# Patient Record
Sex: Male | Born: 1950 | Race: White | Hispanic: No | Smoking: Never smoker
Health system: Southern US, Community
[De-identification: ages and names within clinical notes are randomized; demographics above are authoritative.]

## PROBLEM LIST (undated history)

## (undated) DIAGNOSIS — I10 Essential (primary) hypertension: Secondary | ICD-10-CM

## (undated) DIAGNOSIS — E119 Type 2 diabetes mellitus without complications: Secondary | ICD-10-CM

## (undated) DIAGNOSIS — N429 Disorder of prostate, unspecified: Secondary | ICD-10-CM

## (undated) DIAGNOSIS — F209 Schizophrenia, unspecified: Secondary | ICD-10-CM

## (undated) DIAGNOSIS — E785 Hyperlipidemia, unspecified: Secondary | ICD-10-CM

## (undated) DIAGNOSIS — R569 Unspecified convulsions: Secondary | ICD-10-CM

## (undated) DIAGNOSIS — I5030 Unspecified diastolic (congestive) heart failure: Secondary | ICD-10-CM

---

## 2016-02-21 DIAGNOSIS — M545 Low back pain: Secondary | ICD-10-CM

## 2016-02-21 DIAGNOSIS — G8929 Other chronic pain: Secondary | ICD-10-CM | POA: Diagnosis present

## 2016-11-16 ENCOUNTER — Emergency Department (HOSPITAL_COMMUNITY): Payer: Medicare Other

## 2016-11-16 ENCOUNTER — Inpatient Hospital Stay (HOSPITAL_COMMUNITY)
Admission: EM | Admit: 2016-11-16 | Discharge: 2016-11-18 | DRG: 872 | Disposition: A | Discharge status: Home / Self Care | Payer: Medicare Other | Attending: Internal Medicine | Admitting: Internal Medicine

## 2016-11-16 ENCOUNTER — Encounter (HOSPITAL_COMMUNITY): Payer: Self-pay | Admitting: Emergency Medicine

## 2016-11-16 DIAGNOSIS — W1830XA Fall on same level, unspecified, initial encounter: Secondary | ICD-10-CM | POA: Diagnosis present

## 2016-11-16 DIAGNOSIS — Y92238 Other place in hospital as the place of occurrence of the external cause: Secondary | ICD-10-CM | POA: Diagnosis not present

## 2016-11-16 DIAGNOSIS — Z6834 Body mass index (BMI) 34.0-34.9, adult: Secondary | ICD-10-CM

## 2016-11-16 DIAGNOSIS — E1159 Type 2 diabetes mellitus with other circulatory complications: Secondary | ICD-10-CM | POA: Diagnosis present

## 2016-11-16 DIAGNOSIS — R197 Diarrhea, unspecified: Secondary | ICD-10-CM | POA: Diagnosis not present

## 2016-11-16 DIAGNOSIS — E860 Dehydration: Secondary | ICD-10-CM | POA: Diagnosis present

## 2016-11-16 DIAGNOSIS — E1140 Type 2 diabetes mellitus with diabetic neuropathy, unspecified: Secondary | ICD-10-CM | POA: Diagnosis present

## 2016-11-16 DIAGNOSIS — Z79899 Other long term (current) drug therapy: Secondary | ICD-10-CM | POA: Diagnosis not present

## 2016-11-16 DIAGNOSIS — E669 Obesity, unspecified: Secondary | ICD-10-CM

## 2016-11-16 DIAGNOSIS — R55 Syncope and collapse: Secondary | ICD-10-CM

## 2016-11-16 DIAGNOSIS — W19XXXA Unspecified fall, initial encounter: Secondary | ICD-10-CM | POA: Diagnosis not present

## 2016-11-16 DIAGNOSIS — E1169 Type 2 diabetes mellitus with other specified complication: Secondary | ICD-10-CM | POA: Diagnosis present

## 2016-11-16 DIAGNOSIS — E785 Hyperlipidemia, unspecified: Secondary | ICD-10-CM | POA: Diagnosis present

## 2016-11-16 DIAGNOSIS — Y9301 Activity, walking, marching and hiking: Secondary | ICD-10-CM | POA: Diagnosis present

## 2016-11-16 DIAGNOSIS — IMO0001 Reserved for inherently not codable concepts without codable children: Secondary | ICD-10-CM | POA: Diagnosis present

## 2016-11-16 DIAGNOSIS — R0902 Hypoxemia: Secondary | ICD-10-CM | POA: Diagnosis present

## 2016-11-16 DIAGNOSIS — G8929 Other chronic pain: Secondary | ICD-10-CM | POA: Diagnosis present

## 2016-11-16 DIAGNOSIS — E876 Hypokalemia: Secondary | ICD-10-CM | POA: Diagnosis present

## 2016-11-16 DIAGNOSIS — R2681 Unsteadiness on feet: Secondary | ICD-10-CM | POA: Diagnosis present

## 2016-11-16 DIAGNOSIS — A419 Sepsis, unspecified organism: Principal | ICD-10-CM | POA: Diagnosis present

## 2016-11-16 DIAGNOSIS — M545 Low back pain, unspecified: Secondary | ICD-10-CM | POA: Diagnosis present

## 2016-11-16 DIAGNOSIS — Z7984 Long term (current) use of oral hypoglycemic drugs: Secondary | ICD-10-CM

## 2016-11-16 DIAGNOSIS — A0811 Acute gastroenteropathy due to Norwalk agent: Secondary | ICD-10-CM | POA: Diagnosis present

## 2016-11-16 DIAGNOSIS — S2243XA Multiple fractures of ribs, bilateral, initial encounter for closed fracture: Secondary | ICD-10-CM | POA: Diagnosis present

## 2016-11-16 DIAGNOSIS — I10 Essential (primary) hypertension: Secondary | ICD-10-CM | POA: Diagnosis present

## 2016-11-16 DIAGNOSIS — Y92481 Parking lot as the place of occurrence of the external cause: Secondary | ICD-10-CM

## 2016-11-16 DIAGNOSIS — S0081XA Abrasion of other part of head, initial encounter: Secondary | ICD-10-CM | POA: Diagnosis present

## 2016-11-16 DIAGNOSIS — F209 Schizophrenia, unspecified: Secondary | ICD-10-CM | POA: Diagnosis present

## 2016-11-16 DIAGNOSIS — E118 Type 2 diabetes mellitus with unspecified complications: Secondary | ICD-10-CM

## 2016-11-16 DIAGNOSIS — A09 Infectious gastroenteritis and colitis, unspecified: Secondary | ICD-10-CM | POA: Diagnosis not present

## 2016-11-16 DIAGNOSIS — E119 Type 2 diabetes mellitus without complications: Secondary | ICD-10-CM | POA: Insufficient documentation

## 2016-11-16 DIAGNOSIS — S60221A Contusion of right hand, initial encounter: Secondary | ICD-10-CM | POA: Diagnosis present

## 2016-11-16 HISTORY — DX: Unspecified convulsions: R56.9

## 2016-11-16 HISTORY — DX: Schizophrenia, unspecified: F20.9

## 2016-11-16 HISTORY — DX: Essential (primary) hypertension: I10

## 2016-11-16 LAB — TROPONIN I
Troponin I: <0.03 ng/mL (ref <0.03)
Troponin I: <0.03 ng/mL (ref <0.03)

## 2016-11-16 LAB — APTT: APTT: 31 s (ref 24–36)

## 2016-11-16 LAB — RAPID URINE DRUG SCREEN, HOSP PERFORMED
Amphetamines: NOT DETECTED
Barbiturates: NOT DETECTED
Benzodiazepines: NOT DETECTED
Cocaine: NOT DETECTED
OPIATES: NOT DETECTED
Tetrahydrocannabinol: NOT DETECTED

## 2016-11-16 LAB — COMPREHENSIVE METABOLIC PANEL
ALBUMIN: 3.6 g/dL (ref 3.5–5.0)
ALK PHOS: 67 U/L (ref 38–126)
ALT: 37 U/L (ref 17–63)
ANION GAP: 14 (ref 5–15)
AST: 39 U/L (ref 15–41)
BILIRUBIN TOTAL: 0.6 mg/dL (ref 0.3–1.2)
BUN: 17 mg/dL (ref 6–20)
CALCIUM: 8.5 mg/dL — AB (ref 8.9–10.3)
CO2: 16 mmol/L — ABNORMAL LOW (ref 22–32)
Chloride: 105 mmol/L (ref 101–111)
Creatinine, Ser: 1.16 mg/dL (ref 0.61–1.24)
GFR calc Af Amer: >60 mL/min (ref >60)
GLUCOSE: 220 mg/dL — AB (ref 65–99)
POTASSIUM: 4.8 mmol/L (ref 3.5–5.1)
Sodium: 135 mmol/L (ref 135–145)
TOTAL PROTEIN: 6.6 g/dL (ref 6.5–8.1)

## 2016-11-16 LAB — CBC
HEMATOCRIT: 43 % (ref 39.0–52.0)
HEMOGLOBIN: 14.4 g/dL (ref 13.0–17.0)
MCH: 29.3 pg (ref 26.0–34.0)
MCHC: 33.5 g/dL (ref 30.0–36.0)
MCV: 87.4 fL (ref 78.0–100.0)
Platelets: 299 10*3/uL (ref 150–400)
RBC: 4.92 MIL/uL (ref 4.22–5.81)
RDW: 14.4 % (ref 11.5–15.5)
WBC: 21.7 10*3/uL — ABNORMAL HIGH (ref 4.0–10.5)

## 2016-11-16 LAB — INFLUENZA PANEL BY PCR (TYPE A & B)
Influenza A By PCR: NEGATIVE
Influenza B By PCR: NEGATIVE

## 2016-11-16 LAB — URINALYSIS, ROUTINE W REFLEX MICROSCOPIC
Bilirubin Urine: NEGATIVE
Glucose, UA: 150 mg/dL — AB
HGB URINE DIPSTICK: NEGATIVE
KETONES UR: 5 mg/dL — AB
Leukocytes, UA: NEGATIVE
Nitrite: NEGATIVE
PROTEIN: NEGATIVE mg/dL
Specific Gravity, Urine: 1.023 (ref 1.005–1.030)
pH: 5 (ref 5.0–8.0)

## 2016-11-16 LAB — I-STAT CG4 LACTIC ACID, ED
LACTIC ACID, VENOUS: 2.79 mmol/L — AB (ref 0.5–1.9)
Lactic Acid, Venous: 5.32 mmol/L (ref 0.5–1.9)

## 2016-11-16 LAB — LACTIC ACID, PLASMA
LACTIC ACID, VENOUS: 2.8 mmol/L — AB (ref 0.5–1.9)
LACTIC ACID, VENOUS: 2.9 mmol/L — AB (ref 0.5–1.9)

## 2016-11-16 LAB — TSH: TSH: 1.453 u[IU]/mL (ref 0.350–4.500)

## 2016-11-16 LAB — C DIFFICILE QUICK SCREEN W PCR REFLEX
C DIFFICILE (CDIFF) INTERP: NOT DETECTED
C DIFFICILE (CDIFF) TOXIN: NEGATIVE
C DIFFICLE (CDIFF) ANTIGEN: NEGATIVE

## 2016-11-16 LAB — PHENYTOIN LEVEL, TOTAL

## 2016-11-16 LAB — MRSA PCR SCREENING: MRSA by PCR: NEGATIVE

## 2016-11-16 LAB — PROTIME-INR
INR: 1.15
PROTHROMBIN TIME: 14.7 s (ref 11.4–15.2)

## 2016-11-16 LAB — PROCALCITONIN: PROCALCITONIN: 0.54 ng/mL

## 2016-11-16 LAB — CBG MONITORING, ED: GLUCOSE-CAPILLARY: 150 mg/dL — AB (ref 65–99)

## 2016-11-16 MED ORDER — SENNOSIDES-DOCUSATE SODIUM 8.6-50 MG PO TABS: 1.0000 | ORAL_TABLET | Freq: Every evening | ORAL | Status: DC | PRN

## 2016-11-16 MED ORDER — FLEET ENEMA 7-19 GM/118ML RE ENEM: 1.0000 | ENEMA | Freq: Once | RECTAL | Status: DC | PRN

## 2016-11-16 MED ORDER — SODIUM CHLORIDE 0.9 % IV BOLUS (SEPSIS)
1000.0000 mL | Freq: Once | INTRAVENOUS | Status: AC
  Administered 2016-11-16: 1000 mL via INTRAVENOUS

## 2016-11-16 MED ORDER — BENZTROPINE MESYLATE 0.5 MG PO TABS
0.5000 mg | ORAL_TABLET | Freq: Every day | ORAL | Status: DC
  Administered 2016-11-17 – 2016-11-18 (×2): 0.5 mg via ORAL
  Filled 2016-11-16 (×2): qty 1

## 2016-11-16 MED ORDER — ACETAMINOPHEN 650 MG RE SUPP: 650.0000 mg | Freq: Four times a day (QID) | RECTAL | Status: DC | PRN

## 2016-11-16 MED ORDER — VANCOMYCIN HCL 10 G IV SOLR
2000.0000 mg | Freq: Once | INTRAVENOUS | Status: DC
  Filled 2016-11-16: qty 2000

## 2016-11-16 MED ORDER — ACETAMINOPHEN 325 MG PO TABS: 650.0000 mg | ORAL_TABLET | Freq: Four times a day (QID) | ORAL | Status: DC | PRN

## 2016-11-16 MED ORDER — HYDROMORPHONE HCL 2 MG/ML IJ SOLN: 1.0000 mg | INTRAMUSCULAR | Status: DC | PRN

## 2016-11-16 MED ORDER — ONDANSETRON HCL 4 MG/2ML IJ SOLN: 4.0000 mg | Freq: Four times a day (QID) | INTRAMUSCULAR | Status: DC | PRN

## 2016-11-16 MED ORDER — ATORVASTATIN CALCIUM 20 MG PO TABS
20.0000 mg | ORAL_TABLET | Freq: Every day | ORAL | Status: DC
  Administered 2016-11-17 – 2016-11-18 (×2): 20 mg via ORAL
  Filled 2016-11-16 (×2): qty 1

## 2016-11-16 MED ORDER — VANCOMYCIN HCL IN DEXTROSE 1-5 GM/200ML-% IV SOLN
1000.0000 mg | Freq: Once | INTRAVENOUS | Status: AC
  Administered 2016-11-16: 1000 mg via INTRAVENOUS
  Filled 2016-11-16: qty 200

## 2016-11-16 MED ORDER — HYDROMORPHONE HCL 1 MG/ML IJ SOLN: 1.0000 mg | INTRAMUSCULAR | Status: DC | PRN

## 2016-11-16 MED ORDER — GABAPENTIN 300 MG PO CAPS
300.0000 mg | ORAL_CAPSULE | Freq: Three times a day (TID) | ORAL | Status: DC
  Administered 2016-11-16 – 2016-11-18 (×7): 300 mg via ORAL
  Filled 2016-11-16 (×7): qty 1

## 2016-11-16 MED ORDER — VANCOMYCIN HCL IN DEXTROSE 1-5 GM/200ML-% IV SOLN: 1000.0000 mg | Freq: Once | INTRAVENOUS | Status: DC

## 2016-11-16 MED ORDER — BACID PO TABS
2.0000 | ORAL_TABLET | Freq: Three times a day (TID) | ORAL | Status: DC
  Administered 2016-11-16 – 2016-11-18 (×6): 2 via ORAL
  Filled 2016-11-16 (×7): qty 2

## 2016-11-16 MED ORDER — LISINOPRIL 20 MG PO TABS: 40.0000 mg | ORAL_TABLET | Freq: Every day | ORAL | Status: DC

## 2016-11-16 MED ORDER — ENOXAPARIN SODIUM 40 MG/0.4ML ~~LOC~~ SOLN
40.0000 mg | SUBCUTANEOUS | Status: DC
  Administered 2016-11-17 – 2016-11-18 (×2): 40 mg via SUBCUTANEOUS
  Filled 2016-11-16 (×3): qty 0.4

## 2016-11-16 MED ORDER — AMLODIPINE BESYLATE 5 MG PO TABS: 10.0000 mg | ORAL_TABLET | Freq: Every day | ORAL | Status: DC

## 2016-11-16 MED ORDER — ALUM & MAG HYDROXIDE-SIMETH 200-200-20 MG/5ML PO SUSP: 30.0000 mL | Freq: Four times a day (QID) | ORAL | Status: DC | PRN

## 2016-11-16 MED ORDER — BISACODYL 5 MG PO TBEC: 5.0000 mg | DELAYED_RELEASE_TABLET | Freq: Every day | ORAL | Status: DC | PRN

## 2016-11-16 MED ORDER — BENZTROPINE MESYLATE 1 MG PO TABS: 0.5000 mg | ORAL_TABLET | Freq: Every day | ORAL | Status: DC

## 2016-11-16 MED ORDER — SODIUM CHLORIDE 0.9 % IV BOLUS (SEPSIS): 1000.0000 mL | Freq: Once | INTRAVENOUS | Status: DC

## 2016-11-16 MED ORDER — ONDANSETRON HCL 4 MG PO TABS: 4.0000 mg | ORAL_TABLET | Freq: Four times a day (QID) | ORAL | Status: DC | PRN

## 2016-11-16 MED ORDER — FLUPHENAZINE HCL 5 MG PO TABS
5.0000 mg | ORAL_TABLET | Freq: Every day | ORAL | Status: DC
  Administered 2016-11-16 – 2016-11-17 (×2): 5 mg via ORAL
  Filled 2016-11-16 (×3): qty 1

## 2016-11-16 MED ORDER — PIPERACILLIN-TAZOBACTAM 3.375 G IVPB 30 MIN: 3.3750 g | Freq: Once | INTRAVENOUS | Status: DC

## 2016-11-16 MED ORDER — HYDROCODONE-ACETAMINOPHEN 5-325 MG PO TABS: 1.0000 | ORAL_TABLET | ORAL | Status: DC | PRN

## 2016-11-16 MED ORDER — PIPERACILLIN-TAZOBACTAM 3.375 G IVPB
3.3750 g | Freq: Three times a day (TID) | INTRAVENOUS | Status: DC
  Administered 2016-11-17 (×3): 3.375 g via INTRAVENOUS
  Filled 2016-11-16 (×4): qty 50

## 2016-11-16 MED ORDER — VANCOMYCIN HCL IN DEXTROSE 750-5 MG/150ML-% IV SOLN
750.0000 mg | Freq: Two times a day (BID) | INTRAVENOUS | Status: DC
  Administered 2016-11-16 – 2016-11-17 (×2): 750 mg via INTRAVENOUS
  Filled 2016-11-16 (×3): qty 150

## 2016-11-16 MED ORDER — SODIUM CHLORIDE 0.9 % IV BOLUS (SEPSIS): 500.0000 mL | Freq: Once | INTRAVENOUS | Status: DC

## 2016-11-16 MED ORDER — PIPERACILLIN-TAZOBACTAM 3.375 G IVPB: 3.3750 g | Freq: Three times a day (TID) | INTRAVENOUS | Status: DC

## 2016-11-16 MED ORDER — TRAZODONE HCL 50 MG PO TABS
100.0000 mg | ORAL_TABLET | Freq: Every day | ORAL | Status: DC
  Administered 2016-11-16 – 2016-11-17 (×2): 100 mg via ORAL
  Filled 2016-11-16 (×2): qty 2

## 2016-11-16 MED ORDER — LISINOPRIL 20 MG PO TABS
40.0000 mg | ORAL_TABLET | Freq: Every day | ORAL | Status: DC
  Administered 2016-11-17 – 2016-11-18 (×2): 40 mg via ORAL
  Filled 2016-11-16 (×2): qty 2

## 2016-11-16 MED ORDER — AMLODIPINE BESYLATE 10 MG PO TABS
10.0000 mg | ORAL_TABLET | Freq: Every day | ORAL | Status: DC
  Administered 2016-11-17 – 2016-11-18 (×2): 10 mg via ORAL
  Filled 2016-11-16 (×2): qty 1

## 2016-11-16 MED ORDER — PIPERACILLIN-TAZOBACTAM 3.375 G IVPB
3.3750 g | Freq: Once | INTRAVENOUS | Status: AC
  Administered 2016-11-16: 3.375 g via INTRAVENOUS
  Filled 2016-11-16: qty 50

## 2016-11-16 MED ORDER — SODIUM CHLORIDE 0.9 % IV BOLUS (SEPSIS)
500.0000 mL | Freq: Once | INTRAVENOUS | Status: AC
  Administered 2016-11-16: 500 mL via INTRAVENOUS

## 2016-11-16 MED ORDER — INSULIN ASPART 100 UNIT/ML ~~LOC~~ SOLN
0.0000 [IU] | Freq: Three times a day (TID) | SUBCUTANEOUS | Status: DC
  Administered 2016-11-16: 1 [IU] via SUBCUTANEOUS
  Administered 2016-11-17 (×2): 2 [IU] via SUBCUTANEOUS
  Administered 2016-11-17: 1 [IU] via SUBCUTANEOUS
  Administered 2016-11-18: 3 [IU] via SUBCUTANEOUS
  Administered 2016-11-18: 5 [IU] via SUBCUTANEOUS
  Filled 2016-11-16: qty 1

## 2016-11-16 NOTE — ED Notes (Signed)
Dinner tray ordered.

## 2016-11-16 NOTE — H&P (Signed)
History and Physical    Jaime Lopez ZOX:[id number] DOB: 1950-11-14 DOA: 11/16/2016   PCP: Jolene Provost, MD   Patient coming from:  Home    Chief Complaint: Dizziness, Fall   HPI: Jaime Lopez is a 66 y.o. male  with a history of hypertension, with a history of hypertension hyperlipidemia, schizophrenia, remote history of seizures 10 years ago not on medication, brought via EMS after sustaining a fault. In review, she was walking across the parking lot at the grocery store, when he reported becoming dizzy with vertigo and falling on his right side of the face. The patient recalls the moments prior to falling, not the time spent on the ground, which may have been around 1 minute. This was witnessed by his brother. He denies any similar episodes in the past. He has never been seen by cardiology, or had any cardiac catheterization or echocardiogram. He denies any headache, vision changes, Denies nasal congestion, call for sore throat. He denies any vomiting. He denies any subjective fevers. Brother reports that in his living facility, it has been an outbreak of [place], unknown if viral or bacterial. Patient himself had 4 episodes of diarrhea, non bloody, without further episodes in the ED. Denies dysuria or gross hematuria. Denies any recent illness. He is compliant with his meds. Appetite and liquid intake had been decreased over the last 48 hrs. NO recent long distance trips. No recreational drugs.    ED Course:  BP 145/70   Pulse (!) 123   Temp 100.8 F (38.2 C) (Oral)   Resp (!) 27   Ht 5\' 10"  (1.778 m)   Wt 108 kg (238 lb)   SpO2 93%   BMI 34.15 kg/m    On presentation , Rectal temperature,  100.8 F. He was also noted to be tachycardic at 128 to 130 bpm. Because of this findings, he was placed on sepsis protocol.  Lactic acid was 5.32.  He was very dehydrated. He received up to 2 L of IV normal saline, and after cultures were obtained, he was placed on Vanc and Zosyn . Sunday  135 potassium 4.8 creatinine 1.16 glucose 220, Anion Gap 14  bilirubin 0.6 l  white count 21.7 hemoglobin 14.4 platelets 299  Dilantin level is less than 2.5 glucose 220 CXR NAD CT head negative for intracranial findings.   Review of Systems: As per HPI otherwise 10 point review of systems negative.   Past Medical History:  Diagnosis Date  . Hypertension   . Schizophrenia (HCC)   . Seizures (HCC)     No past surgical history on file.  Social History Social History   Social History  . Marital status: Unknown    Spouse name: N/A  . Number of children: N/A  . Years of education: N/A   Occupational History  . Not on file.   Social History Main Topics  . Smoking status: Never Smoker  . Smokeless tobacco: Never Used  . Alcohol use No  . Drug use: Unknown  . Sexual activity: Not on file   Other Topics Concern  . Not on file   Social History Narrative  . No narrative on file     No Known Allergies  No family history on file.    Prior to Admission medications   Medication Sig Start Date End Date Taking? Authorizing Provider  amLODipine (NORVASC) 10 MG tablet Take 10 mg by mouth daily.   Yes Historical Provider, MD  atorvastatin (LIPITOR) 20 MG tablet Take 20 mg  by mouth daily.   Yes Historical Provider, MD  benztropine (COGENTIN) 1 MG tablet Take 0.5 mg by mouth daily.   Yes Historical Provider, MD  fluPHENAZine (PROLIXIN) 5 MG tablet Take 5 mg by mouth at bedtime.   Yes Historical Provider, MD  gabapentin (NEURONTIN) 300 MG capsule Take 300 mg by mouth 3 (three) times daily.   Yes Historical Provider, MD  lisinopril (PRINIVIL,ZESTRIL) 40 MG tablet Take 40 mg by mouth daily.   Yes Historical Provider, MD  sitaGLIPtin-metformin (JANUMET) 50-1000 MG tablet Take 1 tablet by mouth 2 (two) times daily with a meal.   Yes Historical Provider, MD  traZODone (DESYREL) 100 MG tablet Take 100 mg by mouth at bedtime.   Yes Historical Provider, MD    Physical Exam:  Vitals:     11/16/16 1500 11/16/16 1515 11/16/16 1530 11/16/16 1600  BP: 114/67 127/60 128/69 145/70  Pulse: (!) 121 118 (!) 122 (!) 123  Resp: (!) 29 (!) 30 25 (!) 27  Temp:      TempSrc:      SpO2: 94% 94% 93% 93%  Weight:      Height:       Constitutional: NAD, calm, comfortable, flat affect, few abrasions on the R temporal and facial area.  Eyes: PERRL, lids and conjunctivae normal ENMT: Mucous membranes are moist, without exudate or lesions  Neck: normal, supple, no masses, no thyromegaly Respiratory: clear to auscultation bilaterally, no wheezing, no crackles. Normal respiratory effort  Cardiovascular:  Tachy and rhythm, no murmurs / rubs / gallops. No extremity edema. 2+ pedal pulses. No carotid bruits.  Abdomen:  ObeseSoft, non tender, No hepatosplenomegaly. Bowel sounds positive, somewhat active .  Musculoskeletal: no clubbing / cyanosis. Moves all extremities Skin: no jaundice few abrasions on the R temporal and facial area, as well as in the R hand.  Neurologic: Sensation intact  Strength normal in all extremities  Psychiatric:   Alert and oriented x 3. Flat affect    Labs on Admission: I have personally reviewed following labs and imaging studies  CBC:  Recent Labs Lab 11/16/16 1244  WBC 21.7*  HGB 14.4  HCT 43.0  MCV 87.4  PLT 299    Basic Metabolic Panel:  Recent Labs Lab 11/16/16 1244  NA 135  K 4.8  CL 105  CO2 16*  GLUCOSE 220*  BUN 17  CREATININE 1.16  CALCIUM 8.5*    GFR: Estimated Creatinine Clearance: 79.2 mL/min (by C-G formula based on SCr of 1.16 mg/dL).  Liver Function Tests:  Recent Labs Lab 11/16/16 1244  AST 39  ALT 37  ALKPHOS 67  BILITOT 0.6  PROT 6.6  ALBUMIN 3.6   No results for input(s): LIPASE, AMYLASE in the last 168 hours. No results for input(s): AMMONIA in the last 168 hours.  Coagulation Profile: No results for input(s): INR, PROTIME in the last 168 hours.  Cardiac Enzymes: No results for input(s): CKTOTAL, CKMB,  CKMBINDEX, TROPONINI in the last 168 hours.  BNP (last 3 results) No results for input(s): PROBNP in the last 8760 hours.  HbA1C: No results for input(s): HGBA1C in the last 72 hours.  CBG: No results for input(s): GLUCAP in the last 168 hours.  Lipid Profile: No results for input(s): CHOL, HDL, LDLCALC, TRIG, CHOLHDL, LDLDIRECT in the last 72 hours.  Thyroid Function Tests: No results for input(s): TSH, T4TOTAL, FREET4, T3FREE, THYROIDAB in the last 72 hours.  Anemia Panel: No results for input(s): VITAMINB12, FOLATE, FERRITIN, TIBC, IRON, RETICCTPCT  in the last 72 hours.  Urine analysis: No results found for: COLORURINE, APPEARANCEUR, LABSPEC, PHURINE, GLUCOSEU, HGBUR, BILIRUBINUR, KETONESUR, PROTEINUR, UROBILINOGEN, NITRITE, LEUKOCYTESUR  Sepsis Labs: @LABRCNTIP (procalcitonin:4,lacticidven:4) )No results found for this or any previous visit (from the past 240 hour(s)).   Radiological Exams on Admission: Dg Chest 2 View  Result Date: 11/16/2016 CLINICAL DATA:  Pt reports that he suffers from vertigo and had a dizzy spell while walking into [employer] earlier today causing him to fall on his face onto the pavement. No chest complaints right now. Pt has facial abrasions and abrasions EXAM: CHEST  2 VIEW COMPARISON:  None. FINDINGS: Normal mediastinum and cardiac silhouette. Normal pulmonary vasculature. No evidence of effusion, infiltrate, or pneumothorax. No acute bony abnormality. Continuous osteophytosis of the thoracic spine. IMPRESSION: No acute cardiopulmonary process. Electronically Signed   By: Genevive Bi M.D.   On: 11/16/2016 13:32   Ct Head Wo Contrast  Result Date: 11/16/2016 CLINICAL DATA:  Status post fall, confusion, loss of consciousness. EXAM: CT HEAD WITHOUT CONTRAST CT MAXILLOFACIAL WITHOUT CONTRAST TECHNIQUE: Multidetector CT imaging of the head and maxillofacial structures were performed using the standard protocol without intravenous contrast.  Multiplanar CT image reconstructions of the maxillofacial structures were also generated. COMPARISON:  None. FINDINGS: CT HEAD FINDINGS Brain: No evidence of acute infarction, hemorrhage, extra-axial collection, ventriculomegaly, or mass effect. Generalized cerebral atrophy. Periventricular white matter low attenuation likely secondary to microangiopathy. Vascular: Cerebrovascular atherosclerotic calcifications are noted. Skull: Negative for fracture or focal lesion. Sinuses/Orbits: Visualized portions of the orbits are unremarkable. Mastoid sinuses are clear. Bilateral maxillary sinus mucosal thickening. Other: None. CT MAXILLOFACIAL FINDINGS Osseous: No fracture or mandibular dislocation. No destructive process. Orbits: Negative. No traumatic or inflammatory finding. Sinuses: Bilateral maxillary sinus mucosal thickening. No paranasal sinus air-fluid level. Mastoid sinuses are clear. Soft tissues: Negative. Other:  Periapical lucency of the second left maxillary molar. IMPRESSION: 1. No acute intracranial pathology. 2. No acute osseous injury of the maxillofacial bones. Electronically Signed   By: Elige Ko   On: 11/16/2016 13:20   Ct Maxillofacial Wo Contrast  Result Date: 11/16/2016 CLINICAL DATA:  Status post fall, confusion, loss of consciousness. EXAM: CT HEAD WITHOUT CONTRAST CT MAXILLOFACIAL WITHOUT CONTRAST TECHNIQUE: Multidetector CT imaging of the head and maxillofacial structures were performed using the standard protocol without intravenous contrast. Multiplanar CT image reconstructions of the maxillofacial structures were also generated. COMPARISON:  None. FINDINGS: CT HEAD FINDINGS Brain: No evidence of acute infarction, hemorrhage, extra-axial collection, ventriculomegaly, or mass effect. Generalized cerebral atrophy. Periventricular white matter low attenuation likely secondary to microangiopathy. Vascular: Cerebrovascular atherosclerotic calcifications are noted. Skull: Negative for fracture  or focal lesion. Sinuses/Orbits: Visualized portions of the orbits are unremarkable. Mastoid sinuses are clear. Bilateral maxillary sinus mucosal thickening. Other: None. CT MAXILLOFACIAL FINDINGS Osseous: No fracture or mandibular dislocation. No destructive process. Orbits: Negative. No traumatic or inflammatory finding. Sinuses: Bilateral maxillary sinus mucosal thickening. No paranasal sinus air-fluid level. Mastoid sinuses are clear. Soft tissues: Negative. Other:  Periapical lucency of the second left maxillary molar. IMPRESSION: 1. No acute intracranial pathology. 2. No acute osseous injury of the maxillofacial bones. Electronically Signed   By: Elige Ko   On: 11/16/2016 13:20    EKG: Independently reviewed.  Assessment/Plan Active Problems:   Sepsis (HCC)   Diarrhea   Chronic bilateral low back pain without sciatica   Hyperlipemia   Hypertension   Obesity, Class II, BMI 35-39.9, with comorbidity   Schizophrenia (HCC)   Syncope  Sepsis likely due to GI source versus other etiology, organism unknown. + GI illness outbreak in his residence Patient meets criteria given tachycardia, tachypnea, fever up to 100.8, leukocytosis 21, and evidence of organ dysfunction. Lactic acid 5.2. QSOFA 1   Antibiotics delivered in the ED with Vanc and Zosyn . UA pending, Initial Lactic acid 32.  EKG Sinus Tach,  Received   2 l NS to date . Cdif and stool cultures pending. ETOH and UDS  pending  Admit to SDU Sepsis order set  IV antibiotics by pharmacy with Vanc and Zosyn  Follow serial lactic acid , blood and urine cultures Continue IV fluid bolus with close monitor of his lactic acid, then can switch to 100cc/h after 3rd liter Procalcitonin order set  Influenza panel  Probiotics Repeat CBC in am     Syncope in the setting of infection, tachycardia at the time of the event, severe dehydration. Unlikely due to seizure but will continue to monitor. CT head negative. Dilantin level less than 2.5,  patient not on seizure meds.   EKG ST  Neuro exam unremarkable Never had a cardiac evaluation Syncope order set  -2 D echo TSH  IV fluids EKG in am If new seizure activity presents, will consult Neurology otherwise, patient can follow as OP with Neuro   Type II Diabetes with neuropathy Current blood sugar level is int he 200s No results found for: HGBA1C Hgb A1C Hold home oral diabetic medications.   SSI Heart healthy carb modified diet. Continue Neurontin   Hypertension BP 127/60   Pulse 118   BP Controlled, pulse elevated in the setting of sepsis and dehydration, expected to improved with IVF.   Resume home anti-hypertensive medications with Norvasc and Lisinopril in am   Hyperlipidemia Continue home statins  Schizophrenia Continue Prolixin and Cogentin  Patient to follow as OP with Psychiatrist      DVT prophylaxis: Lovenox  Code Status:   Full    Family Communication:  Discussed with patient Disposition Plan: Expect patient to be discharged to home after condition improves Consults called:    None Admission status:  SDU    Jaime Selbe E, PA-C [facility]   11/16/2016, 4:30 PM

## 2016-11-16 NOTE — ED Notes (Signed)
Patient transported to X-ray 

## 2016-11-16 NOTE — ED Notes (Signed)
RN notified of elevated temp

## 2016-11-16 NOTE — ED Provider Notes (Addendum)
[facility]-EMERGENCY DEPT Provider Note   CSN: [id number] Arrival date & time: 11/16/16  1145     History   Chief Complaint Chief Complaint  Patient presents with  . Fall  . Head Injury  . Hand Injury    HPI Jaime Lopez is a 66 y.o. male.   HPI Jaime Lopez is a 66 year old man with a history of hypertension, schizophrenia, and seizures who presents today via EMS after a fall. Walking across a parking lot when he became dizzy with vertigo and "face planted."  Patient is unable to tell me he has medical history initially or his medications. Further information was obtained through care everywhere review of his [facility] chart. His brother arrived later and I was able to get some more history from him. During my initial evaluation estimates the patient had stool on his clothing. He was initially evaluated in the hallway. Upon direct questioning about the stool on his clothing he states he has had some diarrhea today. He was well last night. He specifically denied headache, nasal congestion, cough, sore throat, or vomiting. He does not know whether or not he has had a fever.  Past Medical History:  Diagnosis Date  . Hypertension   . Schizophrenia (HCC)   . Seizures (HCC)     There are no active problems to display for this patient.   No past surgical history on file.     Home Medications    Prior to Admission medications   Medication Sig Start Date End Date Taking? Authorizing Provider  amLODipine (NORVASC) 10 MG tablet Take 10 mg by mouth daily.   Yes Historical Provider, MD  atorvastatin (LIPITOR) 20 MG tablet Take 20 mg by mouth daily.   Yes Historical Provider, MD  benztropine (COGENTIN) 1 MG tablet Take 0.5 mg by mouth daily.   Yes Historical Provider, MD  fluPHENAZine (PROLIXIN) 5 MG tablet Take 5 mg by mouth at bedtime.   Yes Historical Provider, MD  gabapentin (NEURONTIN) 300 MG capsule Take 300 mg by mouth 3 (three) times daily.   Yes Historical Provider, MD    lisinopril (PRINIVIL,ZESTRIL) 40 MG tablet Take 40 mg by mouth daily.   Yes Historical Provider, MD  sitaGLIPtin-metformin (JANUMET) 50-1000 MG tablet Take 1 tablet by mouth 2 (two) times daily with a meal.   Yes Historical Provider, MD  traZODone (DESYREL) 100 MG tablet Take 100 mg by mouth at bedtime.   Yes Historical Provider, MD    Family History No family history on file.  Social History Social History  Substance Use Topics  . Smoking status: Never Smoker  . Smokeless tobacco: Never Used  . Alcohol use No     Allergies   Patient has no known allergies.   Review of Systems Review of Systems   Physical Exam Updated Vital Signs BP 116/66 (BP Location: Right Arm)   Pulse (!) 128   Temp 100.8 F (38.2 C) (Oral)   Resp (!) 27   Ht 5\' 10"  (1.778 m)   Wt 108 kg   SpO2 92%   BMI 34.15 kg/m   Physical Exam  Constitutional: He is oriented to person, place, and time. He appears well-developed and well-nourished.  Unkempt with stool on clothing  HENT:  Right facial abrasion  Eyes: EOM are normal. Pupils are equal, round, and reactive to light.  Neck: Normal range of motion.  Cardiovascular: Tachycardia present.   Pulmonary/Chest: Effort normal and breath sounds normal. No respiratory distress. He has no wheezes.  Abdominal: Soft. Bowel sounds are normal.  Musculoskeletal: He exhibits no edema or deformity.  Neurological: He is alert and oriented to person, place, and time. A cranial nerve deficit is present. Coordination normal.  Skin: Skin is warm.  Nursing note and vitals reviewed.  This is a late entry  ED Treatments / Results  Labs (all labs ordered are listed, but only abnormal results are displayed) Labs Reviewed  PHENYTOIN LEVEL, TOTAL - Abnormal; Notable for the following:       Result Value   Phenytoin Lvl <2.5 (*)    All other components within normal limits  CBC - Abnormal; Notable for the following:    WBC 21.7 (*)    All other components within  normal limits  COMPREHENSIVE METABOLIC PANEL - Abnormal; Notable for the following:    CO2 16 (*)    Glucose, Bld 220 (*)    Calcium 8.5 (*)    All other components within normal limits  I-STAT CG4 LACTIC ACID, ED - Abnormal; Notable for the following:    Lactic Acid, Venous 5.32 (*)    All other components within normal limits  CULTURE, BLOOD (ROUTINE X 2)  CULTURE, BLOOD (ROUTINE X 2)  GASTROINTESTINAL PANEL BY PCR, STOOL (REPLACES STOOL CULTURE)  URINALYSIS, ROUTINE W REFLEX MICROSCOPIC  RAPID URINE DRUG SCREEN, HOSP PERFORMED  ETHANOL    EKG  EKG Interpretation None       Radiology Dg Chest 2 View  Result Date: 11/16/2016 CLINICAL DATA:  Pt reports that he suffers from vertigo and had a dizzy spell while walking into [employer] earlier today causing him to fall on his face onto the pavement. No chest complaints right now. Pt has facial abrasions and abrasions EXAM: CHEST  2 VIEW COMPARISON:  None. FINDINGS: Normal mediastinum and cardiac silhouette. Normal pulmonary vasculature. No evidence of effusion, infiltrate, or pneumothorax. No acute bony abnormality. Continuous osteophytosis of the thoracic spine. IMPRESSION: No acute cardiopulmonary process. Electronically Signed   By: Genevive Bi M.D.   On: 11/16/2016 13:32   Ct Head Wo Contrast  Result Date: 11/16/2016 CLINICAL DATA:  Status post fall, confusion, loss of consciousness. EXAM: CT HEAD WITHOUT CONTRAST CT MAXILLOFACIAL WITHOUT CONTRAST TECHNIQUE: Multidetector CT imaging of the head and maxillofacial structures were performed using the standard protocol without intravenous contrast. Multiplanar CT image reconstructions of the maxillofacial structures were also generated. COMPARISON:  None. FINDINGS: CT HEAD FINDINGS Brain: No evidence of acute infarction, hemorrhage, extra-axial collection, ventriculomegaly, or mass effect. Generalized cerebral atrophy. Periventricular white matter low attenuation likely secondary  to microangiopathy. Vascular: Cerebrovascular atherosclerotic calcifications are noted. Skull: Negative for fracture or focal lesion. Sinuses/Orbits: Visualized portions of the orbits are unremarkable. Mastoid sinuses are clear. Bilateral maxillary sinus mucosal thickening. Other: None. CT MAXILLOFACIAL FINDINGS Osseous: No fracture or mandibular dislocation. No destructive process. Orbits: Negative. No traumatic or inflammatory finding. Sinuses: Bilateral maxillary sinus mucosal thickening. No paranasal sinus air-fluid level. Mastoid sinuses are clear. Soft tissues: Negative. Other:  Periapical lucency of the second left maxillary molar. IMPRESSION: 1. No acute intracranial pathology. 2. No acute osseous injury of the maxillofacial bones. Electronically Signed   By: Elige Ko   On: 11/16/2016 13:20   Ct Maxillofacial Wo Contrast  Result Date: 11/16/2016 CLINICAL DATA:  Status post fall, confusion, loss of consciousness. EXAM: CT HEAD WITHOUT CONTRAST CT MAXILLOFACIAL WITHOUT CONTRAST TECHNIQUE: Multidetector CT imaging of the head and maxillofacial structures were performed using the standard protocol without intravenous contrast. Multiplanar CT image  reconstructions of the maxillofacial structures were also generated. COMPARISON:  None. FINDINGS: CT HEAD FINDINGS Brain: No evidence of acute infarction, hemorrhage, extra-axial collection, ventriculomegaly, or mass effect. Generalized cerebral atrophy. Periventricular white matter low attenuation likely secondary to microangiopathy. Vascular: Cerebrovascular atherosclerotic calcifications are noted. Skull: Negative for fracture or focal lesion. Sinuses/Orbits: Visualized portions of the orbits are unremarkable. Mastoid sinuses are clear. Bilateral maxillary sinus mucosal thickening. Other: None. CT MAXILLOFACIAL FINDINGS Osseous: No fracture or mandibular dislocation. No destructive process. Orbits: Negative. No traumatic or inflammatory finding. Sinuses:  Bilateral maxillary sinus mucosal thickening. No paranasal sinus air-fluid level. Mastoid sinuses are clear. Soft tissues: Negative. Other:  Periapical lucency of the second left maxillary molar. IMPRESSION: 1. No acute intracranial pathology. 2. No acute osseous injury of the maxillofacial bones. Electronically Signed   By: Elige Ko   On: 11/16/2016 13:20    Procedures Procedures (including critical care time)  Medications Ordered in ED Medications  sodium chloride 0.9 % bolus 1,000 mL (1,000 mLs Intravenous New Bag/Given 11/16/16 1358)  sodium chloride 0.9 % bolus 1,000 mL (not administered)    And  sodium chloride 0.9 % bolus 1,000 mL (not administered)    And  sodium chloride 0.9 % bolus 1,000 mL (not administered)    And  sodium chloride 0.9 % bolus 500 mL (not administered)  piperacillin-tazobactam (ZOSYN) IVPB 3.375 g (not administered)  vancomycin (VANCOCIN) IVPB 1000 mg/200 mL premix (not administered)     Initial Impression / Assessment and Plan / ED Course  I have reviewed the triage vital signs and the nursing notes.  Pertinent labs & imaging results that were available during my care of the patient were reviewed by me and considered in my medical decision making (see chart for details).   nursing obtained a rectal temperature is elevated at 100.8. He is also noted to be tachycardic to 128. After these abnormal vital signs are noted, patient was started on sepsis protocol. IV normal saline is ordered. Given his elevated lactic acid at 5.3, Zosyn and Vanco are ordered. However, his brother reports that the mother's facility has had an outbreak of a GI illness. They have both visited, but the mother herself has not been ill.   Discussed with critical care and they advise patient to be admitted to medicine.  Discussed with Ms. Wertman Sepsis - Repeat Assessment  Performed at:    3:20 PM  Vitals     Blood pressure 127/60, pulse 118, temperature 100.8 F (38.2 C),  temperature source Oral, resp. rate (!) 30, height 5\' 10"  (1.778 m), weight 108 kg, SpO2 94 %.  Heart:     Tachycardic  Lungs:    CTA  Capillary Refill:   <2 sec  Peripheral Pulse:   Radial pulse palpable  Skin:     Pale    Final Clinical Impressions(s) / ED Diagnoses   Final diagnoses:  Sepsis, due to unspecified organism [facility])  Syncope, unspecified syncope type  Abrasion of face, initial encounter  Contusion of right hand, initial encounter    New Prescriptions New Prescriptions   No medications on file     Margarita Grizzle, MD 11/16/16 1519    Margarita Grizzle, MD 11/16/16 1521    Margarita Grizzle, MD 11/25/16 1555    Margarita Grizzle, MD 01/10/17 1423

## 2016-11-16 NOTE — Progress Notes (Addendum)
Pharmacy Antibiotic Note  Jaime Lopez is a 66 y.o. male admitted on 11/16/2016 with sepsis.  Pharmacy has been consulted for vancomycin and zosyn dosing.  Wt 108 kg. WBC 21.7, creat 1.16. Temp 100.8, normalized creat cl 65 ml/min- may improve with hydration.     Plan: vancomycin 2 gm loading dose and zosyn 3.375 gm over 30 minutes initial infusion Vancomycin 1000 mg IV every 12 hours.  Goal trough 15-20 mcg/mL. Zosyn 3.375g IV q8h (4 hour infusion).  F/u renal fxn, wbc, temp, culture data vanc levels as needed ADDENDUM: vanc/zosyn dc'd by Dr Rosalia Hammersay  Height: 5\' 10"  (177.8 cm) Weight: 238 lb (108 kg) IBW/kg (Calculated) : 73  Temp (24hrs), Avg:99.9 F (37.7 C), Min:99 F (37.2 C), Max:100.8 F (38.2 C)   Recent Labs Lab 11/16/16 1244  WBC 21.7*  CREATININE 1.16    Estimated Creatinine Clearance: 79.2 mL/min (by C-G formula based on SCr of 1.16 mg/dL).    No Known Allergies  Herby AbrahamMichelle T. Medford Staheli, Pharm.D. [phone] 11/16/2016 2:16 PM

## 2016-11-16 NOTE — ED Notes (Signed)
Tech came to another pt room to inform this nurse that pt had to go to the bathroom and wanted to know what's the best way for pt go. Informed tech that pt should be placed on bedpan. Tech reports as she was putting on protective gown due to pt being on enteric precautions, she heard pt hit the floor. Another nurse came to me in a different pt room to inform me that pt fell. PA Dahlia ClientBrowning came to bedside to assess pt. Pt denied pain and was assisted back in bed and placed on bedpan. Pt instructed to not get out of bed without assistance.

## 2016-11-16 NOTE — ED Triage Notes (Signed)
[facility] with c/o witnessed fall in parking lot with + LOC for a minute. Pt has facial abrasions and abrasions to right hand and forearm area. Pt did have + bowel incontinence.

## 2016-11-16 NOTE — ED Notes (Signed)
Attempted to obtain urine specimen; Pt unable to provide one at this time 

## 2016-11-16 NOTE — ED Notes (Signed)
Ray MD notified of lab results

## 2016-11-16 NOTE — Progress Notes (Signed)
Pharmacy Antibiotic Note  Jaime Lopez is a 66 y.o. male admitted on 11/16/2016 with sepsis.  Pharmacy has been consulted for vancomycin and zosyn dosing. Tmax is 100.8 and WBC is elevated at 21.7. SCr is WNL and lactic acid is elevated at 5.32.   Plan: Vanc 1g + Zosyn 3.375gm IV x 1 already given per EDP Vanc 750mg  IV Q12H Zosyn 3.375gm IV Q8H (4 hr inf) F/u renal fxn, C&S, clinical status and trough at SS  Height: 5\' 10"  (177.8 cm) Weight: 238 lb (108 kg) IBW/kg (Calculated) : 73  Temp (24hrs), Avg:99.9 F (37.7 C), Min:99 F (37.2 C), Max:100.8 F (38.2 C)   Recent Labs Lab 11/16/16 1244 11/16/16 1435  WBC 21.7*  --   CREATININE 1.16  --   LATICACIDVEN  --  5.32*    Estimated Creatinine Clearance: 79.2 mL/min (by C-G formula based on SCr of 1.16 mg/dL).    No Known Allergies  Antimicrobials this admission: Vanc 2/10>> Zosyn 2/10>>  Dose adjustments this admission: N/A  Microbiology results: Pending  Thank you for allowing pharmacy to be a part of this patient's care.  Jaime Lopez, Jaime Lopez 11/16/2016 3:52 PM

## 2016-11-17 ENCOUNTER — Inpatient Hospital Stay (HOSPITAL_COMMUNITY): Payer: Medicare Other

## 2016-11-17 DIAGNOSIS — R55 Syncope and collapse: Secondary | ICD-10-CM

## 2016-11-17 DIAGNOSIS — E876 Hypokalemia: Secondary | ICD-10-CM

## 2016-11-17 DIAGNOSIS — A09 Infectious gastroenteritis and colitis, unspecified: Secondary | ICD-10-CM

## 2016-11-17 DIAGNOSIS — I10 Essential (primary) hypertension: Secondary | ICD-10-CM

## 2016-11-17 DIAGNOSIS — F209 Schizophrenia, unspecified: Secondary | ICD-10-CM

## 2016-11-17 DIAGNOSIS — A419 Sepsis, unspecified organism: Principal | ICD-10-CM

## 2016-11-17 LAB — GASTROINTESTINAL PANEL BY PCR, STOOL (REPLACES STOOL CULTURE)
ADENOVIRUS F40/41: NOT DETECTED
Astrovirus: NOT DETECTED
CAMPYLOBACTER SPECIES: NOT DETECTED
CRYPTOSPORIDIUM: NOT DETECTED
CYCLOSPORA CAYETANENSIS: NOT DETECTED
Entamoeba histolytica: NOT DETECTED
Enteroaggregative E coli (EAEC): NOT DETECTED
Enteropathogenic E coli (EPEC): NOT DETECTED
Enterotoxigenic E coli (ETEC): NOT DETECTED
Giardia lamblia: NOT DETECTED
Norovirus GI/GII: DETECTED — AB
PLESIMONAS SHIGELLOIDES: NOT DETECTED
ROTAVIRUS A: NOT DETECTED
SALMONELLA SPECIES: NOT DETECTED
SAPOVIRUS (I, II, IV, AND V): NOT DETECTED
SHIGELLA/ENTEROINVASIVE E COLI (EIEC): NOT DETECTED
Shiga like toxin producing E coli (STEC): NOT DETECTED
VIBRIO SPECIES: NOT DETECTED
Vibrio cholerae: NOT DETECTED
YERSINIA ENTEROCOLITICA: NOT DETECTED

## 2016-11-17 LAB — GLUCOSE, CAPILLARY
GLUCOSE-CAPILLARY: 135 mg/dL — AB (ref 65–99)
GLUCOSE-CAPILLARY: 163 mg/dL — AB (ref 65–99)
GLUCOSE-CAPILLARY: 150 mg/dL — AB (ref 65–99)
Glucose-Capillary: 123 mg/dL — ABNORMAL HIGH (ref 65–99)
Glucose-Capillary: 155 mg/dL — ABNORMAL HIGH (ref 65–99)

## 2016-11-17 LAB — CBC
HEMATOCRIT: 38 % — AB (ref 39.0–52.0)
HEMOGLOBIN: 12.2 g/dL — AB (ref 13.0–17.0)
MCH: 27.9 pg (ref 26.0–34.0)
MCHC: 32.1 g/dL (ref 30.0–36.0)
MCV: 87 fL (ref 78.0–100.0)
Platelets: 259 10*3/uL (ref 150–400)
RBC: 4.37 MIL/uL (ref 4.22–5.81)
RDW: 14.4 % (ref 11.5–15.5)
WBC: 10.4 10*3/uL (ref 4.0–10.5)

## 2016-11-17 LAB — COMPREHENSIVE METABOLIC PANEL
ALBUMIN: 2.9 g/dL — AB (ref 3.5–5.0)
ALK PHOS: 56 U/L (ref 38–126)
ALT: 38 U/L (ref 17–63)
AST: 29 U/L (ref 15–41)
Anion gap: 11 (ref 5–15)
BUN: 13 mg/dL (ref 6–20)
CALCIUM: 7.9 mg/dL — AB (ref 8.9–10.3)
CHLORIDE: 105 mmol/L (ref 101–111)
CO2: 20 mmol/L — AB (ref 22–32)
Creatinine, Ser: 0.9 mg/dL (ref 0.61–1.24)
GFR calc non Af Amer: >60 mL/min (ref >60)
GLUCOSE: 158 mg/dL — AB (ref 65–99)
Potassium: 3.4 mmol/L — ABNORMAL LOW (ref 3.5–5.1)
SODIUM: 136 mmol/L (ref 135–145)
Total Bilirubin: 0.7 mg/dL (ref 0.3–1.2)
Total Protein: 5.5 g/dL — ABNORMAL LOW (ref 6.5–8.1)

## 2016-11-17 LAB — URINE CULTURE

## 2016-11-17 LAB — TROPONIN I

## 2016-11-17 LAB — ECHOCARDIOGRAM COMPLETE
Height: 70 in
WEIGHTICAEL: 3714.31 [oz_av]

## 2016-11-17 LAB — HEMOGLOBIN A1C
Hgb A1c MFr Bld: 6.4 % — ABNORMAL HIGH (ref 4.8–5.6)
MEAN PLASMA GLUCOSE: 137 mg/dL

## 2016-11-17 LAB — MAGNESIUM: MAGNESIUM: 1.4 mg/dL — AB (ref 1.7–2.4)

## 2016-11-17 MED ORDER — POTASSIUM CHLORIDE CRYS ER 20 MEQ PO TBCR
40.0000 meq | EXTENDED_RELEASE_TABLET | Freq: Once | ORAL | Status: AC
  Administered 2016-11-17: 40 meq via ORAL
  Filled 2016-11-17: qty 2

## 2016-11-17 MED ORDER — ORAL CARE MOUTH RINSE: 15.0000 mL | Freq: Two times a day (BID) | OROMUCOSAL | Status: DC

## 2016-11-17 MED ORDER — ACETAMINOPHEN 325 MG PO TABS: 650.0000 mg | ORAL_TABLET | Freq: Four times a day (QID) | ORAL | Status: DC | PRN

## 2016-11-17 MED ORDER — MAGNESIUM SULFATE 4 GM/100ML IV SOLN
4.0000 g | Freq: Once | INTRAVENOUS | Status: AC
  Administered 2016-11-17: 4 g via INTRAVENOUS
  Filled 2016-11-17: qty 100

## 2016-11-17 NOTE — Progress Notes (Signed)
Patient's oxygen saturation dropped and was staying in the mid to upper 80's. Patient was placed on 2 liters nasal canula and is in the low 90's currently. Will continue to monitor.

## 2016-11-17 NOTE — Progress Notes (Signed)
Patient reports feeling weak today and not being able to adjust self in the bed. Patient reported that this isn't his usual and that he is independent at home. Notified MD about patient's complaint of weakness.

## 2016-11-17 NOTE — Progress Notes (Addendum)
PROGRESS NOTE  Jaime Lopez  GGE:[id number] DOB: 01-Jun-1951  DOA: 11/16/2016 PCP: Karlene Einstein, MD   Brief Narrative:  66 year old male, lives alone in an apartment, independent of activities of daily living, PMH of schizophrenia (Dr. Ty Hilts, Psychiatrist), HTN, HLD, DM 2, remote seizures 10 years ago and not on AEDs, brought to ED by EMS after he sustained a fall. Last visit with psychiatry 3 months PTA and no recent medication changes. Visits his mother at SNF daily and apparently an outbreak of diarrhea at her facility but not in his apartment building. He had 3 episodes of loose stools on day of admission. He was walking in the parking lot to [employer] when he felt dizzy, lightheaded (denies vertigo) and kept running forwards due to gait instability and then fell hitting right side of his face. He states that he was alone although H&P states the fall was witnessed by his brother. Patient specifically denies LOC. Denies prior such episodes of fall or syncope. In ED noted to have tachycardia 123/m, fever 100.8, tachypnea 27/m. He was treated for presumed sepsis of unclear source? GI source with sepsis protocol IV fluids and broad-spectrum IV antibiotics (vancomycin & Zosyn) and for dehydration. CT head negative for acute findings. Admitted to stepdown for further management.   Assessment & Plan:   Active Problems:   Sepsis ([place])   Chronic bilateral low back pain without sciatica   Hyperlipemia   Hypertension   Obesity, Class II, BMI 35-39.9, with comorbidity   Schizophrenia ([place])   Syncope   Diarrhea   Sepsis - Unclear source, presumed GI origin (patient visits his mother at SNF daily where there is an outbreak of diarrhea).? Acute viral GE. - Met sepsis criteria on admission. Treated for sepsis bundle with IV fluids and broad-spectrum IV antibiotics (vancomycin & Zosyn). - Chest x-ray without acute findings. Urine microscopy negative for UTI. C. difficile testing negative.  Influenza panel PCR negative. GI pathogen panel PCR: Pending. Urine culture 1 and blood cultures 2: Pending. - Sepsis physiology improved. MRSA PCR negative. If preliminary cultures negative, consider discontinuing all antibiotics and monitor clinically.  Diarrhea/? Acute viral GE Management as above. Improving.  Presyncope versus? Syncope - May be related to sepsis, dehydration and may have been orthostatic. Low index of suspicion for seizures. CT head negative. Dilantin level <2.5 but patient not on AEDs. - Telemetry without arrhythmia's. 2-D echo: Normal LVEF.  - TSH 1.453. - PT evaluation.  Type II DM - Holding oral medications. Continue SSI. Good inpatient control. Follow A1c.  Essential hypertension - Mildly uncontrolled. No orthostatic changes 2/11 AM. Continue amlodipine and lisinopril.  Hyperlipidemia - Continue statins.  Schizophrenia - Stable without audiovisual hallucinations, suicidal or homicidal ideations. - Continue home medications: Prolixin and Cogentin. Outpatient psychiatry follow-up.  Hypokalemia and hypomagnesemia - Replace and follow.  Elevated lactate - Secondary to dehydration and sepsis physiology. Improved. Follow lactate in a.m.   DVT prophylaxis: Lovenox Code Status: Full Family Communication: Discussed with patient's brother, updated care and answered questions. States that his wife told him that paramedics stated that he passed out. Also h/o unsteady gait for the last > 1 year, s/p fall 3 months back. Disposition Plan: DC home when medically stable, ? 11/18/2016.   Consultants:   None  Procedures:   2-D echo 11/17/16: Study Conclusions  - Left ventricle: The cavity size was normal. Wall thickness was   increased in a pattern of mild LVH. Systolic function was   vigorous. The estimated ejection fraction  was in the range of 65%   to 70%. Wall motion was normal; there were no regional wall   motion abnormalities. Doppler parameters are  consistent with   abnormal left ventricular relaxation (grade 1 diastolic   dysfunction). - Mitral valve: There was trivial regurgitation.  Antimicrobials:   IV Vancomycin & Zosyn    Subjective: Feels better. Diarrhea improved. Generalized weakness. Overnight events noted-sustained a fall in ED. No significant injuries.   Objective:  Vitals:   11/17/16 0712 11/17/16 0800 11/17/16 0808 11/17/16 0812  BP: (!) 159/91 (!) 159/91 (!) 156/76 (!) 158/89  Pulse: (!) 107 (!) 107 97 (!) 112  Resp: (!) 28 (!) 27 (!) 24 (!) 22  Temp: 99.6 F (37.6 C)     TempSrc: Oral     SpO2: 91% 94% 94% 92%  Weight:      Height:        Intake/Output Summary (Last 24 hours) at 11/17/16 1113 Last data filed at 11/17/16 0800  Gross per 24 hour  Intake             1950 ml  Output             1350 ml  Net              600 ml   Filed Weights   11/16/16 1149 11/16/16 2026 11/17/16 0403  Weight: 108 kg (238 lb) 107 kg (235 lb 14.3 oz) 105.3 kg (232 lb 2.3 oz)    Examination:  General exam: Pleasant middle-aged male lying comfortably supine in bed. HEENT: Pupils equally reacting to light and accommodation. Bruising/fibrillation over right temporal and maxillary region.  Respiratory system: Clear to auscultation. Respiratory effort normal. Cardiovascular system: S1 & S2 heard, RRR. No JVD, murmurs, rubs, gallops or clicks. No pedal edema. Telemetry: SR-ST in the 100s.  Gastrointestinal system: Abdomen is nondistended, soft and nontender. No organomegaly or masses felt. Normal bowel sounds heard. Central nervous system: Alert and oriented. No focal neurological deficits. Extremities: Symmetric 5 x 5 power. Skin: No rashes, lesions or ulcers Psychiatry: Judgement and insight appear normal. Mood & affect flat.     Data Reviewed: I have personally reviewed following labs and imaging studies  CBC:  Recent Labs Lab 11/16/16 1244 11/17/16 0316  WBC 21.7* 10.4  HGB 14.4 12.2*  HCT 43.0 38.0*    MCV 87.4 87.0  PLT 299 388   Basic Metabolic Panel:  Recent Labs Lab 11/16/16 1244 11/17/16 0316 11/17/16 0800  NA 135 136  --   K 4.8 3.4*  --   CL 105 105  --   CO2 16* 20*  --   GLUCOSE 220* 158*  --   BUN 17 13  --   CREATININE 1.16 0.90  --   CALCIUM 8.5* 7.9*  --   MG  --   --  1.4*   GFR: Estimated Creatinine Clearance: 100.7 mL/min (by C-G formula based on SCr of 0.9 mg/dL). Liver Function Tests:  Recent Labs Lab 11/16/16 1244 11/17/16 0316  AST 39 29  ALT 37 38  ALKPHOS 67 56  BILITOT 0.6 0.7  PROT 6.6 5.5*  ALBUMIN 3.6 2.9*   No results for input(s): LIPASE, AMYLASE in the last 168 hours. No results for input(s): AMMONIA in the last 168 hours. Coagulation Profile:  Recent Labs Lab 11/16/16 1817  INR 1.15   Cardiac Enzymes:  Recent Labs Lab 11/16/16 1817 11/16/16 2118 11/17/16 0316  TROPONINI <0.03 <0.03 <0.03   BNP (  last 3 results) No results for input(s): PROBNP in the last 8760 hours. HbA1C: No results for input(s): HGBA1C in the last 72 hours. CBG:  Recent Labs Lab 11/16/16 1740 11/17/16 0012 11/17/16 0800  GLUCAP 150* 123* 135*   Lipid Profile: No results for input(s): CHOL, HDL, LDLCALC, TRIG, CHOLHDL, LDLDIRECT in the last 72 hours. Thyroid Function Tests:  Recent Labs  11/16/16 1817  TSH 1.453   Anemia Panel: No results for input(s): VITAMINB12, FOLATE, FERRITIN, TIBC, IRON, RETICCTPCT in the last 72 hours.  Sepsis Labs:  Recent Labs Lab 11/16/16 1244 11/16/16 1435 11/16/16 1817 11/16/16 1825 11/16/16 2006 11/17/16 0316  PROCALCITON  --   --  0.54  --   --   --   WBC 21.7*  --   --   --   --  10.4  LATICACIDVEN  --  5.32* 2.8* 2.79* 2.9*  --     Recent Results (from the past 240 hour(s))  C difficile quick scan w PCR reflex     Status: None   Collection Time: 11/16/16  7:41 PM  Result Value Ref Range Status   C Diff antigen NEGATIVE NEGATIVE Final   C Diff toxin NEGATIVE NEGATIVE Final   C Diff  interpretation No C. difficile detected.  Final  MRSA PCR Screening     Status: None   Collection Time: 11/16/16  8:35 PM  Result Value Ref Range Status   MRSA by PCR NEGATIVE NEGATIVE Final    Comment:        The GeneXpert MRSA Assay (FDA approved for NASAL specimens only), is one component of a comprehensive MRSA colonization surveillance program. It is not intended to diagnose MRSA infection nor to guide or monitor treatment for MRSA infections.          Radiology Studies: Dg Chest 2 View  Result Date: 11/16/2016 CLINICAL DATA:  Pt reports that he suffers from vertigo and had a dizzy spell while walking into [place] earlier today causing him to fall on his face onto the pavement. No chest complaints right now. Pt has facial abrasions and abrasions EXAM: CHEST  2 VIEW COMPARISON:  None. FINDINGS: Normal mediastinum and cardiac silhouette. Normal pulmonary vasculature. No evidence of effusion, infiltrate, or pneumothorax. No acute bony abnormality. Continuous osteophytosis of the thoracic spine. IMPRESSION: No acute cardiopulmonary process. Electronically Signed   By: Suzy Bouchard M.D.   On: 11/16/2016 13:32   Ct Head Wo Contrast  Result Date: 11/16/2016 CLINICAL DATA:  Status post fall, confusion, loss of consciousness. EXAM: CT HEAD WITHOUT CONTRAST CT MAXILLOFACIAL WITHOUT CONTRAST TECHNIQUE: Multidetector CT imaging of the head and maxillofacial structures were performed using the standard protocol without intravenous contrast. Multiplanar CT image reconstructions of the maxillofacial structures were also generated. COMPARISON:  None. FINDINGS: CT HEAD FINDINGS Brain: No evidence of acute infarction, hemorrhage, extra-axial collection, ventriculomegaly, or mass effect. Generalized cerebral atrophy. Periventricular white matter low attenuation likely secondary to microangiopathy. Vascular: Cerebrovascular atherosclerotic calcifications are noted. Skull: Negative for  fracture or focal lesion. Sinuses/Orbits: Visualized portions of the orbits are unremarkable. Mastoid sinuses are clear. Bilateral maxillary sinus mucosal thickening. Other: None. CT MAXILLOFACIAL FINDINGS Osseous: No fracture or mandibular dislocation. No destructive process. Orbits: Negative. No traumatic or inflammatory finding. Sinuses: Bilateral maxillary sinus mucosal thickening. No paranasal sinus air-fluid level. Mastoid sinuses are clear. Soft tissues: Negative. Other:  Periapical lucency of the second left maxillary molar. IMPRESSION: 1. No acute intracranial pathology. 2. No acute osseous injury of the  maxillofacial bones. Electronically Signed   By: Kathreen Devoid   On: 11/16/2016 13:20   Ct Maxillofacial Wo Contrast  Result Date: 11/16/2016 CLINICAL DATA:  Status post fall, confusion, loss of consciousness. EXAM: CT HEAD WITHOUT CONTRAST CT MAXILLOFACIAL WITHOUT CONTRAST TECHNIQUE: Multidetector CT imaging of the head and maxillofacial structures were performed using the standard protocol without intravenous contrast. Multiplanar CT image reconstructions of the maxillofacial structures were also generated. COMPARISON:  None. FINDINGS: CT HEAD FINDINGS Brain: No evidence of acute infarction, hemorrhage, extra-axial collection, ventriculomegaly, or mass effect. Generalized cerebral atrophy. Periventricular white matter low attenuation likely secondary to microangiopathy. Vascular: Cerebrovascular atherosclerotic calcifications are noted. Skull: Negative for fracture or focal lesion. Sinuses/Orbits: Visualized portions of the orbits are unremarkable. Mastoid sinuses are clear. Bilateral maxillary sinus mucosal thickening. Other: None. CT MAXILLOFACIAL FINDINGS Osseous: No fracture or mandibular dislocation. No destructive process. Orbits: Negative. No traumatic or inflammatory finding. Sinuses: Bilateral maxillary sinus mucosal thickening. No paranasal sinus air-fluid level. Mastoid sinuses are clear.  Soft tissues: Negative. Other:  Periapical lucency of the second left maxillary molar. IMPRESSION: 1. No acute intracranial pathology. 2. No acute osseous injury of the maxillofacial bones. Electronically Signed   By: Kathreen Devoid   On: 11/16/2016 13:20        Scheduled Meds: . amLODipine  10 mg Oral Daily  . atorvastatin  20 mg Oral Daily  . benztropine  0.5 mg Oral Daily  . enoxaparin (LOVENOX) injection  40 mg Subcutaneous Q24H  . fluPHENAZine  5 mg Oral QHS  . gabapentin  300 mg Oral TID  . insulin aspart  0-9 Units Subcutaneous TID WC  . lactobacillus acidophilus  2 tablet Oral TID  . lisinopril  40 mg Oral Daily  . piperacillin-tazobactam (ZOSYN)  IV  3.375 g Intravenous Q8H  . traZODone  100 mg Oral QHS  . vancomycin  750 mg Intravenous Q12H   Continuous Infusions:   LOS: 1 day      [facility], MD [facility] Pager [phone]  If 7PM-7AM, please contact night-coverage [email or web address] Password [email or web address] 11/17/2016, 11:13 AM

## 2016-11-18 ENCOUNTER — Inpatient Hospital Stay (HOSPITAL_COMMUNITY): Payer: Medicare Other

## 2016-11-18 DIAGNOSIS — E785 Hyperlipidemia, unspecified: Secondary | ICD-10-CM

## 2016-11-18 DIAGNOSIS — A0811 Acute gastroenteropathy due to Norwalk agent: Secondary | ICD-10-CM

## 2016-11-18 LAB — GLUCOSE, CAPILLARY
GLUCOSE-CAPILLARY: 201 mg/dL — AB (ref 65–99)
Glucose-Capillary: 252 mg/dL — ABNORMAL HIGH (ref 65–99)
Glucose-Capillary: 145 mg/dL — ABNORMAL HIGH (ref 65–99)

## 2016-11-18 LAB — BASIC METABOLIC PANEL
Anion gap: 10 (ref 5–15)
BUN: 9 mg/dL (ref 6–20)
CHLORIDE: 102 mmol/L (ref 101–111)
CO2: 23 mmol/L (ref 22–32)
Calcium: 7.9 mg/dL — ABNORMAL LOW (ref 8.9–10.3)
Creatinine, Ser: 0.95 mg/dL (ref 0.61–1.24)
GFR calc Af Amer: >60 mL/min (ref >60)
GFR calc non Af Amer: >60 mL/min (ref >60)
GLUCOSE: 147 mg/dL — AB (ref 65–99)
POTASSIUM: 3.6 mmol/L (ref 3.5–5.1)
Sodium: 135 mmol/L (ref 135–145)

## 2016-11-18 LAB — MAGNESIUM: Magnesium: 2.3 mg/dL (ref 1.7–2.4)

## 2016-11-18 NOTE — Evaluation (Signed)
Physical Therapy Evaluation Patient Details Name: Jaime EdisDouglas Dobis MRN: [id number] DOB: Nov 14, 1950 Today's Date: 11/18/2016   History of Present Illness  66 year old male, independent of activities of daily living, PMH of schizophrenia (Dr. Vivianne SpenceAleygori, Psychiatrist), HTN, HLD, DM 2, remote seizures 10 years ago and not on AEDs, brought to ED by EMS after he sustained a fall. Dx with sepsis with GI possible source  Clinical Impression  Patient seen for mobility assessment and O2 saturations assessment. Patient mobilizing well but does report some generalized weakness. Ambulated on room air with saturations dropping from 92% at rest to 89% but improved with rest and pursed lip breathing to 93%. Educated patient on energy conservation. Patient decliningg further PT services at this time. Will sign off.    Follow Up Recommendations Supervision/Assistance - 24 hour (recommending HHPT but patient declining)    Equipment Recommendations  None recommended by PT    Recommendations for Other Services       Precautions / Restrictions Precautions Precaution Comments: watch O2 saturations and HR Restrictions Weight Bearing Restrictions: No      Mobility  Bed Mobility Overal bed mobility: Modified Independent             General bed mobility comments: increased time to perform, no physical assist required  Transfers Overall transfer level: Modified independent Equipment used: None             General transfer comment: increased time andpush off of bed rail to elevate to standing, no physical assist   Ambulation/Gait Ambulation/Gait assistance: Supervision Ambulation Distance (Feet): 210 Feet Assistive device: None Gait Pattern/deviations: Step-through pattern;Decreased stride length;Wide base of support Gait velocity: decreased   General Gait Details: patient with steady gait, ambulated on room air with saturations dropping to 89%, improved to 93% with rest and cues for pursed  lip breathing.  Stairs            Wheelchair Mobility    Modified Rankin (Stroke Patients Only)       Balance Overall balance assessment: History of Falls                                           Pertinent Vitals/Pain Pain Assessment: No/denies pain    Home Living Family/patient expects to be discharged to:: Private residence Living Arrangements: Other relatives Available Help at Discharge: Family;Available PRN/intermittently Type of Home: Apartment Home Access: Stairs to enter Entrance Stairs-Rails: Can reach both Entrance Stairs-Number of Steps: 12 Home Layout: One level        Prior Function Level of Independence: Independent               Hand Dominance   Dominant Hand: Right    Extremity/Trunk Assessment   Upper Extremity Assessment Upper Extremity Assessment: Overall WFL for tasks assessed    Lower Extremity Assessment Lower Extremity Assessment: Generalized weakness       Communication   Communication: No difficulties  Cognition Arousal/Alertness: Awake/alert Behavior During Therapy: Flat affect Overall Cognitive Status: No family/caregiver present to determine baseline cognitive functioning                      General Comments General comments (skin integrity, edema, etc.): educated patient on energy conservation techniques and safety with mobility    Exercises     Assessment/Plan    PT Assessment Patent does not need any further PT services  PT Problem List            PT Treatment Interventions      PT Goals (Current goals can be found in the Care Plan section)  Acute Rehab PT Goals PT Goal Formulation: All assessment and education complete, DC therapy    Frequency     Barriers to discharge        Co-evaluation               End of Session   Activity Tolerance: Patient tolerated treatment well Patient left: in bed;with call bell/phone within reach;with bed alarm set Nurse  Communication: Mobility status         Time: 1914-7829 PT Time Calculation (min) (ACUTE ONLY): 17 min   Charges:   PT Evaluation $PT Eval Low Complexity: 1 Procedure     PT G Codes:        Fabio Asa 11-27-2016, 9:44 AM Charlotte Crumb, PT DPT  [phone]

## 2016-11-18 NOTE — Plan of Care (Signed)
Problem: Safety: Goal: Ability to remain free from injury will improve Outcome: Completed/Met Date Met: 11/18/16 Patient at high risk for falls, due to being admitted for a fall. Instructed to call for assistance when needed.   Problem: Health Behavior/Discharge Planning: Goal: Ability to manage health-related needs will improve Outcome: Completed/Met Date Met: 11/18/16 PT evaluated patient; declined home health at this time.   Problem: Activity: Goal: Risk for activity intolerance will decrease Outcome: Progressing Ambulated in hallway with PT.    

## 2016-11-18 NOTE — Care Management Note (Signed)
Case Management Note  Patient Details  Name: Jaime Lopez MRN: [id number] Date of Birth: 22-Jul-1951  Subjective/Objective:   Presents with sepsis, per pt eval rec hhpt, but patient declines, Patient is for dc today.                  Action/Plan:   Expected Discharge Date:  11/18/16               Expected Discharge Plan:  Home/Self Care  In-House Referral:     Discharge planning Services  CM Consult  Post Acute Care Choice:    Choice offered to:     DME Arranged:    DME Agency:     HH Arranged:  Patient Refused HH Agency:     Status of Service:  Completed, signed off  If discussed at [employer] Meetings, dates discussed:    Additional Comments:  Leone Havenaylor, Abdulmalik Darco Clinton, RN 11/18/2016, 5:54 PM

## 2016-11-18 NOTE — Progress Notes (Signed)
Patient being discharged to home. Patient lives alone, but brother will be assisting in caretaking for the time being. PIV x1 removed per protocol. CCMD notified of patient's discharge. All belongings returned. Discharge instructions, including medications and follow up appts, reviewed with patient and patient's brother Dorinda HillDonald. Handouts given on norovirus and syncope.   Leanna BattlesEckelmann, Deja Pisarski Eileen, RN.

## 2016-11-18 NOTE — Discharge Instructions (Addendum)
Norovirus Infection A norovirus infection is caused by exposure to a virus in a group of similar viruses (noroviruses). This type of infection causes inflammation in your stomach and intestines (gastroenteritis). Norovirus is the most common cause of gastroenteritis. It also causes food poisoning. Anyone can get a norovirus infection. It spreads very easily (contagious). You can get it from contaminated food, water, surfaces, or other people. Norovirus is found in the stool or vomit of infected people. You can spread the infection as soon as you feel sick until 2 weeks after you recover.  Symptoms usually begin within 2 days after you become infected. Most norovirus symptoms affect the digestive system. CAUSES Norovirus infection is caused by contact with norovirus. You can catch norovirus if you:  Eat or drink something contaminated with norovirus.  Touch surfaces or objects contaminated with norovirus and then put your hand in your mouth.  Have direct contact with an infected person who has symptoms.  Share food, drink, or utensils with someone with who is sick with norovirus. SIGNS AND SYMPTOMS Symptoms of norovirus may include:  Nausea.  Vomiting.  Diarrhea.  Stomach cramps.  Fever.  Chills.  Headache.  Muscle aches.  Tiredness. DIAGNOSIS Your health care provider may suspect norovirus based on your symptoms and physical exam. Your health care provider may also test a sample of your stool or vomit for the virus.  TREATMENT There is no specific treatment for norovirus. Most people get better without treatment in about 2 days. HOME CARE INSTRUCTIONS  Replace lost fluids by drinking plenty of water or rehydration fluids containing important minerals called electrolytes. This prevents dehydration. Drink enough fluid to keep your urine clear or pale yellow.  Do not prepare food for others while you are infected. Wait at least 3 days after recovering from the illness to do  that. PREVENTION   Wash your hands often, especially after using the toilet or changing a diaper.  Wash fruits and vegetables thoroughly before preparing or serving them.  Throw out any food that a sick person may have touched.  Disinfect contaminated surfaces immediately after someone in the household has been sick. Use a bleach-based household cleaner.  Immediately remove and wash soiled clothes or sheets. SEEK MEDICAL CARE IF:  Your vomiting, diarrhea, and stomach pain is getting worse.  Your symptoms of norovirus do not go away after 2-3 days. SEEK IMMEDIATE MEDICAL CARE IF:  You develop symptoms of dehydration that do not improve with fluid replacement. This may include:  Excessive sleepiness.  Lack of tears.  Dry mouth.  Dizziness when standing.  Weak pulse. This information is not intended to replace advice given to you by your health care provider. Make sure you discuss any questions you have with your health care provider. Document Released: 12/14/2002 Document Revised: 10/14/2014 Document Reviewed: 03/03/2014 [employer] Interactive Patient Education  2017 [employer]   Syncope Syncope is when you temporarily lose consciousness. Syncope may also be called fainting or passing out. It is caused by a sudden decrease in blood flow to the brain. Even though most causes of syncope are not dangerous, syncope can be a sign of a serious medical problem. Signs that you may be about to faint include:  Feeling dizzy or light-headed.  Feeling nauseous.  Seeing all white or all black in your field of vision.  Having cold, clammy skin. If you fainted, get medical help right away.Call your local emergency services (911 in the [place] Do not drive yourself to the hospital. Follow  these instructions at home: Pay attention to any changes in your symptoms. Take these actions to help with your condition:  Have someone stay with you until you feel stable.  Do not drive, use  machinery, or play sports until your health care provider says it is okay.  Keep all follow-up visits as told by your health care provider. This is important.  If you start to feel like you might faint, lie down right away and raise (elevate) your feet above the level of your heart. Breathe deeply and steadily. Wait until all of the symptoms have passed.  Drink enough fluid to keep your urine clear or pale yellow.  If you are taking blood pressure or heart medicine, get up slowly and take several minutes to sit and then stand. This can reduce dizziness.  Take over-the-counter and prescription medicines only as told by your health care provider. Get help right away if:  You have a severe headache.  You have unusual pain in your chest, abdomen, or back.  You are bleeding from your mouth or rectum, or you have black or tarry stool.  You have a very fast or irregular heartbeat (palpitations).  You have pain with breathing.  You faint once or repeatedly.  You have a seizure.  You are confused.  You have trouble walking.  You have severe weakness.  You have vision problems. These symptoms may represent a serious problem that is an emergency. Do not wait to see if your symptoms will go away. Get medical help right away. Call your local emergency services (911 in the [place] Do not drive yourself to the hospital.  This information is not intended to replace advice given to you by your health care provider. Make sure you discuss any questions you have with your health care provider. Document Released: 09/23/2005 Document Revised: 02/29/2016 Document Reviewed: 06/07/2015 Elsevier Interactive Patient Education  2017 [employer]   Discharge instructions:  Please get your medications reviewed and adjusted by your Primary MD.  Please request your Primary MD to go over all Hospital Tests and Procedure/Radiological results at the follow up, please get all Hospital records sent to your  Prim MD by signing hospital release before you go home.  If you had Pneumonia of Lung problems at the Hospital: Please get a 2 view Chest X ray done in 6-8 weeks after hospital discharge or sooner if instructed by your Primary MD.  If you have Congestive Heart Failure: Please call your Cardiologist or Primary MD anytime you have any of the following symptoms:  1) 3 pound weight gain in 24 hours or 5 pounds in 1 week  2) shortness of breath, with or without a dry hacking cough  3) swelling in the hands, feet or stomach  4) if you have to sleep on extra pillows at night in order to breathe  Follow cardiac low salt diet and 1.5 lit/day fluid restriction.  If you have diabetes Accuchecks 4 times/day, Once in AM empty stomach and then before each meal. Log in all results and show them to your primary doctor at your next visit. If any glucose reading is under 80 or above 300 call your primary MD immediately.  If you have Seizure/Convulsions/Epilepsy: Please do not drive, operate heavy machinery, participate in activities at heights or participate in high speed sports until you have seen by Primary MD or a Neurologist and advised to do so again.  If you had Gastrointestinal Bleeding: Please ask your Primary MD to check  a complete blood count within one week of discharge or at your next visit. Your endoscopic/colonoscopic biopsies that are pending at the time of discharge, will also need to followed by your Primary MD.  Get Medicines reviewed and adjusted. Please take all your medications with you for your next visit with your Primary MD  Please request your Primary MD to go over all hospital tests and procedure/radiological results at the follow up, please ask your Primary MD to get all Hospital records sent to his/her office.  If you experience worsening of your admission symptoms, develop shortness of breath, life threatening emergency, suicidal or homicidal thoughts you must seek medical  attention immediately by calling 911 or calling your MD immediately  if symptoms less severe.  You must read complete instructions/literature along with all the possible adverse reactions/side effects for all the Medicines you take and that have been prescribed to you. Take any new Medicines after you have completely understood and accpet all the possible adverse reactions/side effects.   Do not drive or operate heavy machinery when taking Pain medications.   Do not take more than prescribed Pain, Sleep and Anxiety Medications  Special Instructions: If you have smoked or chewed Tobacco  in the last 2 yrs please stop smoking, stop any regular Alcohol  and or any Recreational drug use.  Wear Seat belts while driving.  Please note You were cared for by a hospitalist during your hospital stay. If you have any questions about your discharge medications or the care you received while you were in the hospital after you are discharged, you can call the unit and asked to speak with the hospitalist on call if the hospitalist that took care of you is not available. Once you are discharged, your primary care physician will handle any further medical issues. Please note that NO REFILLS for any discharge medications will be authorized once you are discharged, as it is imperative that you return to your primary care physician (or establish a relationship with a primary care physician if you do not have one) for your aftercare needs so that they can reassess your need for medications and monitor your lab values.  You can reach the hospitalist office at phone [phone] or fax [phone]   If you do not have a primary care physician, you can call [phone] for a physician referral.

## 2016-11-18 NOTE — Discharge Summary (Signed)
Physician Discharge Summary  Jaime Lopez PNT:[id number] DOB: 14-Feb-1951  PCP: Karlene Einstein, MD  Admit date: 11/16/2016 Discharge date: 11/18/2016  Recommendations for Outpatient Follow-up:  1. Dr. Charlcie Cradle, PCP in 4 days with repeat labs (CBC & BMP). Please follow final blood culture results that were sent from the hospital.  Home Health: Home health PT recommended but patient declines. Equipment/Devices: None    Discharge Condition: Improved and stable  CODE STATUS: Full  Diet recommendation: Heart healthy and diabetic diet.  Discharge Diagnoses:  Active Problems:   Sepsis ([place])   Chronic bilateral low back pain without sciatica   Hyperlipemia   Hypertension   Obesity, Class II, BMI 35-39.9, with comorbidity   Schizophrenia ([place])   Syncope   Diarrhea   Brief/Interim Summary: 66 year old male, lives alone in an apartment, independent of activities of daily living, PMH of schizophrenia (Dr. Ty Hilts, Psychiatrist), HTN, HLD, DM 2, remote seizures 10 years ago and not on AEDs, brought to ED by EMS after he sustained a fall. Last visit with psychiatry 3 months PTA and no recent medication changes. Visits his mother at SNF daily and apparently an outbreak of diarrhea at her facility but not in his apartment building. He had 3 episodes of loose stools on day of admission. He was walking in the parking lot to [employer] when he felt dizzy, lightheaded (denies vertigo) and kept running forwards due to gait instability and then fell hitting right side of his face. He states that he was alone although H&P states the fall was witnessed by his brother. Patient specifically denies LOC. Denies prior such episodes of fall or syncope. In ED noted to have tachycardia 123/m, fever 100.8, tachypnea 27/m. He was treated for presumed sepsis of unclear source? GI source with sepsis protocol IV fluids and broad-spectrum IV antibiotics (vancomycin & Zosyn) and for dehydration. CT head negative for  acute findings. Admitted to stepdown for further management.   Assessment & Plan:   Sepsis secondary to Norovirus acute GE - patient visits his mother at SNF daily where there is an outbreak of diarrhea. - Met sepsis criteria on admission. Treated for sepsis bundle with IV fluids and broad-spectrum IV antibiotics (vancomycin & Zosyn). - Chest x-ray without acute findings. Urine microscopy negative for UTI. C. difficile testing negative. Influenza panel PCR negative. GI pathogen panel PCR: Noro virus. Urine culture 1: Suspicious for contamination and blood cultures 2: Negative to date. - Sepsis physiology improved. Based on positive GI pathogen panel results and negative other primary culture results, discontinued all antibiotics on 11/17/16. - There was some concern regarding hypoxia today. Patient stated that he has mild right-sided chest pain on deep breathing due to fall and trauma. Chest x-ray shows chronic bilateral rib fractures but no other acute findings. He was advised to use Tylenol as needed for pain and counseled regarding taking deep breaths. As per PT evaluation, oxygen saturations ranged between 89-93 percent on room air with activity.  Noro Virus Acute Diarrhea Seems to have resolved.  Presyncope versus Vs Syncope - May be related to sepsis, dehydration and may have been orthostatic. Low index of suspicion for seizures. CT head negative. Dilantin level <2.5 but patient not on AEDs. - Telemetry without arrhythmia's. 2-D echo: Normal LVEF.  - TSH 1.453. - PT evaluation, recommended home health PT which patient declines. - As per discussion with patient's brother, his wife heard paramedics stating that he passed out. Patient apparently has been having unsteady gait for at least >1 year  and a fall approximately 3 months back. Advised outpatient follow-up with PCP and consider outpatient neurology consultation for further evaluation. - Patient has been counseled that he should  not drive until cleared by a physician during outpatient follow-up and he verbalized understanding.  Type II DM - A1c: 6.4. Oral medications were held in the hospital and he was treated with SSI. Resume oral medications at discharge. Good outpatient control.  Essential hypertension - controlled. No orthostatic changes 2/11 AM. Continue amlodipine and lisinopril.  Hyperlipidemia - Continue statins.  Schizophrenia - Stable without audiovisual hallucinations, suicidal or homicidal ideations. - Continue home medications: Prolixin and Cogentin. Outpatient psychiatry follow-up.  Hypokalemia and hypomagnesemia - Replaced.  Elevated lactate - Secondary to dehydration and sepsis physiology. Improved.    Consultants:   None  Procedures:   2-D echo 11/17/16: Study Conclusions  - Left ventricle: The cavity size was normal. Wall thickness was increased in a pattern of mild LVH. Systolic function was vigorous. The estimated ejection fraction was in the range of 65% to 70%. Wall motion was normal; there were no regional wall motion abnormalities. Doppler parameters are consistent with abnormal left ventricular relaxation (grade 1 diastolic dysfunction). - Mitral valve: There was trivial regurgitation.   Discharge Instructions  Discharge Instructions    Call MD for:    Complete by:  As directed    Recurrent diarrhea.   Call MD for:  extreme fatigue    Complete by:  As directed    Call MD for:  persistant dizziness or light-headedness    Complete by:  As directed    Call MD for:  persistant nausea and vomiting    Complete by:  As directed    Call MD for:  severe uncontrolled pain    Complete by:  As directed    Call MD for:  temperature >100.4    Complete by:  As directed    Diet - low sodium heart healthy    Complete by:  As directed    Diet Carb Modified    Complete by:  As directed    Discharge instructions    Complete by:  As directed    Do not  drive until cleared by your outpatient physician during follow-up.   Increase activity slowly    Complete by:  As directed        Medication List    TAKE these medications   amLODipine 10 MG tablet Commonly known as:  NORVASC Take 10 mg by mouth daily.   atorvastatin 20 MG tablet Commonly known as:  LIPITOR Take 20 mg by mouth daily.   benztropine 1 MG tablet Commonly known as:  COGENTIN Take 0.5 mg by mouth daily.   fluPHENAZine 5 MG tablet Commonly known as:  PROLIXIN Take 5 mg by mouth at bedtime.   gabapentin 300 MG capsule Commonly known as:  NEURONTIN Take 300 mg by mouth 3 (three) times daily.   lisinopril 40 MG tablet Commonly known as:  PRINIVIL,ZESTRIL Take 40 mg by mouth daily.   sitaGLIPtin-metformin 50-1000 MG tablet Commonly known as:  JANUMET Take 1 tablet by mouth 2 (two) times daily with a meal.   traZODone 100 MG tablet Commonly known as:  DESYREL Take 100 mg by mouth at bedtime.      Follow-up Information    HAIMES,DAVID M, MD. Schedule an appointment as soon as possible for a visit in 4 day(s).   Specialty:  Family Medicine Why:  To be seen with repeat labs (CBC +  BMP). Contact information: [address] [place] [address] [phone]          No Known Allergies   Procedures/Studies: Dg Chest 2 View  Result Date: 11/18/2016 CLINICAL DATA:  Hypoxia. EXAM: CHEST  2 VIEW COMPARISON:  11/16/2016 FINDINGS: Hypoventilation with decreased lung volume. Elevated right hemidiaphragm and right lower lobe atelectasis similar to the prior study. Negative for pneumonia or heart failure. No pleural effusion. Chronic bilateral rib fractures. IMPRESSION: No active cardiopulmonary disease. Electronically Signed   By: Franchot Gallo M.D.   On: 11/18/2016 08:36   Dg Chest 2 View  Result Date: 11/16/2016 CLINICAL DATA:  Pt reports that he suffers from vertigo and had a dizzy spell while walking into [place] earlier today causing him to  fall on his face onto the pavement. No chest complaints right now. Pt has facial abrasions and abrasions EXAM: CHEST  2 VIEW COMPARISON:  None. FINDINGS: Normal mediastinum and cardiac silhouette. Normal pulmonary vasculature. No evidence of effusion, infiltrate, or pneumothorax. No acute bony abnormality. Continuous osteophytosis of the thoracic spine. IMPRESSION: No acute cardiopulmonary process. Electronically Signed   By: Suzy Bouchard M.D.   On: 11/16/2016 13:32   Ct Head Wo Contrast  Result Date: 11/16/2016 CLINICAL DATA:  Status post fall, confusion, loss of consciousness. EXAM: CT HEAD WITHOUT CONTRAST CT MAXILLOFACIAL WITHOUT CONTRAST TECHNIQUE: Multidetector CT imaging of the head and maxillofacial structures were performed using the standard protocol without intravenous contrast. Multiplanar CT image reconstructions of the maxillofacial structures were also generated. COMPARISON:  None. FINDINGS: CT HEAD FINDINGS Brain: No evidence of acute infarction, hemorrhage, extra-axial collection, ventriculomegaly, or mass effect. Generalized cerebral atrophy. Periventricular white matter low attenuation likely secondary to microangiopathy. Vascular: Cerebrovascular atherosclerotic calcifications are noted. Skull: Negative for fracture or focal lesion. Sinuses/Orbits: Visualized portions of the orbits are unremarkable. Mastoid sinuses are clear. Bilateral maxillary sinus mucosal thickening. Other: None. CT MAXILLOFACIAL FINDINGS Osseous: No fracture or mandibular dislocation. No destructive process. Orbits: Negative. No traumatic or inflammatory finding. Sinuses: Bilateral maxillary sinus mucosal thickening. No paranasal sinus air-fluid level. Mastoid sinuses are clear. Soft tissues: Negative. Other:  Periapical lucency of the second left maxillary molar. IMPRESSION: 1. No acute intracranial pathology. 2. No acute osseous injury of the maxillofacial bones. Electronically Signed   By: Kathreen Devoid   On:  11/16/2016 13:20   Ct Maxillofacial Wo Contrast  Result Date: 11/16/2016 CLINICAL DATA:  Status post fall, confusion, loss of consciousness. EXAM: CT HEAD WITHOUT CONTRAST CT MAXILLOFACIAL WITHOUT CONTRAST TECHNIQUE: Multidetector CT imaging of the head and maxillofacial structures were performed using the standard protocol without intravenous contrast. Multiplanar CT image reconstructions of the maxillofacial structures were also generated. COMPARISON:  None. FINDINGS: CT HEAD FINDINGS Brain: No evidence of acute infarction, hemorrhage, extra-axial collection, ventriculomegaly, or mass effect. Generalized cerebral atrophy. Periventricular white matter low attenuation likely secondary to microangiopathy. Vascular: Cerebrovascular atherosclerotic calcifications are noted. Skull: Negative for fracture or focal lesion. Sinuses/Orbits: Visualized portions of the orbits are unremarkable. Mastoid sinuses are clear. Bilateral maxillary sinus mucosal thickening. Other: None. CT MAXILLOFACIAL FINDINGS Osseous: No fracture or mandibular dislocation. No destructive process. Orbits: Negative. No traumatic or inflammatory finding. Sinuses: Bilateral maxillary sinus mucosal thickening. No paranasal sinus air-fluid level. Mastoid sinuses are clear. Soft tissues: Negative. Other:  Periapical lucency of the second left maxillary molar. IMPRESSION: 1. No acute intracranial pathology. 2. No acute osseous injury of the maxillofacial bones. Electronically Signed   By: Kathreen Devoid   On: 11/16/2016  13:20      Subjective: Eager to go home. No diarrhea reported. Complains of some right lower anterior chest wall pain on deep inspiration. No cough or dyspnea reported.  Discharge Exam:  Vitals:   11/18/16 0800 11/18/16 0900 11/18/16 1202 11/18/16 1212  BP: 133/85  124/77   Pulse: 93  (!) 103   Resp: (!) 24  (!) 25   Temp:    97.6 F (36.4 C)  TempSrc:    Axillary  SpO2:  100% 92%   Weight:      Height:         General exam: Pleasant middle-aged male lying comfortably supine in bed. HEENT: Pupils equally reacting to light and accommodation. Mild bruising over right temporal and maxillary region.  Respiratory system: Clear to auscultation. Respiratory effort normal. No external bruising over right side chest wall but minimally tender to palpation over lower right anterior chest wall. No crepitus. Cardiovascular system: S1 & S2 heard, RRR. No JVD, murmurs, rubs, gallops or clicks. No pedal edema. Telemetry: SR-ST in the 100s.  Gastrointestinal system: Abdomen is nondistended, soft and nontender. No organomegaly or masses felt. Normal bowel sounds heard. Central nervous system: Alert and oriented. No focal neurological deficits. Extremities: Symmetric 5 x 5 power. Skin: No rashes, lesions or ulcers Psychiatry: Judgement and insight appear normal. Mood & affect flat.     The results of significant diagnostics from this hospitalization (including imaging, microbiology, ancillary and laboratory) are listed below for reference.     Microbiology: Recent Results (from the past 240 hour(s))  Blood Culture (routine x 2)     Status: None (Preliminary result)   Collection Time: 11/16/16  2:25 PM  Result Value Ref Range Status   Specimen Description BLOOD RIGHT ANTECUBITAL  Final   Special Requests BOTTLES DRAWN AEROBIC AND ANAEROBIC 5CC  Final   Culture NO GROWTH 2 DAYS  Final   Report Status PENDING  Incomplete  Blood Culture (routine x 2)     Status: None (Preliminary result)   Collection Time: 11/16/16  2:27 PM  Result Value Ref Range Status   Specimen Description BLOOD RIGHT HAND  Final   Special Requests IN PEDIATRIC BOTTLE 3CC  Final   Culture NO GROWTH 2 DAYS  Final   Report Status PENDING  Incomplete  Urine culture     Status: Abnormal   Collection Time: 11/16/16  7:35 PM  Result Value Ref Range Status   Specimen Description URINE, CLEAN CATCH  Final   Special Requests NONE  Final    Culture MULTIPLE SPECIES PRESENT, SUGGEST RECOLLECTION (A)  Final   Report Status 11/17/2016 FINAL  Final  Gastrointestinal Panel by PCR , Stool     Status: Abnormal   Collection Time: 11/16/16  7:41 PM  Result Value Ref Range Status   Campylobacter species NOT DETECTED NOT DETECTED Final   Plesimonas shigelloides NOT DETECTED NOT DETECTED Final   Salmonella species NOT DETECTED NOT DETECTED Final   Yersinia enterocolitica NOT DETECTED NOT DETECTED Final   Vibrio species NOT DETECTED NOT DETECTED Final   Vibrio cholerae NOT DETECTED NOT DETECTED Final   Enteroaggregative E coli (EAEC) NOT DETECTED NOT DETECTED Final   Enteropathogenic E coli (EPEC) NOT DETECTED NOT DETECTED Final   Enterotoxigenic E coli (ETEC) NOT DETECTED NOT DETECTED Final   Shiga like toxin producing E coli (STEC) NOT DETECTED NOT DETECTED Final   Shigella/Enteroinvasive E coli (EIEC) NOT DETECTED NOT DETECTED Final   Cryptosporidium NOT DETECTED NOT DETECTED  Final   Cyclospora cayetanensis NOT DETECTED NOT DETECTED Final   Entamoeba histolytica NOT DETECTED NOT DETECTED Final   Giardia lamblia NOT DETECTED NOT DETECTED Final   Adenovirus F40/41 NOT DETECTED NOT DETECTED Final   Astrovirus NOT DETECTED NOT DETECTED Final   Norovirus GI/GII DETECTED (A) NOT DETECTED Final    Comment: RESULT CALLED TO, READ BACK BY AND VERIFIED WITH: Armen Pickup [facility] [place] 11/17/16 1445 SGD    Rotavirus A NOT DETECTED NOT DETECTED Final   Sapovirus (I, II, IV, and V) NOT DETECTED NOT DETECTED Final  C difficile quick scan w PCR reflex     Status: None   Collection Time: 11/16/16  7:41 PM  Result Value Ref Range Status   C Diff antigen NEGATIVE NEGATIVE Final   C Diff toxin NEGATIVE NEGATIVE Final   C Diff interpretation No C. difficile detected.  Final  MRSA PCR Screening     Status: None   Collection Time: 11/16/16  8:35 PM  Result Value Ref Range Status   MRSA by PCR NEGATIVE NEGATIVE Final    Comment:        The GeneXpert MRSA  Assay (FDA approved for NASAL specimens only), is one component of a comprehensive MRSA colonization surveillance program. It is not intended to diagnose MRSA infection nor to guide or monitor treatment for MRSA infections.      Labs:  Basic Metabolic Panel:  Recent Labs Lab 11/16/16 1244 11/17/16 0316 11/17/16 0800 11/18/16 0254  NA 135 136  --  135  K 4.8 3.4*  --  3.6  CL 105 105  --  102  CO2 16* 20*  --  23  GLUCOSE 220* 158*  --  147*  BUN 17 13  --  9  CREATININE 1.16 0.90  --  0.95  CALCIUM 8.5* 7.9*  --  7.9*  MG  --   --  1.4* 2.3   Liver Function Tests:  Recent Labs Lab 11/16/16 1244 11/17/16 0316  AST 39 29  ALT 37 38  ALKPHOS 67 56  BILITOT 0.6 0.7  PROT 6.6 5.5*  ALBUMIN 3.6 2.9*    CBC:  Recent Labs Lab 11/16/16 1244 11/17/16 0316  WBC 21.7* 10.4  HGB 14.4 12.2*  HCT 43.0 38.0*  MCV 87.4 87.0  PLT 299 259   Cardiac Enzymes:  Recent Labs Lab 11/16/16 1817 11/16/16 2118 11/17/16 0316  TROPONINI <0.03 <0.03 <0.03    CBG:  Recent Labs Lab 11/17/16 1300 11/17/16 1806 11/17/16 2126 11/18/16 0911 11/18/16 1127  GLUCAP 163* 155* 150* 252* 201*    Hgb A1c  Recent Labs  11/16/16 1817  HGBA1C 6.4*    Thyroid function studies  Recent Labs  11/16/16 1817  TSH 1.453   Urinalysis    Component Value Date/Time   COLORURINE YELLOW 11/16/2016 1935   APPEARANCEUR CLOUDY (A) 11/16/2016 1935   LABSPEC 1.023 11/16/2016 1935   PHURINE 5.0 11/16/2016 1935   GLUCOSEU 150 (A) 11/16/2016 1935   HGBUR NEGATIVE 11/16/2016 [place] NEGATIVE 11/16/2016 1935   KETONESUR 5 (A) 11/16/2016 1935   PROTEINUR NEGATIVE 11/16/2016 1935   NITRITE NEGATIVE 11/16/2016 1935   LEUKOCYTESUR NEGATIVE 11/16/2016 1935    Discussed in detail with patient's brother. Updated care and answered questions.   Time coordinating discharge: Over 30 minutes  SIGNED:  Vernell Leep, MD, FACP, [place]. [facility] Pager [phone]  [phone]  If 7PM-7AM, please contact night-coverage [email or web address] Password [facility] 11/18/2016, 3:39 PM

## 2016-11-18 NOTE — Progress Notes (Signed)
Inpatient Diabetes Program Recommendations  AACE/ADA: New Consensus Statement on Inpatient Glycemic Control (2015)  Target Ranges:  Prepandial:   less than 140 mg/dL      Peak postprandial:   less than 180 mg/dL (1-2 hours)      Critically ill patients:  140 - 180 mg/dL   Lab Results  Component Value Date   GLUCAP 201 (H) 11/18/2016   HGBA1C 6.4 (H) 11/16/2016    Review of Glycemic Control  Diabetes history:     DM2, Obesity Outpatient Diabetes medications:     Janumet 50-1000 BID Current orders for Inpatient glycemic control:     Sensitive correction scale Novolog 0[facility] units [facility]   Inpatient Diabetes Program Recommendations:    Please consider Meal coverage of Novolog 3 units TIDAC if patient eats > 50% of meal.  Thank you,  Kristine LineaKaren Presten Joost, RN, MSN Diabetes Coordinator Inpatient Diabetes Program [phone] (Team Pager)

## 2016-11-21 LAB — CULTURE, BLOOD (ROUTINE X 2)
Culture: NO GROWTH
Culture: NO GROWTH

## 2020-06-06 ENCOUNTER — Encounter (HOSPITAL_BASED_OUTPATIENT_CLINIC_OR_DEPARTMENT_OTHER): Payer: Self-pay

## 2020-06-06 ENCOUNTER — Other Ambulatory Visit: Payer: Self-pay

## 2020-06-06 ENCOUNTER — Emergency Department (HOSPITAL_BASED_OUTPATIENT_CLINIC_OR_DEPARTMENT_OTHER): Payer: Medicare Other

## 2020-06-06 ENCOUNTER — Inpatient Hospital Stay (HOSPITAL_BASED_OUTPATIENT_CLINIC_OR_DEPARTMENT_OTHER)
Admission: EM | Admit: 2020-06-06 | Discharge: 2020-06-14 | DRG: 178 | Disposition: A | Discharge status: Skilled Nursing Facility | Payer: Medicare Other | Source: Ambulatory Visit | Attending: Internal Medicine | Admitting: Internal Medicine

## 2020-06-06 DIAGNOSIS — I10 Essential (primary) hypertension: Secondary | ICD-10-CM | POA: Diagnosis present

## 2020-06-06 DIAGNOSIS — E1169 Type 2 diabetes mellitus with other specified complication: Secondary | ICD-10-CM | POA: Diagnosis present

## 2020-06-06 DIAGNOSIS — R0902 Hypoxemia: Secondary | ICD-10-CM | POA: Diagnosis present

## 2020-06-06 DIAGNOSIS — E1159 Type 2 diabetes mellitus with other circulatory complications: Secondary | ICD-10-CM | POA: Diagnosis present

## 2020-06-06 DIAGNOSIS — U071 COVID-19: Secondary | ICD-10-CM | POA: Diagnosis not present

## 2020-06-06 DIAGNOSIS — R531 Weakness: Secondary | ICD-10-CM

## 2020-06-06 DIAGNOSIS — Z6828 Body mass index (BMI) 28.0-28.9, adult: Secondary | ICD-10-CM

## 2020-06-06 DIAGNOSIS — W1830XA Fall on same level, unspecified, initial encounter: Secondary | ICD-10-CM | POA: Diagnosis present

## 2020-06-06 DIAGNOSIS — Z789 Other specified health status: Secondary | ICD-10-CM

## 2020-06-06 DIAGNOSIS — N179 Acute kidney failure, unspecified: Secondary | ICD-10-CM | POA: Diagnosis present

## 2020-06-06 DIAGNOSIS — E785 Hyperlipidemia, unspecified: Secondary | ICD-10-CM | POA: Diagnosis present

## 2020-06-06 DIAGNOSIS — Z79891 Long term (current) use of opiate analgesic: Secondary | ICD-10-CM

## 2020-06-06 DIAGNOSIS — E860 Dehydration: Secondary | ICD-10-CM | POA: Diagnosis present

## 2020-06-06 DIAGNOSIS — E669 Obesity, unspecified: Secondary | ICD-10-CM | POA: Diagnosis present

## 2020-06-06 DIAGNOSIS — G9340 Encephalopathy, unspecified: Secondary | ICD-10-CM | POA: Diagnosis present

## 2020-06-06 DIAGNOSIS — E119 Type 2 diabetes mellitus without complications: Secondary | ICD-10-CM

## 2020-06-06 DIAGNOSIS — Z79899 Other long term (current) drug therapy: Secondary | ICD-10-CM

## 2020-06-06 DIAGNOSIS — F209 Schizophrenia, unspecified: Secondary | ICD-10-CM | POA: Diagnosis present

## 2020-06-06 DIAGNOSIS — R296 Repeated falls: Secondary | ICD-10-CM | POA: Diagnosis present

## 2020-06-06 DIAGNOSIS — Z713 Dietary counseling and surveillance: Secondary | ICD-10-CM

## 2020-06-06 DIAGNOSIS — R4182 Altered mental status, unspecified: Secondary | ICD-10-CM

## 2020-06-06 DIAGNOSIS — Z7984 Long term (current) use of oral hypoglycemic drugs: Secondary | ICD-10-CM

## 2020-06-06 DIAGNOSIS — R6510 Systemic inflammatory response syndrome (SIRS) of non-infectious origin without acute organ dysfunction: Secondary | ICD-10-CM | POA: Diagnosis present

## 2020-06-06 DIAGNOSIS — R2681 Unsteadiness on feet: Secondary | ICD-10-CM | POA: Diagnosis present

## 2020-06-06 HISTORY — DX: Disorder of prostate, unspecified: N42.9

## 2020-06-06 HISTORY — DX: Type 2 diabetes mellitus without complications: E11.9

## 2020-06-06 LAB — CBC WITH DIFFERENTIAL/PLATELET
Abs Immature Granulocytes: 0.14 10*3/uL — ABNORMAL HIGH (ref 0.00–0.07)
Basophils Absolute: 0 10*3/uL (ref 0.0–0.1)
Basophils Relative: 0 %
Eosinophils Absolute: 0 10*3/uL (ref 0.0–0.5)
Eosinophils Relative: 0 %
HCT: 37.8 % — ABNORMAL LOW (ref 39.0–52.0)
Hemoglobin: 12.5 g/dL — ABNORMAL LOW (ref 13.0–17.0)
Immature Granulocytes: 1 %
Lymphocytes Relative: 8 %
Lymphs Abs: 1.3 10*3/uL (ref 0.7–4.0)
MCH: 29 pg (ref 26.0–34.0)
MCHC: 33.1 g/dL (ref 30.0–36.0)
MCV: 87.7 fL (ref 80.0–100.0)
Monocytes Absolute: 1.2 10*3/uL — ABNORMAL HIGH (ref 0.1–1.0)
Monocytes Relative: 7 %
Neutro Abs: 13.7 10*3/uL — ABNORMAL HIGH (ref 1.7–7.7)
Neutrophils Relative %: 84 %
Platelets: 369 10*3/uL (ref 150–400)
RBC: 4.31 MIL/uL (ref 4.22–5.81)
RDW: 13.7 % (ref 11.5–15.5)
WBC: 16.3 10*3/uL — ABNORMAL HIGH (ref 4.0–10.5)
nRBC: 0 % (ref 0.0–0.2)

## 2020-06-06 LAB — BASIC METABOLIC PANEL
Anion gap: 13 (ref 5–15)
BUN: 26 mg/dL — ABNORMAL HIGH (ref 8–23)
CO2: 22 mmol/L (ref 22–32)
Calcium: 9.6 mg/dL (ref 8.9–10.3)
Chloride: 105 mmol/L (ref 98–111)
Creatinine, Ser: 1.36 mg/dL — ABNORMAL HIGH (ref 0.61–1.24)
GFR calc Af Amer: >60 mL/min (ref >60)
GFR calc non Af Amer: 53 mL/min — ABNORMAL LOW (ref >60)
Glucose, Bld: 128 mg/dL — ABNORMAL HIGH (ref 70–99)
Potassium: 3.9 mmol/L (ref 3.5–5.1)
Sodium: 140 mmol/L (ref 135–145)

## 2020-06-06 LAB — URINALYSIS, ROUTINE W REFLEX MICROSCOPIC
Bilirubin Urine: NEGATIVE
Glucose, UA: >=500 mg/dL — AB
Ketones, ur: NEGATIVE mg/dL
Leukocytes,Ua: NEGATIVE
Nitrite: NEGATIVE
Protein, ur: 30 mg/dL — AB
Specific Gravity, Urine: >1.03 — ABNORMAL HIGH (ref 1.005–1.030)
pH: 5 (ref 5.0–8.0)

## 2020-06-06 LAB — URINALYSIS, MICROSCOPIC (REFLEX): WBC, UA: NONE SEEN WBC/hpf (ref 0–5)

## 2020-06-06 NOTE — ED Triage Notes (Addendum)
Per brother/caretaker pt with weakness, increase in urinary incontinence, "sitting with a blank stare open mouth"- sx x 1 week-today he found pt on the floor of his home-pt was "cognitive but zero mobility"-states pt had neuro work up for parkinson's and it was ruled out ~6 mos ago-reports pt has had decrease in gabapentin-pt does live independent-pt to triage in w/c-NAD-pt states he has lower back pain

## 2020-06-07 ENCOUNTER — Encounter (HOSPITAL_BASED_OUTPATIENT_CLINIC_OR_DEPARTMENT_OTHER): Payer: Self-pay | Admitting: Emergency Medicine

## 2020-06-07 DIAGNOSIS — G9340 Encephalopathy, unspecified: Secondary | ICD-10-CM | POA: Diagnosis present

## 2020-06-07 LAB — LACTIC ACID, PLASMA: Lactic Acid, Venous: 1 mmol/L (ref 0.5–1.9)

## 2020-06-07 LAB — CBC WITH DIFFERENTIAL/PLATELET
Abs Immature Granulocytes: 0.07 10*3/uL (ref 0.00–0.07)
Basophils Absolute: 0.1 10*3/uL (ref 0.0–0.1)
Basophils Relative: 0 %
Eosinophils Absolute: 0.1 10*3/uL (ref 0.0–0.5)
Eosinophils Relative: 1 %
HCT: 35.4 % — ABNORMAL LOW (ref 39.0–52.0)
Hemoglobin: 11.7 g/dL — ABNORMAL LOW (ref 13.0–17.0)
Immature Granulocytes: 1 %
Lymphocytes Relative: 15 %
Lymphs Abs: 1.7 10*3/uL (ref 0.7–4.0)
MCH: 28.6 pg (ref 26.0–34.0)
MCHC: 33.1 g/dL (ref 30.0–36.0)
MCV: 86.6 fL (ref 80.0–100.0)
Monocytes Absolute: 1.1 10*3/uL — ABNORMAL HIGH (ref 0.1–1.0)
Monocytes Relative: 10 %
Neutro Abs: 8.4 10*3/uL — ABNORMAL HIGH (ref 1.7–7.7)
Neutrophils Relative %: 73 %
Platelets: 335 10*3/uL (ref 150–400)
RBC: 4.09 MIL/uL — ABNORMAL LOW (ref 4.22–5.81)
RDW: 13.5 % (ref 11.5–15.5)
WBC: 11.4 10*3/uL — ABNORMAL HIGH (ref 4.0–10.5)
nRBC: 0 % (ref 0.0–0.2)

## 2020-06-07 LAB — COMPREHENSIVE METABOLIC PANEL
ALT: 31 U/L (ref 0–44)
AST: 66 U/L — ABNORMAL HIGH (ref 15–41)
Albumin: 3.7 g/dL (ref 3.5–5.0)
Alkaline Phosphatase: 47 U/L (ref 38–126)
Anion gap: 11 (ref 5–15)
BUN: 25 mg/dL — ABNORMAL HIGH (ref 8–23)
CO2: 22 mmol/L (ref 22–32)
Calcium: 9.3 mg/dL (ref 8.9–10.3)
Chloride: 105 mmol/L (ref 98–111)
Creatinine, Ser: 1.12 mg/dL (ref 0.61–1.24)
GFR calc Af Amer: >60 mL/min (ref >60)
GFR calc non Af Amer: >60 mL/min (ref >60)
Glucose, Bld: 127 mg/dL — ABNORMAL HIGH (ref 70–99)
Potassium: 3.6 mmol/L (ref 3.5–5.1)
Sodium: 138 mmol/L (ref 135–145)
Total Bilirubin: 0.6 mg/dL (ref 0.3–1.2)
Total Protein: 7.3 g/dL (ref 6.5–8.1)

## 2020-06-07 LAB — FERRITIN: Ferritin: 131 ng/mL (ref 24–336)

## 2020-06-07 LAB — FIBRINOGEN: Fibrinogen: 656 mg/dL — ABNORMAL HIGH (ref 210–475)

## 2020-06-07 LAB — C-REACTIVE PROTEIN: CRP: 5.7 mg/dL — ABNORMAL HIGH (ref <1.0)

## 2020-06-07 LAB — PROCALCITONIN: Procalcitonin: 0.4 ng/mL

## 2020-06-07 LAB — LACTATE DEHYDROGENASE: LDH: 240 U/L — ABNORMAL HIGH (ref 98–192)

## 2020-06-07 LAB — D-DIMER, QUANTITATIVE: D-Dimer, Quant: 2.79 ug/mL-FEU — ABNORMAL HIGH (ref 0.00–0.50)

## 2020-06-07 LAB — TRIGLYCERIDES: Triglycerides: 79 mg/dL (ref <150)

## 2020-06-07 LAB — SARS CORONAVIRUS 2 BY RT PCR (HOSPITAL ORDER, PERFORMED IN ~~LOC~~ HOSPITAL LAB): SARS Coronavirus 2: POSITIVE — AB

## 2020-06-07 MED ORDER — SODIUM CHLORIDE 0.9 % IV SOLN
100.0000 mg | INTRAVENOUS | Status: AC
  Administered 2020-06-07: 02:00:00 100 mg via INTRAVENOUS
  Filled 2020-06-07 (×2): qty 20

## 2020-06-07 MED ORDER — DEXAMETHASONE SODIUM PHOSPHATE 10 MG/ML IJ SOLN
6.0000 mg | Freq: Once | INTRAMUSCULAR | Status: AC
  Administered 2020-06-07: 03:00:00 6 mg via INTRAVENOUS
  Filled 2020-06-07: qty 1

## 2020-06-07 MED ORDER — SODIUM CHLORIDE 0.9 % IV SOLN
100.0000 mg | Freq: Every day | INTRAVENOUS | Status: DC
  Administered 2020-06-08: 09:00:00 100 mg via INTRAVENOUS
  Filled 2020-06-07: qty 20

## 2020-06-07 NOTE — ED Notes (Signed)
Pt on admission VS schedule.

## 2020-06-07 NOTE — ED Provider Notes (Signed)
[facility] EMERGENCY DEPARTMENT Provider Note   CSN: [id number] Arrival date & time: 06/06/20  1820     History Chief Complaint  Patient presents with  . Weakness    Asier Desroches is a 69 y.o. male.  The history is provided by a relative. The history is limited by the condition of the patient.  Weakness Severity:  Moderate Onset quality:  Gradual Timing:  Constant Progression:  Worsening Chronicity:  Recurrent Context: not change in medication and not urinary tract infection   Relieved by:  Nothing Worsened by:  Nothing Ineffective treatments:  None tried Associated symptoms: no cough, no fever and no vomiting   Associated symptoms comment:  Found down.  Staring into space with mouth open.  Not walking well  Risk factors: no anemia   Patient with schizophrenia presents with global weakness and being found down after a vacation with family.       Past Medical History:  Diagnosis Date  . Diabetes mellitus without complication (HCC)   . Hypertension   . Prostate disorder   . Schizophrenia (HCC)   . Seizures [facility])     Patient Active Problem List   Diagnosis Date Noted  . Acute encephalopathy 06/07/2020  . Sepsis (HCC) 11/16/2016  . Controlled diabetes mellitus type II without complication (HCC) 11/16/2016  . Hyperlipemia 11/16/2016  . Hypertension 11/16/2016  . Obesity, Class II, BMI 35-39.9, with comorbidity 11/16/2016  . Schizophrenia (HCC) 11/16/2016  . Syncope 11/16/2016  . Diarrhea 11/16/2016  . Chronic bilateral low back pain without sciatica 02/21/2016    History reviewed. No pertinent surgical history.     History reviewed. No pertinent family history.  Social History   Tobacco Use  . Smoking status: Never Smoker  . Smokeless tobacco: Never Used  Vaping Use  . Vaping Use: Never used  Substance Use Topics  . Alcohol use: No  . Drug use: Never    Home Medications Prior to Admission medications   Medication Sig Start Date End  Date Taking? Authorizing Provider  amLODipine (NORVASC) 10 MG tablet Take 10 mg by mouth daily.    [provider]  atorvastatin (LIPITOR) 20 MG tablet Take 20 mg by mouth daily.    [provider]  benztropine (COGENTIN) 1 MG tablet Take 0.5 mg by mouth daily.    [provider]  fluPHENAZine (PROLIXIN) 5 MG tablet Take 5 mg by mouth at bedtime.    [provider]  gabapentin (NEURONTIN) 300 MG capsule Take 300 mg by mouth 3 (three) times daily.    [provider]  lisinopril (PRINIVIL,ZESTRIL) 40 MG tablet Take 40 mg by mouth daily.    [provider]  sitaGLIPtin-metformin (JANUMET) 50-1000 MG tablet Take 1 tablet by mouth 2 (two) times daily with a meal.    [provider]  traZODone (DESYREL) 100 MG tablet Take 100 mg by mouth at bedtime.    [provider]    Allergies    Patient has no known allergies.  Review of Systems   Review of Systems  Unable to perform ROS: Acuity of condition  Constitutional: Negative for fever.  Respiratory: Negative for cough.   Gastrointestinal: Negative for vomiting.  Neurological: Positive for weakness.    Physical Exam Updated Vital Signs BP (!) 146/82   Pulse 86   Temp 98 F (36.7 C) (Oral)   Resp (!) 22   SpO2 97%   Physical Exam Vitals and nursing note reviewed.  Constitutional:  General: He is not in acute distress.    Appearance: Normal appearance.  HENT:     Head: Normocephalic and atraumatic.     Nose: Nose normal.  Eyes:     Conjunctiva/sclera: Conjunctivae normal.     Pupils: Pupils are equal, round, and reactive to light.  Cardiovascular:     Rate and Rhythm: Normal rate and regular rhythm.     Pulses: Normal pulses.     Heart sounds: Normal heart sounds.  Pulmonary:     Effort: Pulmonary effort is normal.     Breath sounds: Normal breath sounds.  Abdominal:     General: Abdomen is flat. Bowel sounds are normal.     Palpations: Abdomen is  soft.     Tenderness: There is no abdominal tenderness. There is no guarding or rebound.  Musculoskeletal:        General: Normal range of motion.     Cervical back: Normal range of motion and neck supple.  Skin:    General: Skin is warm and dry.     Capillary Refill: Capillary refill takes less than 2 seconds.  Neurological:     Mental Status: He is alert.     Sensory: No sensory deficit.     Motor: No weakness.     Deep Tendon Reflexes: Reflexes normal.  Psychiatric:        Mood and Affect: Mood normal.        Behavior: Behavior normal.     ED Results / Procedures / Treatments   Labs (all labs ordered are listed, but only abnormal results are displayed) Results for orders placed or performed during the hospital encounter of 06/06/20  SARS Coronavirus 2 by RT PCR (hospital order, [facility]) Nasopharyngeal Nasopharyngeal Swab   Specimen: Nasopharyngeal Swab  Result Value Ref Range   SARS Coronavirus 2 POSITIVE (A) NEGATIVE  CBC with Differential  Result Value Ref Range   WBC 16.3 (H) 4.0 - 10.5 K/uL   RBC 4.31 4.22 - 5.81 MIL/uL   Hemoglobin 12.5 (L) 13.0 - 17.0 g/dL   HCT 16.137.8 (L) 39 - 52 %   MCV 87.7 80.0 - 100.0 fL   MCH 29.0 26.0 - 34.0 pg   MCHC 33.1 30.0 - 36.0 g/dL   RDW 09.613.7 04.511.5 - 40.915.5 %   Platelets 369 150 - 400 K/uL   nRBC 0.0 0.0 - 0.2 %   Neutrophils Relative % 84 %   Neutro Abs 13.7 (H) 1.7 - 7.7 K/uL   Lymphocytes Relative 8 %   Lymphs Abs 1.3 0.7 - 4.0 K/uL   Monocytes Relative 7 %   Monocytes Absolute 1.2 (H) 0 - 1 K/uL   Eosinophils Relative 0 %   Eosinophils Absolute 0.0 0 - 0 K/uL   Basophils Relative 0 %   Basophils Absolute 0.0 0 - 0 K/uL   Immature Granulocytes 1 %   Abs Immature Granulocytes 0.14 (H) 0.00 - 0.07 K/uL  Basic metabolic panel  Result Value Ref Range   Sodium 140 135 - 145 mmol/L   Potassium 3.9 3.5 - 5.1 mmol/L   Chloride 105 98 - 111 mmol/L   CO2 22 22 - 32 mmol/L   Glucose, Bld 128 (H) 70 - 99  mg/dL   BUN 26 (H) 8 - 23 mg/dL   Creatinine, Ser 8.111.36 (H) 0.61 - 1.24 mg/dL   Calcium 9.6 8.9 - 91.410.3 mg/dL   GFR calc non Af Amer 53 (L) >60 mL/min  GFR calc Af Amer >60 >60 mL/min   Anion gap 13 5 - 15  Urinalysis, Routine w reflex microscopic Urine, Clean Catch  Result Value Ref Range   Color, Urine YELLOW YELLOW   APPearance CLOUDY (A) CLEAR   Specific Gravity, Urine >1.030 (H) 1.005 - 1.030   pH 5.0 5.0 - 8.0   Glucose, UA >=500 (A) NEGATIVE mg/dL   Hgb urine dipstick LARGE (A) NEGATIVE   Bilirubin Urine NEGATIVE NEGATIVE   Ketones, ur NEGATIVE NEGATIVE mg/dL   Protein, ur 30 (A) NEGATIVE mg/dL   Nitrite NEGATIVE NEGATIVE   Leukocytes,Ua NEGATIVE NEGATIVE  Urinalysis, Microscopic (reflex)  Result Value Ref Range   RBC / HPF 11-20 0 - 5 RBC/hpf   WBC, UA NONE SEEN 0 - 5 WBC/hpf   Bacteria, UA FEW (A) NONE SEEN   Squamous Epithelial / LPF 6-10 0 - 5   Hyaline Casts, UA PRESENT   CBC WITH DIFFERENTIAL  Result Value Ref Range   WBC 11.4 (H) 4.0 - 10.5 K/uL   RBC 4.09 (L) 4.22 - 5.81 MIL/uL   Hemoglobin 11.7 (L) 13.0 - 17.0 g/dL   HCT 13.2 (L) 39 - 52 %   MCV 86.6 80.0 - 100.0 fL   MCH 28.6 26.0 - 34.0 pg   MCHC 33.1 30.0 - 36.0 g/dL   RDW 44.0 10.2 - 72.5 %   Platelets 335 150 - 400 K/uL   nRBC 0.0 0.0 - 0.2 %   Neutrophils Relative % 73 %   Neutro Abs 8.4 (H) 1.7 - 7.7 K/uL   Lymphocytes Relative 15 %   Lymphs Abs 1.7 0.7 - 4.0 K/uL   Monocytes Relative 10 %   Monocytes Absolute 1.1 (H) 0 - 1 K/uL   Eosinophils Relative 1 %   Eosinophils Absolute 0.1 0 - 0 K/uL   Basophils Relative 0 %   Basophils Absolute 0.1 0 - 0 K/uL   Immature Granulocytes 1 %   Abs Immature Granulocytes 0.07 0.00 - 0.07 K/uL   CT Head Wo Contrast  Result Date: 06/06/2020 CLINICAL DATA:  Weakness, urinary incontinence, found down EXAM: CT HEAD WITHOUT CONTRAST TECHNIQUE: Contiguous axial images were obtained from the base of the skull through the vertex without intravenous contrast.  COMPARISON:  11/16/2016 FINDINGS: Brain: Scattered hypodensities throughout the periventricular white matter consistent with chronic small vessel ischemic changes. No signs of acute infarct or hemorrhage. The lateral ventricles and midline structures are unremarkable. No acute extra-axial fluid collections. No mass effect. Vascular: No hyperdense vessel or unexpected calcification. Skull: Normal. Negative for fracture or focal lesion. Sinuses/Orbits: No acute finding. Other: None. IMPRESSION: 1. Chronic small-vessel ischemic changes throughout the white matter. No acute intracranial process. Electronically Signed   By: Sharlet Salina M.D.   On: 06/06/2020 23:35   DG Chest Portable 1 View  Result Date: 06/06/2020 CLINICAL DATA:  Altered EXAM: PORTABLE CHEST 1 VIEW COMPARISON:  Chest x-ray 11/18/2016 FINDINGS: No focal opacity or pleural effusion. Stable cardiomediastinal silhouette with aortic atherosclerosis. No pneumothorax. IMPRESSION: No active disease. Electronically Signed   By: Jasmine Pang M.D.   On: 06/06/2020 23:32    Radiology CT Head Wo Contrast  Result Date: 06/06/2020 CLINICAL DATA:  Weakness, urinary incontinence, found down EXAM: CT HEAD WITHOUT CONTRAST TECHNIQUE: Contiguous axial images were obtained from the base of the skull through the vertex without intravenous contrast. COMPARISON:  11/16/2016 FINDINGS: Brain: Scattered hypodensities throughout the periventricular white matter consistent with chronic small vessel ischemic changes. No  signs of acute infarct or hemorrhage. The lateral ventricles and midline structures are unremarkable. No acute extra-axial fluid collections. No mass effect. Vascular: No hyperdense vessel or unexpected calcification. Skull: Normal. Negative for fracture or focal lesion. Sinuses/Orbits: No acute finding. Other: None. IMPRESSION: 1. Chronic small-vessel ischemic changes throughout the white matter. No acute intracranial process. Electronically Signed    By: Sharlet Salina M.D.   On: 06/06/2020 23:35   DG Chest Portable 1 View  Result Date: 06/06/2020 CLINICAL DATA:  Altered EXAM: PORTABLE CHEST 1 VIEW COMPARISON:  Chest x-ray 11/18/2016 FINDINGS: No focal opacity or pleural effusion. Stable cardiomediastinal silhouette with aortic atherosclerosis. No pneumothorax. IMPRESSION: No active disease. Electronically Signed   By: Jasmine Pang M.D.   On: 06/06/2020 23:32    Procedures Procedures (including critical care time)  Medications Ordered in ED Medications  remdesivir 100 mg in sodium chloride 0.9 % 100 mL IVPB (100 mg Intravenous New Bag/Given 06/07/20 0226)    Followed by  remdesivir 100 mg in sodium chloride 0.9 % 100 mL IVPB (has no administration in time range)  dexamethasone (DECADRON) injection 6 mg (6 mg Intravenous Given 06/07/20 0231)    ED Course  I have reviewed the triage vital signs and the nursing notes.  Pertinent labs & imaging results that were available during my care of the patient were reviewed by me and considered in my medical decision making (see chart for details).    Global weakness.  Likely secondary to COVID.  I will admit to medicine and start appropriate therapy.    Marcelle Bebout was evaluated in Emergency Department on 06/07/2020 for the symptoms described in the history of present illness. He was evaluated in the context of the global COVID-19 pandemic, which necessitated consideration that the patient might be at risk for infection with the SARS-CoV-2 virus that causes COVID-19. Institutional protocols and algorithms that pertain to the evaluation of patients at risk for COVID-19 are in a state of rapid change based on information released by regulatory bodies including the CDC and federal and state organizations. These policies and algorithms were followed during the patient's care in the ED.  Final Clinical Impression(s) / ED Diagnoses Final diagnoses:  Altered mental status, unspecified altered mental  status type  COVID-19    Admit to medicine    Evaristo Tsuda, MD 06/07/20 9628

## 2020-06-08 ENCOUNTER — Inpatient Hospital Stay (HOSPITAL_COMMUNITY)
Admit: 2020-06-08 | Discharge: 2020-06-08 | Disposition: A | Discharge status: Home / Self Care | Payer: Medicare Other | Attending: Internal Medicine | Admitting: Internal Medicine

## 2020-06-08 DIAGNOSIS — Z713 Dietary counseling and surveillance: Secondary | ICD-10-CM | POA: Diagnosis not present

## 2020-06-08 DIAGNOSIS — F209 Schizophrenia, unspecified: Secondary | ICD-10-CM | POA: Diagnosis present

## 2020-06-08 DIAGNOSIS — E119 Type 2 diabetes mellitus without complications: Secondary | ICD-10-CM | POA: Diagnosis present

## 2020-06-08 DIAGNOSIS — I10 Essential (primary) hypertension: Secondary | ICD-10-CM

## 2020-06-08 DIAGNOSIS — U071 COVID-19: Principal | ICD-10-CM

## 2020-06-08 DIAGNOSIS — R0902 Hypoxemia: Secondary | ICD-10-CM | POA: Diagnosis present

## 2020-06-08 DIAGNOSIS — N179 Acute kidney failure, unspecified: Secondary | ICD-10-CM | POA: Diagnosis present

## 2020-06-08 DIAGNOSIS — R569 Unspecified convulsions: Secondary | ICD-10-CM | POA: Diagnosis not present

## 2020-06-08 DIAGNOSIS — W1830XA Fall on same level, unspecified, initial encounter: Secondary | ICD-10-CM | POA: Diagnosis present

## 2020-06-08 DIAGNOSIS — R6510 Systemic inflammatory response syndrome (SIRS) of non-infectious origin without acute organ dysfunction: Secondary | ICD-10-CM | POA: Diagnosis present

## 2020-06-08 DIAGNOSIS — R2681 Unsteadiness on feet: Secondary | ICD-10-CM | POA: Diagnosis present

## 2020-06-08 DIAGNOSIS — R296 Repeated falls: Secondary | ICD-10-CM | POA: Diagnosis present

## 2020-06-08 DIAGNOSIS — R531 Weakness: Secondary | ICD-10-CM | POA: Diagnosis not present

## 2020-06-08 DIAGNOSIS — Z6828 Body mass index (BMI) 28.0-28.9, adult: Secondary | ICD-10-CM | POA: Diagnosis not present

## 2020-06-08 DIAGNOSIS — E860 Dehydration: Secondary | ICD-10-CM | POA: Diagnosis present

## 2020-06-08 DIAGNOSIS — E785 Hyperlipidemia, unspecified: Secondary | ICD-10-CM

## 2020-06-08 DIAGNOSIS — Z79899 Other long term (current) drug therapy: Secondary | ICD-10-CM | POA: Diagnosis not present

## 2020-06-08 DIAGNOSIS — Z7984 Long term (current) use of oral hypoglycemic drugs: Secondary | ICD-10-CM | POA: Diagnosis not present

## 2020-06-08 DIAGNOSIS — Z79891 Long term (current) use of opiate analgesic: Secondary | ICD-10-CM | POA: Diagnosis not present

## 2020-06-08 DIAGNOSIS — Z789 Other specified health status: Secondary | ICD-10-CM | POA: Diagnosis not present

## 2020-06-08 DIAGNOSIS — E669 Obesity, unspecified: Secondary | ICD-10-CM | POA: Diagnosis present

## 2020-06-08 DIAGNOSIS — G9340 Encephalopathy, unspecified: Secondary | ICD-10-CM | POA: Diagnosis present

## 2020-06-08 LAB — CBC
HCT: 40 % (ref 39.0–52.0)
Hemoglobin: 13.5 g/dL (ref 13.0–17.0)
MCH: 29.3 pg (ref 26.0–34.0)
MCHC: 33.8 g/dL (ref 30.0–36.0)
MCV: 86.8 fL (ref 80.0–100.0)
Platelets: 374 10*3/uL (ref 150–400)
RBC: 4.61 MIL/uL (ref 4.22–5.81)
RDW: 13.7 % (ref 11.5–15.5)
WBC: 13.7 10*3/uL — ABNORMAL HIGH (ref 4.0–10.5)
nRBC: 0 % (ref 0.0–0.2)

## 2020-06-08 LAB — CBC WITH DIFFERENTIAL/PLATELET
Abs Immature Granulocytes: 0.08 10*3/uL — ABNORMAL HIGH (ref 0.00–0.07)
Basophils Absolute: 0.1 10*3/uL (ref 0.0–0.1)
Basophils Relative: 1 %
Eosinophils Absolute: 0.1 10*3/uL (ref 0.0–0.5)
Eosinophils Relative: 1 %
HCT: 37.5 % — ABNORMAL LOW (ref 39.0–52.0)
Hemoglobin: 12.5 g/dL — ABNORMAL LOW (ref 13.0–17.0)
Immature Granulocytes: 1 %
Lymphocytes Relative: 14 %
Lymphs Abs: 1.7 10*3/uL (ref 0.7–4.0)
MCH: 28.8 pg (ref 26.0–34.0)
MCHC: 33.3 g/dL (ref 30.0–36.0)
MCV: 86.4 fL (ref 80.0–100.0)
Monocytes Absolute: 0.9 10*3/uL (ref 0.1–1.0)
Monocytes Relative: 7 %
Neutro Abs: 8.9 10*3/uL — ABNORMAL HIGH (ref 1.7–7.7)
Neutrophils Relative %: 76 %
Platelets: 350 10*3/uL (ref 150–400)
RBC: 4.34 MIL/uL (ref 4.22–5.81)
RDW: 13.5 % (ref 11.5–15.5)
WBC: 11.6 10*3/uL — ABNORMAL HIGH (ref 4.0–10.5)
nRBC: 0 % (ref 0.0–0.2)

## 2020-06-08 LAB — COMPREHENSIVE METABOLIC PANEL
ALT: 27 U/L (ref 0–44)
AST: 39 U/L (ref 15–41)
Albumin: 3.3 g/dL — ABNORMAL LOW (ref 3.5–5.0)
Alkaline Phosphatase: 45 U/L (ref 38–126)
Anion gap: 9 (ref 5–15)
BUN: 22 mg/dL (ref 8–23)
CO2: 26 mmol/L (ref 22–32)
Calcium: 8.8 mg/dL — ABNORMAL LOW (ref 8.9–10.3)
Chloride: 107 mmol/L (ref 98–111)
Creatinine, Ser: 1.03 mg/dL (ref 0.61–1.24)
GFR calc Af Amer: >60 mL/min (ref >60)
GFR calc non Af Amer: >60 mL/min (ref >60)
Glucose, Bld: 124 mg/dL — ABNORMAL HIGH (ref 70–99)
Potassium: 4 mmol/L (ref 3.5–5.1)
Sodium: 142 mmol/L (ref 135–145)
Total Bilirubin: 0.3 mg/dL (ref 0.3–1.2)
Total Protein: 6.9 g/dL (ref 6.5–8.1)

## 2020-06-08 LAB — CREATININE, SERUM
Creatinine, Ser: 0.99 mg/dL (ref 0.61–1.24)
GFR calc Af Amer: >60 mL/min (ref >60)
GFR calc non Af Amer: >60 mL/min (ref >60)

## 2020-06-08 LAB — C-REACTIVE PROTEIN: CRP: 4.3 mg/dL — ABNORMAL HIGH (ref <1.0)

## 2020-06-08 LAB — GLUCOSE, CAPILLARY
Glucose-Capillary: 157 mg/dL — ABNORMAL HIGH (ref 70–99)
Glucose-Capillary: 300 mg/dL — ABNORMAL HIGH (ref 70–99)
Glucose-Capillary: 115 mg/dL — ABNORMAL HIGH (ref 70–99)

## 2020-06-08 LAB — D-DIMER, QUANTITATIVE: D-Dimer, Quant: 3.14 ug/mL-FEU — ABNORMAL HIGH (ref 0.00–0.50)

## 2020-06-08 LAB — ABO/RH: ABO/RH(D): AB POS

## 2020-06-08 MED ORDER — GABAPENTIN 300 MG PO CAPS: 300.0000 mg | ORAL_CAPSULE | Freq: Three times a day (TID) | ORAL | Status: DC

## 2020-06-08 MED ORDER — GABAPENTIN 300 MG PO CAPS
300.0000 mg | ORAL_CAPSULE | Freq: Every day | ORAL | Status: DC
  Administered 2020-06-08 – 2020-06-13 (×6): 300 mg via ORAL
  Filled 2020-06-08 (×6): qty 1

## 2020-06-08 MED ORDER — INSULIN DETEMIR 100 UNIT/ML ~~LOC~~ SOLN
0.1500 [IU]/kg | Freq: Two times a day (BID) | SUBCUTANEOUS | Status: DC
  Administered 2020-06-08 – 2020-06-09 (×3): 14 [IU] via SUBCUTANEOUS
  Filled 2020-06-08 (×3): qty 0.14

## 2020-06-08 MED ORDER — INSULIN ASPART 100 UNIT/ML ~~LOC~~ SOLN
0.0000 [IU] | Freq: Three times a day (TID) | SUBCUTANEOUS | Status: DC
  Administered 2020-06-08: 11 [IU] via SUBCUTANEOUS
  Administered 2020-06-09: 4 [IU] via SUBCUTANEOUS
  Administered 2020-06-09: 3 [IU] via SUBCUTANEOUS
  Administered 2020-06-10: 11 [IU] via SUBCUTANEOUS
  Administered 2020-06-10 – 2020-06-11 (×2): 7 [IU] via SUBCUTANEOUS
  Administered 2020-06-11: 4 [IU] via SUBCUTANEOUS
  Administered 2020-06-12: 3 [IU] via SUBCUTANEOUS
  Administered 2020-06-12: 7 [IU] via SUBCUTANEOUS
  Administered 2020-06-13: 3 [IU] via SUBCUTANEOUS
  Administered 2020-06-13: 1 [IU] via SUBCUTANEOUS

## 2020-06-08 MED ORDER — DEXAMETHASONE SODIUM PHOSPHATE 10 MG/ML IJ SOLN
INTRAMUSCULAR | Status: AC
  Filled 2020-06-08: qty 1

## 2020-06-08 MED ORDER — METHYLPREDNISOLONE SODIUM SUCC 40 MG IJ SOLR
0.2500 mg/kg | Freq: Two times a day (BID) | INTRAMUSCULAR | Status: DC
  Administered 2020-06-09 – 2020-06-10 (×3): 22.8 mg via INTRAVENOUS
  Filled 2020-06-08 (×3): qty 1

## 2020-06-08 MED ORDER — LINAGLIPTIN 5 MG PO TABS: 5.0000 mg | ORAL_TABLET | Freq: Every day | ORAL | Status: DC

## 2020-06-08 MED ORDER — SODIUM CHLORIDE 0.9 % IV SOLN
100.0000 mg | Freq: Every day | INTRAVENOUS | Status: AC
  Administered 2020-06-09 – 2020-06-11 (×3): 100 mg via INTRAVENOUS
  Filled 2020-06-08 (×3): qty 20

## 2020-06-08 MED ORDER — TRAZODONE HCL 50 MG PO TABS
50.0000 mg | ORAL_TABLET | Freq: Every day | ORAL | Status: DC
  Administered 2020-06-08 – 2020-06-13 (×6): 50 mg via ORAL
  Filled 2020-06-08 (×6): qty 1

## 2020-06-08 MED ORDER — POLYETHYLENE GLYCOL 3350 17 G PO PACK: 17.0000 g | PACK | Freq: Every day | ORAL | Status: DC | PRN

## 2020-06-08 MED ORDER — AMLODIPINE BESYLATE 10 MG PO TABS: 10.0000 mg | ORAL_TABLET | Freq: Every day | ORAL | Status: DC

## 2020-06-08 MED ORDER — FLUPHENAZINE HCL 5 MG PO TABS: 5.0000 mg | ORAL_TABLET | Freq: Every day | ORAL | Status: DC

## 2020-06-08 MED ORDER — LINAGLIPTIN 5 MG PO TABS
5.0000 mg | ORAL_TABLET | Freq: Every day | ORAL | Status: DC
  Administered 2020-06-08 – 2020-06-13 (×6): 5 mg via ORAL
  Filled 2020-06-08 (×6): qty 1

## 2020-06-08 MED ORDER — ACETAMINOPHEN 325 MG PO TABS
650.0000 mg | ORAL_TABLET | Freq: Four times a day (QID) | ORAL | Status: DC | PRN
  Administered 2020-06-09 (×2): 650 mg via ORAL
  Filled 2020-06-08 (×2): qty 2

## 2020-06-08 MED ORDER — FLUPHENAZINE HCL 5 MG PO TABS
5.0000 mg | ORAL_TABLET | Freq: Every day | ORAL | Status: DC
  Administered 2020-06-08 – 2020-06-13 (×6): 5 mg via ORAL
  Filled 2020-06-08 (×6): qty 1

## 2020-06-08 MED ORDER — LISINOPRIL 20 MG PO TABS
40.0000 mg | ORAL_TABLET | Freq: Every day | ORAL | Status: DC
  Administered 2020-06-08 – 2020-06-13 (×6): 40 mg via ORAL
  Filled 2020-06-08 (×6): qty 2

## 2020-06-08 MED ORDER — ATORVASTATIN CALCIUM 20 MG PO TABS
20.0000 mg | ORAL_TABLET | Freq: Every day | ORAL | Status: DC
  Administered 2020-06-08 – 2020-06-13 (×6): 20 mg via ORAL
  Filled 2020-06-08 (×6): qty 1

## 2020-06-08 MED ORDER — SODIUM CHLORIDE 0.9 % IV SOLN: 200.0000 mg | Freq: Once | INTRAVENOUS | Status: DC

## 2020-06-08 MED ORDER — DEXAMETHASONE SODIUM PHOSPHATE 10 MG/ML IJ SOLN
10.0000 mg | Freq: Once | INTRAMUSCULAR | Status: AC
  Administered 2020-06-08: 10 mg via INTRAVENOUS
  Filled 2020-06-08: qty 1

## 2020-06-08 MED ORDER — ALBUTEROL SULFATE HFA 108 (90 BASE) MCG/ACT IN AERS: 2.0000 | INHALATION_SPRAY | Freq: Four times a day (QID) | RESPIRATORY_TRACT | Status: DC | PRN

## 2020-06-08 MED ORDER — TRAZODONE HCL 100 MG PO TABS: 100.0000 mg | ORAL_TABLET | Freq: Every day | ORAL | Status: DC

## 2020-06-08 MED ORDER — SODIUM CHLORIDE 0.9 % IV SOLN
100.0000 mg | Freq: Once | INTRAVENOUS | Status: AC
  Administered 2020-06-08: 100 mg via INTRAVENOUS
  Filled 2020-06-08: qty 20

## 2020-06-08 MED ORDER — ATORVASTATIN CALCIUM 20 MG PO TABS: 20.0000 mg | ORAL_TABLET | Freq: Every day | ORAL | Status: DC

## 2020-06-08 MED ORDER — SODIUM CHLORIDE 0.9 % IV SOLN: 100.0000 mg | Freq: Every day | INTRAVENOUS | Status: DC

## 2020-06-08 MED ORDER — BENZTROPINE MESYLATE 0.5 MG PO TABS: 0.5000 mg | ORAL_TABLET | Freq: Every day | ORAL | Status: DC

## 2020-06-08 MED ORDER — BENZTROPINE MESYLATE 0.5 MG PO TABS
0.5000 mg | ORAL_TABLET | ORAL | Status: DC
  Administered 2020-06-08 – 2020-06-12 (×3): 0.5 mg via ORAL
  Filled 2020-06-08 (×4): qty 1

## 2020-06-08 MED ORDER — TAMSULOSIN HCL 0.4 MG PO CAPS
0.4000 mg | ORAL_CAPSULE | Freq: Every day | ORAL | Status: DC
  Administered 2020-06-08 – 2020-06-13 (×6): 0.4 mg via ORAL
  Filled 2020-06-08 (×6): qty 1

## 2020-06-08 MED ORDER — ENOXAPARIN SODIUM 40 MG/0.4ML ~~LOC~~ SOLN
40.0000 mg | SUBCUTANEOUS | Status: DC
  Administered 2020-06-08 – 2020-06-11 (×4): 40 mg via SUBCUTANEOUS
  Filled 2020-06-08 (×4): qty 0.4

## 2020-06-08 MED ORDER — LISINOPRIL 20 MG PO TABS: 40.0000 mg | ORAL_TABLET | Freq: Every day | ORAL | Status: DC

## 2020-06-08 MED ORDER — AMLODIPINE BESYLATE 10 MG PO TABS
10.0000 mg | ORAL_TABLET | Freq: Every day | ORAL | Status: DC
  Administered 2020-06-08 – 2020-06-13 (×6): 10 mg via ORAL
  Filled 2020-06-08 (×6): qty 1

## 2020-06-08 MED ORDER — GABAPENTIN 300 MG PO CAPS: 300.0000 mg | ORAL_CAPSULE | Freq: Every day | ORAL | Status: DC

## 2020-06-08 NOTE — ED Notes (Signed)
Attempt to call report, nurse unavailable at this time, requests call back in 15 min.  Made aware carelink at bedside now.

## 2020-06-08 NOTE — H&P (Signed)
History and Physical        Hospital Admission Note Date: 06/08/2020  Patient name: Jaime Lopez Medical record number: [id number] Date of birth: [birth year] Age: 69 y.o. Gender: male  PCP: Haimes, [name], MD  Patient coming from: home Lives with: alone At baseline, ambulates: independently  Chief Complaint    Chief Complaint  Patient presents with  . Weakness      HPI:   This is a 69 year old male with a history of schizophrenia, diabetes, hypertension, hyperlipidemia, remote seizures 10 years ago not on AEDs who has been vaccinated against COVID 19 who was noted to have an increase in urinary incontinence, blank stares and generalized weakness x 1 week by his brother who also apparently found the patient on the floor of his home on 8/31, prompting presentation to [facility]. The patient was diagnosed with COVID 19.   Currently, the patient states he recently traveled out of town and soon after began feeling ill. He has had a dry cough and generalized weakness most notable in his upper extremities. Denies chest pain, palpitations, shortness of breath, nausea, vomiting, leg swelling or any other discomfort.   ED Course: Initially hemodynamically stable on room air. CXR negative, CT head without acute changes but noted chronic small vessel ischemic changes. Initial labs notable for LDH 240, CRP 5.7, Lactic acid 1.0, WBC 11.4->11.6, D Dimer 2.79 -> 3.14, fibrinogen 656. He was given dexamethasone and remdesivir. Plan was to potentially discharge from [facility] ED today, however patient had an unsteady gait and desatted to 89%, prompting transfer to [facility] and further treatment for COVID.  Vitals:   06/08/20 0924 06/08/20 1032  BP: 138/65 134/77  Pulse: 83 86  Resp: 20 20  Temp: 98.4 F (36.9 C) 98.3 F (36.8 C)  SpO2: 97% 96%     Review of Systems:  Review of Systems  Constitutional:  Negative for chills and fever.  Respiratory: Positive for cough. Negative for sputum production, shortness of breath and wheezing.   Cardiovascular: Negative for chest pain and leg swelling.  Gastrointestinal: Negative for nausea and vomiting.  Genitourinary: Negative for dysuria.  Musculoskeletal: Positive for back pain. Negative for myalgias.  Neurological: Positive for weakness. Negative for sensory change.  Endo/Heme/Allergies: Does not bruise/bleed easily.  All other systems reviewed and are negative.   Medical/Social/Family History   Past Medical History: Past Medical History:  Diagnosis Date  . Diabetes mellitus without complication (HCC)   . Hypertension   . Prostate disorder   . Schizophrenia (HCC)   . Seizures (HCC)     History reviewed. No pertinent surgical history.  Medications: Prior to Admission medications   Medication Sig Start Date End Date Taking? Authorizing Provider  amLODipine (NORVASC) 10 MG tablet Take 10 mg by mouth daily.    [provider]  atorvastatin (LIPITOR) 20 MG tablet Take 20 mg by mouth daily.    [provider]  benztropine (COGENTIN) 1 MG tablet Take 0.5 mg by mouth daily.    [provider]  fluPHENAZine (PROLIXIN) 5 MG tablet Take 5 mg by mouth at bedtime.    [provider]  gabapentin (NEURONTIN) 300 MG capsule Take 300 mg by mouth 3 (three)  times daily.    [provider]  lisinopril (PRINIVIL,ZESTRIL) 40 MG tablet Take 40 mg by mouth daily.    [provider]  sitaGLIPtin-metformin (JANUMET) 50-1000 MG tablet Take 1 tablet by mouth 2 (two) times daily with a meal.    [provider]  traZODone (DESYREL) 100 MG tablet Take 100 mg by mouth at bedtime.    [provider]    Allergies:  No Known Allergies  Social History:  reports that he has never smoked. He has never used smokeless tobacco. He reports that he does not drink alcohol and does not use  drugs.  Family History: History reviewed. No pertinent family history.   Objective   Physical Exam: Blood pressure 134/77, pulse 86, temperature 98.3 F (36.8 C), temperature source Oral, resp. rate 20, SpO2 96 %.  Physical Exam Vitals and nursing note reviewed.  Constitutional:      Appearance: Normal appearance.  HENT:     Head: Normocephalic and atraumatic.  Eyes:     Conjunctiva/sclera: Conjunctivae normal.  Cardiovascular:     Rate and Rhythm: Normal rate.     Pulses: Normal pulses.  Pulmonary:     Effort: Pulmonary effort is normal. No respiratory distress.     Comments: SpO2 on room air 99%, drops to 94% with minimal conversation.  Abdominal:     General: Abdomen is flat.     Palpations: Abdomen is soft.  Musculoskeletal:        General: No swelling or tenderness.  Skin:    Coloration: Skin is not jaundiced or pale.  Neurological:     Mental Status: He is alert. Mental status is at baseline.  Psychiatric:        Mood and Affect: Affect is flat.        Speech: Speech normal.     LABS on Admission: I have personally reviewed all the labs and imaging below    Basic Metabolic Panel: Recent Labs  Lab 06/07/20 0235 06/08/20 0743  NA 138 142  K 3.6 4.0  CL 105 107  CO2 22 26  GLUCOSE 127* 124*  BUN 25* 22  CREATININE 1.12 1.03  CALCIUM 9.3 8.8*   Liver Function Tests: Recent Labs  Lab 06/07/20 0235 06/08/20 0743  AST 66* 39  ALT 31 27  ALKPHOS 47 45  BILITOT 0.6 0.3  PROT 7.3 6.9  ALBUMIN 3.7 3.3*   No results for input(s): LIPASE, AMYLASE in the last 168 hours. No results for input(s): AMMONIA in the last 168 hours. CBC: Recent Labs  Lab 06/07/20 0235 06/07/20 0235 06/08/20 0743  WBC 11.4*  --  11.6*  NEUTROABS 8.4*   < > 8.9*  HGB 11.7*  --  12.5*  HCT 35.4*  --  37.5*  MCV 86.6   < > 86.4  PLT 335  --  350   < > = values in this interval not displayed.   Cardiac Enzymes: No results for input(s): CKTOTAL, CKMB, CKMBINDEX,  TROPONINI in the last 168 hours. BNP: Invalid input(s): POCBNP CBG: Recent Labs  Lab 06/08/20 1223  GLUCAP 157*    Radiological Exams on Admission:  CT Head Wo Contrast  Result Date: 06/06/2020 CLINICAL DATA:  Weakness, urinary incontinence, found down EXAM: CT HEAD WITHOUT CONTRAST TECHNIQUE: Contiguous axial images were obtained from the base of the skull through the vertex without intravenous contrast. COMPARISON:  11/16/2016 FINDINGS: Brain: Scattered hypodensities throughout the periventricular white matter consistent with chronic small vessel ischemic changes. No  signs of acute infarct or hemorrhage. The lateral ventricles and midline structures are unremarkable. No acute extra-axial fluid collections. No mass effect. Vascular: No hyperdense vessel or unexpected calcification. Skull: Normal. Negative for fracture or focal lesion. Sinuses/Orbits: No acute finding. Other: None. IMPRESSION: 1. Chronic small-vessel ischemic changes throughout the white matter. No acute intracranial process. Electronically Signed   By: Sharlet Salina M.D.   On: 06/06/2020 23:35   DG Chest Portable 1 View  Result Date: 06/06/2020 CLINICAL DATA:  Altered EXAM: PORTABLE CHEST 1 VIEW COMPARISON:  Chest x-ray 11/18/2016 FINDINGS: No focal opacity or pleural effusion. Stable cardiomediastinal silhouette with aortic atherosclerosis. No pneumothorax. IMPRESSION: No active disease. Electronically Signed   By: Jasmine Pang M.D.   On: 06/06/2020 23:32      EKG: Independently reviewed.    A & P   Principal Problem:   Generalized weakness Active Problems:   Type 2 diabetes mellitus without complication (HCC)   Hyperlipemia   Hypertension   Schizophrenia (HCC)   Acute encephalopathy   SIRS (systemic inflammatory response syndrome) (HCC)   COVID-19 virus infection   1. SIRS  Generalized weakness secondary to COVID 19 a. SIRS criteria: leukocytosis (11.6), hypoxia (SpO2 94% on room air with conversation,  89% on ambulation) b. Elevated inflammatory markers: CRP 5.7, LDH 240, D Dimer 2.79->3.14, Fibrinogen 656 c. Procalcitonin 0.4, unknown significance as he is afebrile and without obvious sign of bacterial infection - hold antibiotics d. Has been vaccinated against COVID 19 e. Will continue with steroids and remdesivir for now since he has slight hypoxia on ambulation and add baricitinib if O2 requirements increase f. PT eval g. Check orthostatics - fluids if positive  2. Diabetes a. Hold home Janumet b. Check HbA1c c. Insulin per COVID 19 order set, steroid protocol  3. Acute encephalopathy, unknown type, possible seizures? a. Seems at/near baseline on exam without any concerning features currently b. Reportedly had been found down with increased urinary incontinence and blank stares and has a remote history of seizures 10+ years ago not on AEDs c. EEG  4. Social Issues a. Long discussion with patient's brother over the phone. Per brother: he believes the patient has a history of autism (undiagnosed), lives on his own but brother is caregiver and seems very involved. Family is concerned that he has been having difficulty taking care of himself lately and they are concerned about disposition at discharge.  b. SW consult  5. Elevated Creatinine, improved a. Last creatinine 0.9 in 2018, was 1.36 on presentation. Currently 1.03 b. Encourage PO intake  6. Hypertension, stable a. Continue home amlodipine  7. Hyperlipidemia a. Continue home Lipitor  8. Schizophrenia, stable a. Flat affect b. Continue home meds, monitor for sedation  9. Obesity a. Body mass index is 28.75 kg/m.    DVT prophylaxis: lovenox   Code Status: Full Code  Diet: heart healthy, carb controlled Family Communication: Admission, patients condition and plan of care including tests being ordered have been discussed with the patient who indicates understanding and agrees with the plan and Code Status. Patient's  brother was updated  Disposition Plan: The appropriate patient status for this patient is INPATIENT. Inpatient status is judged to be reasonable and necessary in order to provide the required intensity of service to ensure the patient's safety. The patient's presenting symptoms, physical exam findings, and initial radiographic and laboratory data in the context of their chronic comorbidities is felt to place them at high risk for further clinical deterioration. Furthermore, it  is not anticipated that the patient will be medically stable for discharge from the hospital within 2 midnights of admission. The following factors support the patient status of inpatient.   " The patient's presenting symptoms include generalized weakness. " The worrisome physical exam findings include hypoxia on ambulation. " The initial radiographic and laboratory data are worrisome because of COVID +. " The chronic co-morbidities include possible seizures, schizophrenia, obesity.   * I certify that at the point of admission it is my clinical judgment that the patient will require inpatient hospital care spanning beyond 2 midnights from the point of admission due to high intensity of service, high risk for further deterioration and high frequency of surveillance required.*   Status is: Inpatient  Remains inpatient appropriate because:Unsafe d/c plan and Inpatient level of care appropriate due to severity of illness   Dispo: The patient is from: Home              Anticipated d/c is to: TBD              Anticipated d/c date is: 2 days              Patient currently is not medically stable to d/c.    Consultants  none Procedures  . none  Time Spent on Admission: 70 minutes    Jae Dire, DO [facility] Pager [phone] 06/08/2020, 12:54 PM

## 2020-06-08 NOTE — Progress Notes (Signed)
EEG Completed; Results Pending  

## 2020-06-08 NOTE — ED Notes (Signed)
Pulse ox 89% rm air while ambulating, states feels unsteady, HR 110.  Recovered to baseline once returned to bed.

## 2020-06-08 NOTE — ED Provider Notes (Signed)
9:04 AM patient tells me that the primary reason he is here is for Covid and that he fell but could not get up.  He feels like the weakness is somewhat better.  He lives by himself.  He is not altered on my exam.  Vitals are reassuring.  We will test his ambulation and pulse ox and if he feels like he can ambulate at home I think he can probably be discharged.  9:24 AM Patient was a little wobbly when walking and desatted down to 89%.  While he is not critically ill, he does probably still need to be admitted and get further Covid treatment and supportive care.   Pricilla Loveless, MD 06/08/20 [phone]

## 2020-06-08 NOTE — Procedures (Signed)
Patient Name: Jaime Lopez  MRN: [id number]  Epilepsy Attending: Charlsie Quest  Referring Physician/Provider: Dr Whitney Post Date: 06/08/2020 Duration: 22.53 mins  Patient history: 10to M with ams. Reportedly had been found down with increased urinary incontinence and blank stares and has a remote history of seizures 10+ years ago not on AEDs. EEG to evaluate for seizure,  Level of alertness: Awake  AEDs during EEG study: Gabapentin  Technical aspects: This EEG study was done with scalp electrodes positioned according to the 10-20 International system of electrode placement. Electrical activity was acquired at a sampling rate of 500Hz  and reviewed with a high frequency filter of 70Hz  and a low frequency filter of 1Hz . EEG data were recorded continuously and digitally stored.   Description: The posterior dominant rhythm consists of 9 Hz activity of moderate voltage (25-35 uV) seen predominantly in posterior head regions, symmetric and reactive to eye opening and eye closing. Physiologic photic driving was not seen during photic stimulation.  Hyperventilation was not performed.     IMPRESSION: This study is within normal limits. No seizures or epileptiform discharges were seen throughout the recording.  Jaime Lopez 

## 2020-06-09 LAB — CBC WITH DIFFERENTIAL/PLATELET
Abs Immature Granulocytes: 0.09 10*3/uL — ABNORMAL HIGH (ref 0.00–0.07)
Basophils Absolute: 0 10*3/uL (ref 0.0–0.1)
Basophils Relative: 0 %
Eosinophils Absolute: 0 10*3/uL (ref 0.0–0.5)
Eosinophils Relative: 0 %
HCT: 37.2 % — ABNORMAL LOW (ref 39.0–52.0)
Hemoglobin: 12.2 g/dL — ABNORMAL LOW (ref 13.0–17.0)
Immature Granulocytes: 1 %
Lymphocytes Relative: 8 %
Lymphs Abs: 1.2 10*3/uL (ref 0.7–4.0)
MCH: 28.8 pg (ref 26.0–34.0)
MCHC: 32.8 g/dL (ref 30.0–36.0)
MCV: 87.7 fL (ref 80.0–100.0)
Monocytes Absolute: 0.9 10*3/uL (ref 0.1–1.0)
Monocytes Relative: 6 %
Neutro Abs: 12.9 10*3/uL — ABNORMAL HIGH (ref 1.7–7.7)
Neutrophils Relative %: 85 %
Platelets: 353 10*3/uL (ref 150–400)
RBC: 4.24 MIL/uL (ref 4.22–5.81)
RDW: 13.6 % (ref 11.5–15.5)
WBC: 15.2 10*3/uL — ABNORMAL HIGH (ref 4.0–10.5)
nRBC: 0 % (ref 0.0–0.2)

## 2020-06-09 LAB — COMPREHENSIVE METABOLIC PANEL
ALT: 24 U/L (ref 0–44)
AST: 29 U/L (ref 15–41)
Albumin: 3.3 g/dL — ABNORMAL LOW (ref 3.5–5.0)
Alkaline Phosphatase: 48 U/L (ref 38–126)
Anion gap: 10 (ref 5–15)
BUN: 25 mg/dL — ABNORMAL HIGH (ref 8–23)
CO2: 23 mmol/L (ref 22–32)
Calcium: 8.8 mg/dL — ABNORMAL LOW (ref 8.9–10.3)
Chloride: 108 mmol/L (ref 98–111)
Creatinine, Ser: 1.12 mg/dL (ref 0.61–1.24)
GFR calc Af Amer: >60 mL/min (ref >60)
GFR calc non Af Amer: >60 mL/min (ref >60)
Glucose, Bld: 117 mg/dL — ABNORMAL HIGH (ref 70–99)
Potassium: 3.5 mmol/L (ref 3.5–5.1)
Sodium: 141 mmol/L (ref 135–145)
Total Bilirubin: 0.4 mg/dL (ref 0.3–1.2)
Total Protein: 6.6 g/dL (ref 6.5–8.1)

## 2020-06-09 LAB — GLUCOSE, CAPILLARY
Glucose-Capillary: 129 mg/dL — ABNORMAL HIGH (ref 70–99)
Glucose-Capillary: 196 mg/dL — ABNORMAL HIGH (ref 70–99)
Glucose-Capillary: 149 mg/dL — ABNORMAL HIGH (ref 70–99)
Glucose-Capillary: 94 mg/dL (ref 70–99)

## 2020-06-09 LAB — FERRITIN: Ferritin: 105 ng/mL (ref 24–336)

## 2020-06-09 LAB — HIV ANTIBODY (ROUTINE TESTING W REFLEX): HIV Screen 4th Generation wRfx: NONREACTIVE

## 2020-06-09 LAB — D-DIMER, QUANTITATIVE: D-Dimer, Quant: 1.87 ug/mL-FEU — ABNORMAL HIGH (ref 0.00–0.50)

## 2020-06-09 LAB — HEMOGLOBIN A1C
Hgb A1c MFr Bld: 6.5 % — ABNORMAL HIGH (ref 4.8–5.6)
Mean Plasma Glucose: 139.85 mg/dL

## 2020-06-09 LAB — C-REACTIVE PROTEIN: CRP: 2.9 mg/dL — ABNORMAL HIGH (ref <1.0)

## 2020-06-09 LAB — MAGNESIUM: Magnesium: 1.7 mg/dL (ref 1.7–2.4)

## 2020-06-09 MED ORDER — ZINC SULFATE 220 (50 ZN) MG PO CAPS
220.0000 mg | ORAL_CAPSULE | Freq: Every day | ORAL | Status: DC
  Administered 2020-06-09 – 2020-06-14 (×6): 220 mg via ORAL
  Filled 2020-06-09 (×7): qty 1

## 2020-06-09 MED ORDER — INSULIN DETEMIR 100 UNIT/ML ~~LOC~~ SOLN
0.1500 [IU]/kg | Freq: Every day | SUBCUTANEOUS | Status: DC
  Administered 2020-06-10 – 2020-06-14 (×5): 14 [IU] via SUBCUTANEOUS
  Filled 2020-06-09 (×5): qty 0.14

## 2020-06-09 MED ORDER — ASCORBIC ACID 500 MG PO TABS
500.0000 mg | ORAL_TABLET | Freq: Every day | ORAL | Status: DC
  Administered 2020-06-09 – 2020-06-14 (×6): 500 mg via ORAL
  Filled 2020-06-09 (×6): qty 1

## 2020-06-09 NOTE — NC FL2 (Signed)
[place] MEDICAID FL2 LEVEL OF CARE SCREENING TOOL     IDENTIFICATION  Patient Name: Jaime Lopez Birthdate: 05-17-1951 Sex: male Admission Date (Current Location): 06/06/2020  [facility] and [place] Number:  Producer, television/film/video and Address:  [facility],  [address] [place], [place] [address]      Provider Number: [id number]  Attending Physician Name and Address:  [place], Eric J, DO  Relative Name and Phone Number:  Rudra, Hobbins (Brother) [phone]    Current Level of Care: Hospital Recommended Level of Care: Skilled Nursing Facility Prior Approval Number:    Date Approved/Denied:   PASRR Number:    Discharge Plan: SNF    Current Diagnoses: Patient Active Problem List   Diagnosis Date Noted  . SIRS (systemic inflammatory response syndrome) (HCC) 06/08/2020  . COVID-19 virus infection 06/08/2020  . Generalized weakness 06/08/2020  . Acute encephalopathy 06/07/2020  . Sepsis (HCC) 11/16/2016  . Type 2 diabetes mellitus without complication (HCC) 11/16/2016  . Hyperlipemia 11/16/2016  . Hypertension 11/16/2016  . Obesity, Class II, BMI 35-39.9, with comorbidity 11/16/2016  . Schizophrenia (HCC) 11/16/2016  . Syncope 11/16/2016  . Diarrhea 11/16/2016  . Chronic bilateral low back pain without sciatica 02/21/2016    Orientation RESPIRATION BLADDER Height & Weight     Self, Situation, Place, Time  Normal External catheter Weight: 90.9 kg Height:  5\' 10"  (177.8 cm)  BEHAVIORAL SYMPTOMS/MOOD NEUROLOGICAL BOWEL NUTRITION STATUS   (none)  (none) Continent Diet (see d/c summary)  AMBULATORY STATUS COMMUNICATION OF NEEDS Skin   Extensive Assist Verbally Normal                       Personal Care Assistance Level of Assistance  Bathing, Feeding, Dressing Bathing Assistance: Maximum assistance Feeding assistance: Independent Dressing Assistance: Limited assistance     Functional Limitations Info  Sight, Hearing, Speech Sight Info:  Adequate Hearing Info: Adequate Speech Info: Adequate    SPECIAL CARE FACTORS FREQUENCY  PT (By licensed PT), OT (By licensed OT)     PT Frequency: 5X/W OT Frequency: 5X/W            Contractures Contractures Info: Not present    Additional Factors Info  Code Status, Allergies, Psychotropic Code Status Info: Full Allergies Info: NKA Psychotropic Info: Prolixin  Patient is assymptomatic         Current Medications (06/09/2020):  This is the current hospital active medication list Current Facility-Administered Medications  Medication Dose Route Frequency Provider Last Rate Last Admin  . acetaminophen (TYLENOL) tablet 650 mg  650 mg Oral Q6H PRN 08/09/2020, MD   650 mg at 06/09/20 1337  . albuterol (VENTOLIN HFA) 108 (90 Base) MCG/ACT inhaler 2 puff  2 puff Inhalation Q6H PRN 08/09/20, MD      . amLODipine (NORVASC) tablet 10 mg  10 mg Oral Q supper Jae Dire, MD   10 mg at 06/08/20 1721  . ascorbic acid (VITAMIN C) tablet 500 mg  500 mg Oral Daily 08/08/20, [place], DO   500 mg at 06/09/20 08/09/20  . atorvastatin (LIPITOR) tablet 20 mg  20 mg Oral Q supper 5621, MD   20 mg at 06/08/20 1721  . benztropine (COGENTIN) tablet 0.5 mg  0.5 mg Oral 08/08/20, MD   0.5 mg at 06/08/20 1721  . enoxaparin (LOVENOX) injection 40 mg  40 mg Subcutaneous Q24H 08/08/20, MD   40 mg at 06/09/20  1337  . fluPHENAZine (PROLIXIN) tablet 5 mg  5 mg Oral Q supper Jae Dire, MD   5 mg at 06/08/20 1721  . gabapentin (NEURONTIN) capsule 300 mg  300 mg Oral Q supper Jae Dire, MD   300 mg at 06/08/20 1721  . insulin aspart (novoLOG) injection 0-20 Units  0-20 Units Subcutaneous TID WC Jae Dire, MD   4 Units at 06/09/20 1210  . [START ON 06/10/2020] insulin detemir (LEVEMIR) injection 14 Units  0.15 Units/kg Subcutaneous Daily [place], Eric J, DO      . linagliptin (TRADJENTA) tablet 5 mg  5 mg Oral Q supper Jae Dire, MD   5 mg at 06/08/20 1721  .  lisinopril (ZESTRIL) tablet 40 mg  40 mg Oral Q supper Jae Dire, MD   40 mg at 06/08/20 1721  . methylPREDNISolone sodium succinate (SOLU-MEDROL) 40 mg/mL injection 22.8 mg  0.25 mg/kg Intravenous Q12H Jae Dire, MD   22.8 mg at 06/09/20 0540  . polyethylene glycol (MIRALAX / GLYCOLAX) packet 17 g  17 g Oral Daily PRN Jae Dire, MD      . remdesivir 100 mg in sodium chloride 0.9 % 100 mL IVPB  100 mg Intravenous Daily Jae Dire, MD 200 mL/hr at 06/09/20 0946 100 mg at 06/09/20 0946  . tamsulosin (FLOMAX) capsule 0.4 mg  0.4 mg Oral QPC supper Jae Dire, MD   0.4 mg at 06/08/20 1722  . traZODone (DESYREL) tablet 50 mg  50 mg Oral Q supper Jae Dire, MD   50 mg at 06/08/20 1721  . zinc sulfate capsule 220 mg  220 mg Oral Daily [place], Eric J, DO   220 mg at 06/09/20 9323     Discharge Medications: Please see discharge summary for a list of discharge medications.  Relevant Imaging Results:  Relevant Lab Results:   Additional Information [phone] [place], [place]

## 2020-06-09 NOTE — Evaluation (Signed)
Physical Therapy Evaluation Patient Details Name: Jaime Lopez MRN: [id number] DOB: [birth year] Today's Date: 06/09/2020   History of Present Illness  Jaime Lopez is a 69 year old male with a history of schizophrenia, diabetes, hypertension, hyperlipidemia, remote seizures 10 years ago not on AEDs who has been vaccinated against COVID 19 who was noted to have an increase in urinary incontinence, blank stares and generalized weakness x 1 week by his brother who also apparently found the patient on the floor of his home on 8/31.   Clinical Impression  Jaime Lopez is 69 y.o. male admitted with above HPI and diagnosis. Patient is currently limited by functional impairments below (see PT problem list). Patient lives alone and is independent with mobility but has some assist for ADL's/IADL's from brother (supervision to shower, transportation, and grocery shopping) at baseline. Patient currently requires mod assist to complete bed mobility and min-mod assist for tranfers and gait with RW. Patient saturating at 95-99% on RA with mobility and was educated on safe use of walker and exercises for LE strengthening. Pt is limited by weakness and is not be safe to return to second floor apartment as he has 2 flights of stairs to ascend. Patient will benefit from continued skilled PT interventions to address impairments and progress independence with mobility, recommending SNF follow up with 24/7 assist. Acute PT will follow and progress as able.     Follow Up Recommendations SNF;Supervision/Assistance - 24 hour    Equipment Recommendations  Rolling walker with 5" wheels (TBA)    Recommendations for Other Services       Precautions / Restrictions Precautions Precautions: Fall Restrictions Weight Bearing Restrictions: No      Mobility  Bed Mobility Overal bed mobility: Needs Assistance Bed Mobility: Supine to Sit;Sit to Supine     Supine to sit: Mod assist;HOB elevated Sit to supine: Min  assist   General bed mobility comments: cues for sequencing roll and bringing LE's off EOB. Mod assist to bring LE's full off and to raise trunk up to sit. Pt required min assist to raise LE's back into bed.  Transfers Overall transfer level: Needs assistance Equipment used: Rolling walker (2 wheeled) Transfers: Sit to/from Stand;Lateral/Scoot Transfers Sit to Stand: Min assist;From elevated surface Stand pivot transfers: Min assist      Lateral/Scoot Transfers: Min guard General transfer comment: Cues for safe use of RW. Pt unable to initiate stand from low bed height. Min assist for power up from elevated height. pt able to scoot at EOB to move towars [facility] prior to laying supine.  Ambulation/Gait Ambulation/Gait assistance: Min assist Gait Distance (Feet): 65 Feet Assistive device: Rolling walker (2 wheeled) Gait Pattern/deviations: Step-through pattern;Decreased step length - right;Decreased step length - left;Decreased stride length;Trunk flexed Gait velocity: decr   General Gait Details: cues for safe proximity to RW, pt with mildly unsteady gait with walker and assist required to steady and manage walker with turns. pt ambulated in room doing several laps.   Stairs            Wheelchair Mobility    Modified Rankin (Stroke Patients Only)       Balance Overall balance assessment: Needs assistance Sitting-balance support: No upper extremity supported;Feet supported Sitting balance-Leahy Scale: Fair   Postural control: Posterior lean Standing balance support: Bilateral upper extremity supported;During functional activity Standing balance-Leahy Scale: Poor Standing balance comment: requires external support  Pertinent Vitals/Pain Pain Assessment: No/denies pain    Home Living Family/patient expects to be discharged to:: Private residence Living Arrangements: Alone Available Help at Discharge: Family;Available  PRN/intermittently Type of Home: Apartment Home Access: Stairs to enter Entrance Stairs-Rails: Right;Left Entrance Stairs-Number of Steps: 2nd floor Home Layout: One level Home Equipment: Walker - 2 wheels;Shower seat Additional Comments: pt reports his brother assists him with some ADL's    Prior Function Level of Independence: Needs assistance   Gait / Transfers Assistance Needed: doesn't use device;  pt's brother was helping him shower because he has fallen in shower previously.   ADL's / Homemaking Assistance Needed: independent with ADLs, brother provides with meals, grocery shops and cleans.  Comments: Sedentary lifestyle. Doesn't drive. enjoys watching MTV. favorite show is Rediculous.     Hand Dominance   Dominant Hand: Right    Extremity/Trunk Assessment   Upper Extremity Assessment Upper Extremity Assessment: Defer to OT evaluation    Lower Extremity Assessment Lower Extremity Assessment: Overall WFL for tasks assessed    Cervical / Trunk Assessment Cervical / Trunk Assessment: Normal  Communication   Communication: No difficulties  Cognition Arousal/Alertness: Awake/alert Behavior During Therapy: Flat affect Overall Cognitive Status: Within Functional Limits for tasks assessed                                        General Comments      Exercises Other Exercises Other Exercises: 2x 10 reps sit<>stand from EOB. Lowered height of bed on second set.    Assessment/Plan    PT Assessment Patient needs continued PT services  PT Problem List Decreased strength;Decreased activity tolerance;Decreased balance;Decreased mobility;Decreased knowledge of use of DME;Decreased cognition;Decreased safety awareness;Cardiopulmonary status limiting activity       PT Treatment Interventions DME instruction;Gait training;Stair training;Functional mobility training;Therapeutic activities;Therapeutic exercise;Balance training;Patient/family education    PT  Goals (Current goals can be found in the Care Plan section)  Acute Rehab PT Goals Patient Stated Goal: none stated, pt open to rehab PT Goal Formulation: With patient Time For Goal Achievement: 06/23/20 Potential to Achieve Goals: Good    Frequency Min 3X/week   Barriers to discharge        Co-evaluation               AM-PAC PT "6 Clicks" Mobility  Outcome Measure Help needed turning from your back to your side while in a flat bed without using bedrails?: A Little Help needed moving from lying on your back to sitting on the side of a flat bed without using bedrails?: A Lot Help needed moving to and from a bed to a chair (including a wheelchair)?: A Lot Help needed standing up from a chair using your arms (e.g., wheelchair or bedside chair)?: A Little Help needed to walk in hospital room?: A Little Help needed climbing 3-5 steps with a railing? : A Lot 6 Click Score: 15    End of Session Equipment Utilized During Treatment: Gait belt Activity Tolerance: Patient tolerated treatment well Patient left: in bed;with call bell/phone within reach;with bed alarm set (chair position) Nurse Communication: Mobility status PT Visit Diagnosis: Unsteadiness on feet (R26.81);Muscle weakness (generalized) (M62.81);Difficulty in walking, not elsewhere classified (R26.2)    Time: 0923-3007 PT Time Calculation (min) (ACUTE ONLY): 38 min   Charges:   PT Evaluation $PT Eval Moderate Complexity: 1 Mod PT Treatments $Gait Training: 8-22 mins $Therapeutic Exercise:  8-22 mins       Wynn Maudlin, DPT Acute Rehabilitation Services  Office [phone] Pager [phone]  06/09/2020 6:44 PM

## 2020-06-09 NOTE — TOC Initial Note (Signed)
Transition of Care [facility]) - Initial/Assessment Note    Patient Details  Name: Jaime Lopez MRN: [id number] Date of Birth: 28-Dec-1950  Transition of Care [facility]) CM/SW Contact:    Ida Rogue, LCSW Phone Number: 06/09/2020, 1:58 PM  Clinical Narrative:  Patient seen in follow up to OT recommendation of SNF/24 hour supervision.  Mr Clanton is diagnosed with schizophrenia, takes Prolixin that is prescribed by MD at [facility] in [facility], and lives by himself.  I called into the room to speak with him after he had seen OT.  He was sitting in the bedside chair eating lunch.  He expressed concerns that he is not as strong as he thought he was, and is "wobbly."  Mr Clanton is open to going to short term rehab.  I have initiated both PASSR and bed search.  Spoke with brother, who is relieved to hear of the plan as he states patient's apartment is on the second story, and stairs are an issue.  He has been trying to convince his brother to move to a first floor apartment, and is hopeful this is the impetus that will help bring that move to fruition. TOC will continue to follow during the course of hospitalization.                  Expected Discharge Plan: Skilled Nursing Facility Barriers to Discharge: SNF Pending bed offer   Patient Goals and CMS Choice Patient states their goals for this hospitalization and ongoing recovery are:: "I don't think I am strong enough to go home."   Choice offered to / list presented to : Sibling, Patient  Expected Discharge Plan and Services Expected Discharge Plan: Skilled Nursing Facility   Discharge Planning Services: CM Consult Post Acute Care Choice: Skilled Nursing Facility Living arrangements for the past 2 months: Apartment                                      Prior Living Arrangements/Services Living arrangements for the past 2 months: Apartment Lives with:: Self Patient language and need for interpreter reviewed:: Yes        Need for Family  Participation in Patient Care: Yes (Comment) Care giver support system in place?: Yes (comment)   Criminal Activity/Legal Involvement Pertinent to Current Situation/Hospitalization: No - Comment as needed  Activities of Daily Living Home Assistive Devices/Equipment: CBG Meter, Eyeglasses ADL Screening (condition at time of admission) Patient's cognitive ability adequate to safely complete daily activities?: No Is the patient deaf or have difficulty hearing?: No Does the patient have difficulty seeing, even when wearing glasses/contacts?: No Does the patient have difficulty concentrating, remembering, or making decisions?: Yes Patient able to express need for assistance with ADLs?: Yes Does the patient have difficulty dressing or bathing?: Yes Independently performs ADLs?: No Communication: Independent Dressing (OT): Needs assistance Is this a change from baseline?: Change from baseline, expected to last >3 days Grooming: Needs assistance Is this a change from baseline?: Change from baseline, expected to last >3 days Feeding: Needs assistance Is this a change from baseline?: Change from baseline, expected to last >3 days Bathing: Needs assistance Is this a change from baseline?: Change from baseline, expected to last >3 days Toileting: Needs assistance Is this a change from baseline?: Change from baseline, expected to last >3days In/Out Bed: Needs assistance Is this a change from baseline?: Change from baseline, expected to last >3 days  Walks in Home: Needs assistance Is this a change from baseline?: Change from baseline, expected to last >3 days Does the patient have difficulty walking or climbing stairs?: Yes (secondary to weakness) Weakness of Legs: Both Weakness of Arms/Hands: Both  Permission Sought/Granted Permission sought to share information with : Family Supports    Share Information with NAME: Tru, Rana (Brother) [phone]           Emotional  Assessment Appearance:: Appears stated age Attitude/Demeanor/Rapport: Engaged Affect (typically observed): Flat Orientation: : Oriented to Self, Oriented to Place, Oriented to  Time, Oriented to Situation Alcohol / Substance Use: Not Applicable Psych Involvement: No (comment)  Admission diagnosis:  Acute encephalopathy [G93.40] Altered mental status, unspecified altered mental status type [R41.82] COVID-19 [U07.1] Patient Active Problem List   Diagnosis Date Noted  . SIRS (systemic inflammatory response syndrome) (HCC) 06/08/2020  . COVID-19 virus infection 06/08/2020  . Generalized weakness 06/08/2020  . Acute encephalopathy 06/07/2020  . Sepsis (HCC) 11/16/2016  . Type 2 diabetes mellitus without complication (HCC) 11/16/2016  . Hyperlipemia 11/16/2016  . Hypertension 11/16/2016  . Obesity, Class II, BMI 35-39.9, with comorbidity 11/16/2016  . Schizophrenia (HCC) 11/16/2016  . Syncope 11/16/2016  . Diarrhea 11/16/2016  . Chronic bilateral low back pain without sciatica 02/21/2016   PCP:  Jolene Provost, MD Pharmacy:   [facility] [address] [place], [place] - [address] [place] [address] Phone: [phone] Fax: [phone]     Social Determinants of Health (SDOH) Interventions    Readmission Risk Interventions No flowsheet data found.

## 2020-06-09 NOTE — Progress Notes (Signed)
PROGRESS NOTE    Jaime EdisDouglas Theissen  ZOX:[id number] DOB: 12/18/1950 DOA: 06/06/2020 PCP: Jolene ProvostHaimes, David M, MD    Brief Narrative:  Jaime Lopez is a 69 year old male with a history of schizophrenia, diabetes, hypertension, hyperlipidemia, remote seizures 10 years ago not on AEDs who has been vaccinated against COVID 19 who was noted to have an increase in urinary incontinence, blank stares and generalized weakness x 1 week by his brother who also apparently found the patient on the floor of his home on 8/31, prompting presentation to [facility]. The patient was diagnosed with COVID 19.   Currently, the patient states he recently traveled out of town and soon after began feeling ill. He has had a dry cough and generalized weakness most notable in his upper extremities. Denies chest pain, palpitations, shortness of breath, nausea, vomiting, leg swelling or any other discomfort.   In the ED, patient was initially hemodynamically stable on room air. CXR negative, CT head without acute changes but noted chronic small vessel ischemic changes. Initial labs notable for LDH 240, CRP 5.7, Lactic acid 1.0, WBC 11.4->11.6, D Dimer 2.79 -> 3.14, fibrinogen 656. He was given dexamethasone and remdesivir. Plan was to [facility], however patient had an unsteady gait and desatted to 89%, [facility] and further treatment for COVID.   Assessment & Plan:   Principal Problem:   Generalized weakness Active Problems:   Type 2 diabetes mellitus without complication (HCC)   Hyperlipemia   Hypertension   Schizophrenia (HCC)   Acute encephalopathy   SIRS (systemic inflammatory response syndrome) (HCC)   COVID-19 virus infection   Acute Covid-19 viral infection during the ongoing 2020 Covid 19 Pandemic - POA Patient brought [facility] med [facility] for staring spells, progressive weakness.  Has reportedly been vaccinated against Covid-19. --COVID test: PCR + 06/06/2020 --CRP  5.7>4.3>2.9 --ddimer 2.78>3.14>1.87 --Remdesivir, plan 5-day course (Day #3/5) --Continue Solumedrol 22.8mg  IV q12h --prone for 2-3hrs every 12hrs if able --Continue supplemental oxygen, titrate to maintain SPO2 greater than 92%; currently on room air with SPO2 91%. --Check O2 desaturation screen with ambulation --Continue supportive care with albuterol MDI prn, vitamin C, zinc, Tylenol, antitussives (benzonatate/ Mucinex/Tussionex) --Follow CBC, CMP, D-dimer, ferritin, and CRP daily --Continue airborne/contact isolation precautions for 3 weeks from the day of diagnosis  The treatment plan and use of medications and known side effects were discussed with patient/family. Some of the medications used are based on case reports/anecdotal data.  All other medications being used in the management of COVID-19 based on limited study data.  Complete risks and long-term side effects are unknown, however in the best clinical judgment they seem to be of some benefit.  Patient wanted to proceed with treatment options provided.  Acute encephalopathy, unclear etiology: Resolved Patient brother reports that patient has been experiencing "staring spells" with his "mouth open".  Initially considered absence seizure in the differential.  CT head without contrast with no acute intracranial findings.  EEG with no seizure-like/epileptiform activity.  It appears that his confusion/encephalopathy is now resolved and his mentation is at baseline.  Type 2 diabetes mellitus On Janumet 50-1000 mg p.o. twice daily at home.  Hemoglobin A1c 6.5, well controlled. --Hold home diabetic regimen while inpatient --Levemir 14 subcutaneously daily --Tradjenta 5 mg p.o. daily --Insulin sliding scale for coverage --CBGs before every meal/at bedtime  Elevated creatinine 1.36 on presentation, suspect poor oral intake/dehydration.  Last creatinine 0.9 in 2018. --Cr 1.36>1.12 --Avoid nephrotoxins, renally dose  all  medications --Follow BMP daily  Schizophrenia Follows with Dr. Amanda Cockayne, outpatient --Continue trazodone 50 mg p.o. nightly --Cogentin 0.5 mg every other day --Fluphenazine 5 mg p.o. daily  HLD: Atorvastatin 20 mg p.o. daily  Essential hypertension BP 142/82 this morning. --Continue amlodipine 10 mg p.o. daily --Continue lisinopril 40 mg p.o. daily --Continue monitor blood pressure closely  Obesity Body mass index is 28.75 kg/m.  Counseled on lifestyle changes/weight loss.  Fall/gait disturbance: --PT/OT evaluation: Pending   DVT prophylaxis: Lovenox Code Status: Full code Family Communication: Updated patient's brother via telephone this morning  Disposition Plan:  Status is: Inpatient  Remains inpatient appropriate because:Unsafe d/c plan and IV treatments appropriate due to intensity of illness or inability to take PO likely will need SNF placement due to his gait disturbance and history of schizophrenia unable to take care for himself in which she lives alone at home.  Likely will need to complete remdesivir inpatient which will be on 06/11/2020 then will be ready for discharge.   Dispo: The patient is from: Home              Anticipated d/c is to: SNF              Anticipated d/c date is: 2 days              Patient currently is medically stable to d/c.   Consultants:   None  Procedures:   EEG  Antimicrobials:   Remdesivir 8/31>>   Subjective: Patient seen and examined bedside, resting comfortably.  Is alert and oriented.  States wants to discharge home.  Discussed with patient's brother who is his current caretaker as patient currently resides alone.  States he will "not have any contact with his brother until he clears quarantine".  He also states patient is unsafe to be alone given his mental state with schizophrenia and he has very poor balance with recent multiple falls.  He is anticipating call from social work to discuss discharge options.  Patient  without any other complaints or concerns at this time.  Denies headache, no fever/chills/night sweats, no chest pain, palpitations, no shortness of breath, no abdominal pain.  No acute events overnight per nursing staff.  Objective: Vitals:   06/08/20 1821 06/08/20 2206 06/09/20 0408 06/09/20 1324  BP: (!) 142/83 130/86 (!) 142/82 135/78  Pulse: 88 80 79 91  Resp: Temp: 98.6 F (37 C) 98 F (36.7 C) 97.9 F (36.6 C) 98.4 F (36.9 C)  TempSrc: Oral Oral    SpO2: 95% 95% 91% 96%  Weight:      Height:        Intake/Output Summary (Last 24 hours) at 06/09/2020 1338 Last data filed at 06/09/2020 1306 Gross per 24 hour  Intake 1435 ml  Output 600 ml  Net 835 ml   Filed Weights   06/08/20 1313  Weight: 90.9 kg    Examination:  General exam: Appears calm and comfortable  Respiratory system: Clear to auscultation. Respiratory effort normal.  Oxygen well on room air Cardiovascular system: S1 & S2 heard, RRR. No JVD, murmurs, rubs, gallops or clicks. No pedal edema. Gastrointestinal system: Abdomen is nondistended, soft and nontender. No organomegaly or masses felt. Normal bowel sounds heard. Central nervous system: Alert and oriented to person/place/time. No focal neurological deficits. Extremities: Symmetric 5 x 5 power. Skin: No rashes, lesions or ulcers Psychiatry: Judgement and insight appear poor.  Normal mood, flat affect    Data  Reviewed: I have personally reviewed following labs and imaging studies  CBC: Recent Labs  Lab 06/06/20 2017 06/07/20 0235 06/08/20 0743 06/08/20 1242 06/09/20 0342  WBC 16.3* 11.4* 11.6* 13.7* 15.2*  NEUTROABS 13.7* 8.4* 8.9*  --  12.9*  HGB 12.5* 11.7* 12.5* 13.5 12.2*  HCT 37.8* 35.4* 37.5* 40.0 37.2*  MCV 87.7 86.6 86.4 86.8 87.7  PLT 369 335 350 374 353   Basic Metabolic Panel: Recent Labs  Lab 06/06/20 2017 06/07/20 0235 06/08/20 0743 06/08/20 1242 06/09/20 0342  NA 140 138 142  --  141  K 3.9 3.6 4.0  --  3.5   CL 105 105 107  --  108  CO2 22 22 26   --  23  GLUCOSE 128* 127* 124*  --  117*  BUN 26* 25* 22  --  25*  CREATININE 1.36* 1.12 1.03 0.99 1.12  CALCIUM 9.6 9.3 8.8*  --  8.8*  MG  --   --   --   --  1.7   GFR: Estimated Creatinine Clearance: 72.6 mL/min (by C-G formula based on SCr of 1.12 mg/dL). Liver Function Tests: Recent Labs  Lab 06/07/20 0235 06/08/20 0743 06/09/20 0342  AST 66* 39 29  ALT 31 27 24   ALKPHOS 47 45 48  BILITOT 0.6 0.3 0.4  PROT 7.3 6.9 6.6  ALBUMIN 3.7 3.3* 3.3*   No results for input(s): LIPASE, AMYLASE in the last 168 hours. No results for input(s): AMMONIA in the last 168 hours. Coagulation Profile: No results for input(s): INR, PROTIME in the last 168 hours. Cardiac Enzymes: No results for input(s): CKTOTAL, CKMB, CKMBINDEX, TROPONINI in the last 168 hours. BNP (last 3 results) No results for input(s): PROBNP in the last 8760 hours. HbA1C: Recent Labs    06/09/20 0342  HGBA1C 6.5*   CBG: Recent Labs  Lab 06/08/20 1223 06/08/20 1723 06/08/20 2207 06/09/20 0810 06/09/20 1142  GLUCAP 157* 300* 115* 129* 196*   Lipid Profile: Recent Labs    06/07/20 0235  TRIG 79   Thyroid Function Tests: No results for input(s): TSH, T4TOTAL, FREET4, T3FREE, THYROIDAB in the last 72 hours. Anemia Panel: Recent Labs    06/07/20 0235 06/09/20 0342  FERRITIN 131 105   Sepsis Labs: Recent Labs  Lab 06/07/20 0235  PROCALCITON 0.40  LATICACIDVEN 1.0    Recent Results (from the past 240 hour(s))  SARS Coronavirus 2 by RT PCR (hospital order, performed in [facility] hospital lab) Nasopharyngeal Nasopharyngeal Swab     Status: Abnormal   Collection Time: 06/06/20 11:42 PM   Specimen: Nasopharyngeal Swab  Result Value Ref Range Status   SARS Coronavirus 2 POSITIVE (A) NEGATIVE Final    Comment: RESULT CALLED TO, READ BACK BY AND VERIFIED WITH: LISA ADKINS RN @1242  06/07/2020 OLSONM (NOTE) SARS-CoV-2 target nucleic acids are  DETECTED  SARS-CoV-2 RNA is generally detectable in upper respiratory specimens  during the acute phase of infection.  Positive results are indicative  of the presence of the identified virus, but do not rule out bacterial infection or co-infection with other pathogens not detected by the test.  Clinical correlation with patient history and  other diagnostic information is necessary to determine patient infection status.  The expected result is negative.  Fact Sheet for Patients:   06/08/20   Fact Sheet for Healthcare Providers:      This test is not yet approved or cleared by the 08/07/2020 FDA and  has been authorized for detection and/or  diagnosis of SARS-CoV-2 by FDA under an Emergency Use Authorization (EUA).  This EUA will remain in effect (meaning thi s test can be used) for the duration of  the COVID-19 declaration under Section 564(b)(1) of the Act, 21 U.S.C. section 360-bbb-3(b)(1), unless the authorization is terminated or revoked sooner.  Performed at [facility], [address] [place], [place] [address]   Blood Culture (routine x 2)     Status: None (Preliminary result)   Collection Time: 06/07/20  2:35 AM   Specimen: BLOOD RIGHT ARM  Result Value Ref Range Status   Specimen Description   Final    BLOOD RIGHT ARM Performed at [facility], [address] [place], [place] [address]    Special Requests   Final    BOTTLES DRAWN AEROBIC AND ANAEROBIC Blood Culture adequate volume Performed at [facility], [address] [place], [place] [address]    Culture   Final    NO GROWTH 2 DAYS Performed at [facility], [address] [place], [place] [address]    Report Status PENDING  Incomplete  Blood Culture (routine x 2)     Status: None (Preliminary result)   Collection Time: 06/07/20  2:40 AM   Specimen: BLOOD RIGHT HAND  Result Value Ref  Range Status   Specimen Description   Final    BLOOD RIGHT HAND Performed at [facility], [address] [place], [place] [address]    Special Requests   Final    BOTTLES DRAWN AEROBIC AND ANAEROBIC Blood Culture adequate volume Performed at [facility], [address] [place], [place] [address]    Culture   Final    NO GROWTH 2 DAYS Performed at [facility], [address], [place], [place] [address]    Report Status PENDING  Incomplete      Radiology Studies: EEG  Result Date: 06/08/2020 Charlsie Quest, MD     06/08/2020  4:36 PM Patient Name: Ankith Edmonston MRN: [id number] Epilepsy Attending: Charlsie Quest Referring Physician/Provider: Dr Whitney Post Date: 06/08/2020 Duration: 22.53 mins Patient history: 28to M with ams. Reportedly had been found down with increased urinary incontinence and blank stares and has a remote history of seizures 10+ years ago not on AEDs. EEG to evaluate for seizure, Level of alertness: Awake AEDs during EEG study: Gabapentin Technical aspects: This EEG study was done with scalp electrodes positioned according to the 10-20 International system of electrode placement. Electrical activity was acquired at a sampling rate of 500Hz  and reviewed with a high frequency filter of 70Hz  and a low frequency filter of 1Hz . EEG data were recorded continuously and digitally stored. Description: The posterior dominant rhythm consists of 9 Hz activity of moderate voltage (25-35 uV) seen predominantly in posterior head regions, symmetric and reactive to eye opening and eye closing. Physiologic photic driving was not seen during photic stimulation.  Hyperventilation was not performed.   IMPRESSION: This study is within normal limits. No seizures or epileptiform discharges were seen throughout the recording. Priyanka      Scheduled Meds: . amLODipine  10 mg Oral Q supper  . vitamin C  500 mg Oral Daily  . atorvastatin  20 mg Oral Q supper   . benztropine  0.5 mg Oral QODAY  . enoxaparin (LOVENOX) injection  40 mg Subcutaneous Q24H  . fluPHENAZine  5 mg Oral Q supper  .  gabapentin  300 mg Oral Q supper  . insulin aspart  0-20 Units Subcutaneous TID WC  . insulin detemir  0.15 Units/kg Subcutaneous BID  . linagliptin  5 mg Oral Q supper  . lisinopril  40 mg Oral Q supper  . methylPREDNISolone (SOLU-MEDROL) injection  0.25 mg/kg Intravenous Q12H  . tamsulosin  0.4 mg Oral QPC supper  . traZODone  50 mg Oral Q supper  . zinc sulfate  220 mg Oral Daily   Continuous Infusions: . remdesivir 100 mg in NS 100 mL 100 mg (06/09/20 0946)     LOS: 1 day    Time spent: 42 minutes spent on chart review, discussion with nursing staff, consultants, updating family and interview/physical exam; more than 50% of that time was spent in counseling and/or coordination of care.    Alvira Philips [place], DO [facility] Available via Epic secure chat 7am-7pm After these hours, please refer to coverage provider listed on [email or web address] 06/09/2020, 1:38 PM

## 2020-06-09 NOTE — Evaluation (Signed)
Occupational Therapy Evaluation Patient Details Name: Jaime Lopez MRN: [id number] DOB: 1951/06/06 Today's Date: 06/09/2020    History of Present Illness Jaime Lopez is a 69 year old male with a history of schizophrenia, diabetes, hypertension, hyperlipidemia, remote seizures 10 years ago admitted to hospital with with urinary incontinence, generalized weakness and blank stares. EEG negative for acute findings. COVID +   Clinical Impression   Mr. Jaime Lopez is  69 year old man who presents on room air with o2 sat 95-95% in supine. On evaluation patient presents with generalized weakness, poor activity tolerance and impaired balance resulting in decreased ability to perform baseline mobility and ADLs. Per patient he is independent with ADLs and ambulated without a device. Reports being able to get to second floor but slowed down at the top of the steps. Today patient demonstrated functional strength with MMT but overall generalized weakness, impaired sitting and standing balance and poor activity tolerance. Patient required assistance with bathing and dressing task after urinary incontinence episode. Patient exhibited difficulty with lower body ADLs due to weakness and balance and poor quality with task. Patient exhibited posterior lean with sitting and standing making him a high fall risk. Patient will benefit from skilled OT services to improve deficits in order to improve functional abilities and mobility. Patient will need short term rehab at discharge and is unsafe to return home alone at this time. If patient refuses rehab he would require 24/7 assistance at home and [facility] OT.    Follow Up Recommendations  SNF    Equipment Recommendations  Other (comment) (defer to next venue)    Recommendations for Other Services       Precautions / Restrictions Precautions Precautions: Fall Restrictions Weight Bearing Restrictions: No      Mobility Bed Mobility Overal bed mobility: Needs  Assistance Bed Mobility: Supine to Sit     Supine to sit: Mod assist     General bed mobility comments: MOd assist for trunk lift off and guiding legs to side of bed. verbal cues to scoot foward  Transfers Overall transfer level: Needs assistance Equipment used: 2 person hand held assist Transfers: Sit to/from Stand Sit to Stand: Min assist Stand pivot transfers: Min assist       General transfer comment: Min assist for ambulation to steady patient. 2 hand holds to ambulate around bed.    Balance Overall balance assessment: Needs assistance Sitting-balance support: No upper extremity supported;Feet supported Sitting balance-Leahy Scale: Poor   Postural control: Posterior lean Standing balance support: Bilateral upper extremity supported;During functional activity Standing balance-Leahy Scale: Poor Standing balance comment: requires external support                           ADL either performed or assessed with clinical judgement   ADL Overall ADL's : Needs assistance/impaired Eating/Feeding: Set up;Sitting   Grooming: Oral care;Standing;Set up Grooming Details (indicate cue type and reason): brusheed teeth standing at sink. set up required - unable to open wrap on tooth brush. Min assist for steadying secondary to psoterior lean. One loss of balance. Upper Body Bathing: Sitting;Moderate assistance;Minimal assistance;Set up   Lower Body Bathing: Moderate assistance;Set up;Cueing for sequencing;Cueing for safety Lower Body Bathing Details (indicate cue type and reason): Patient needed assistance to wash up lower body. Poor quality when he did perofrm task. Leaning back on bed - required persistent verbal cues to lean forward and not fall backwards.     Lower Body Dressing: Set  up;Sit to/from stand   Toilet Transfer: Minimal assistance;BSC;Stand-pivot   Toileting- Clothing Manipulation and Hygiene: Maximal assistance;Sit to/from stand                Vision Patient Visual Report: No change from baseline       Perception     Praxis      Pertinent Vitals/Pain Pain Assessment: No/denies pain     Hand Dominance     Extremity/Trunk Assessment Upper Extremity Assessment Upper Extremity Assessment: Overall WFL for tasks assessed   Lower Extremity Assessment Lower Extremity Assessment: Defer to PT evaluation   Cervical / Trunk Assessment Cervical / Trunk Assessment: Normal   Communication     Cognition Arousal/Alertness: Awake/alert Behavior During Therapy: Flat affect Overall Cognitive Status: Within Functional Limits for tasks assessed                                     General Comments       Exercises     Shoulder Instructions      Home Living                                          Prior Functioning/Environment                   OT Problem List: Decreased strength;Decreased activity tolerance;Decreased knowledge of use of DME or AE;Decreased safety awareness      OT Treatment/Interventions: Self-care/ADL training;Therapeutic exercise;Energy conservation;DME and/or AE instruction;Therapeutic activities;Balance training;Patient/family education    OT Goals(Current goals can be found in the care plan section) Acute Rehab OT Goals Patient Stated Goal: To go home OT Goal Formulation: With patient Time For Goal Achievement: 06/23/20 Potential to Achieve Goals: Good  OT Frequency: Min 2X/week   Barriers to D/C: Inaccessible home environment;Decreased caregiver support          Co-evaluation              AM-PAC OT "6 Clicks" Daily Activity     Outcome Measure Help from another person eating meals?: A Little Help from another person taking care of personal grooming?: A Little Help from another person toileting, which includes using toliet, bedpan, or urinal?: A Lot Help from another person bathing (including washing, rinsing, drying)?: A Lot Help from  another person to put on and taking off regular upper body clothing?: A Little Help from another person to put on and taking off regular lower body clothing?: A Lot 6 Click Score: 15   End of Session Equipment Utilized During Treatment: Rolling walker Nurse Communication: Mobility status  Activity Tolerance: Patient tolerated treatment well Patient left: in chair;with call bell/phone within reach;with chair alarm set  OT Visit Diagnosis: Unsteadiness on feet (R26.81);Muscle weakness (generalized) (M62.81);History of falling (Z91.81)                Time: 0175-1025 OT Time Calculation (min): 33 min Charges:  OT General Charges $OT Visit: 1 Visit OT Evaluation $OT Eval Moderate Complexity: 1 Mod OT Treatments $Self Care/Home Management : 8-22 mins  Jeriel Vivanco, OTR/L Acute Care Rehab Services  Office [phone] Pager: [phone]   Kelli Churn 06/09/2020, 3:37 PM

## 2020-06-10 LAB — COMPREHENSIVE METABOLIC PANEL
ALT: 26 U/L (ref 0–44)
AST: 31 U/L (ref 15–41)
Albumin: 3.5 g/dL (ref 3.5–5.0)
Alkaline Phosphatase: 53 U/L (ref 38–126)
Anion gap: 12 (ref 5–15)
BUN: 30 mg/dL — ABNORMAL HIGH (ref 8–23)
CO2: 23 mmol/L (ref 22–32)
Calcium: 8.7 mg/dL — ABNORMAL LOW (ref 8.9–10.3)
Chloride: 102 mmol/L (ref 98–111)
Creatinine, Ser: 1.05 mg/dL (ref 0.61–1.24)
GFR calc Af Amer: >60 mL/min (ref >60)
GFR calc non Af Amer: >60 mL/min (ref >60)
Glucose, Bld: 105 mg/dL — ABNORMAL HIGH (ref 70–99)
Potassium: 3.7 mmol/L (ref 3.5–5.1)
Sodium: 137 mmol/L (ref 135–145)
Total Bilirubin: 0.6 mg/dL (ref 0.3–1.2)
Total Protein: 6.8 g/dL (ref 6.5–8.1)

## 2020-06-10 LAB — GLUCOSE, CAPILLARY
Glucose-Capillary: 102 mg/dL — ABNORMAL HIGH (ref 70–99)
Glucose-Capillary: 276 mg/dL — ABNORMAL HIGH (ref 70–99)
Glucose-Capillary: 209 mg/dL — ABNORMAL HIGH (ref 70–99)
Glucose-Capillary: 95 mg/dL (ref 70–99)

## 2020-06-10 LAB — CBC WITH DIFFERENTIAL/PLATELET
Abs Immature Granulocytes: 0.14 10*3/uL — ABNORMAL HIGH (ref 0.00–0.07)
Basophils Absolute: 0.1 10*3/uL (ref 0.0–0.1)
Basophils Relative: 0 %
Eosinophils Absolute: 0.2 10*3/uL (ref 0.0–0.5)
Eosinophils Relative: 1 %
HCT: 38.3 % — ABNORMAL LOW (ref 39.0–52.0)
Hemoglobin: 12.7 g/dL — ABNORMAL LOW (ref 13.0–17.0)
Immature Granulocytes: 1 %
Lymphocytes Relative: 13 %
Lymphs Abs: 2 10*3/uL (ref 0.7–4.0)
MCH: 28.9 pg (ref 26.0–34.0)
MCHC: 33.2 g/dL (ref 30.0–36.0)
MCV: 87.2 fL (ref 80.0–100.0)
Monocytes Absolute: 1.3 10*3/uL — ABNORMAL HIGH (ref 0.1–1.0)
Monocytes Relative: 8 %
Neutro Abs: 11.7 10*3/uL — ABNORMAL HIGH (ref 1.7–7.7)
Neutrophils Relative %: 77 %
Platelets: 369 10*3/uL (ref 150–400)
RBC: 4.39 MIL/uL (ref 4.22–5.81)
RDW: 13.4 % (ref 11.5–15.5)
WBC: 15.4 10*3/uL — ABNORMAL HIGH (ref 4.0–10.5)
nRBC: 0 % (ref 0.0–0.2)

## 2020-06-10 LAB — D-DIMER, QUANTITATIVE: D-Dimer, Quant: 2.64 ug/mL-FEU — ABNORMAL HIGH (ref 0.00–0.50)

## 2020-06-10 LAB — C-REACTIVE PROTEIN: CRP: 2.1 mg/dL — ABNORMAL HIGH (ref <1.0)

## 2020-06-10 LAB — FERRITIN: Ferritin: 101 ng/mL (ref 24–336)

## 2020-06-10 LAB — MAGNESIUM: Magnesium: 1.5 mg/dL — ABNORMAL LOW (ref 1.7–2.4)

## 2020-06-10 NOTE — Progress Notes (Addendum)
PROGRESS NOTE    Jaime Lopez  IPJ:[id number] DOB: 03/26/1951 DOA: 06/06/2020 PCP: Jolene Provost, MD    Brief Narrative:  Jaime Lopez is a 69 year old male with a history of schizophrenia, diabetes, hypertension, hyperlipidemia, remote seizures 10 years ago not on AEDs who has been vaccinated against COVID 19 who was noted to have an increase in urinary incontinence, blank stares and generalized weakness x 1 week by his brother who also apparently found the patient on the floor of his home on 8/31, prompting presentation to [facility]. The patient was diagnosed with COVID 19.   Currently, the patient states he recently traveled out of town and soon after began feeling ill. He has had a dry cough and generalized weakness most notable in his upper extremities. Denies chest pain, palpitations, shortness of breath, nausea, vomiting, leg swelling or any other discomfort.   In the ED, patient was initially hemodynamically stable on room air. CXR negative, CT head without acute changes but noted chronic small vessel ischemic changes. Initial labs notable for LDH 240, CRP 5.7, Lactic acid 1.0, WBC 11.4->11.6, D Dimer 2.79 -> 3.14, fibrinogen 656. He was given dexamethasone and remdesivir. Plan was to potentially discharge from [facility] ED today, however patient had an unsteady gait and desatted to 89%, prompting transfer to [facility] and further treatment for COVID.   Assessment & Plan:   Principal Problem:   Generalized weakness Active Problems:   Type 2 diabetes mellitus without complication (HCC)   Hyperlipemia   Hypertension   Schizophrenia (HCC)   Acute encephalopathy   SIRS (systemic inflammatory response syndrome) (HCC)   COVID-19 virus infection   Acute Covid-19 viral infection during the ongoing 2020 Covid 19 Pandemic - POA Patient brought to med [facility] for staring spells, progressive weakness.  Has reportedly been vaccinated against Covid-19. --COVID test: PCR + 06/06/2020 --CRP  5.7>4.3>2.9>2.1 --ddimer 2.78>3.14>1.87>2.64 --Remdesivir, plan 5-day course (Day #4/5) --Discontinue Solu-Medrol today is oxygenating well on room air with SPO2 100% --prone for 2-3hrs every 12hrs if able --Continue supplemental oxygen, titrate to maintain SPO2 greater than 92%; currently on room air with SPO2 100%. --Continue supportive care with albuterol MDI prn, vitamin C, zinc, Tylenol, antitussives (benzonatate/ Mucinex/Tussionex) --Follow CBC, CMP, D-dimer, ferritin, and CRP daily --Continue airborne/contact isolation precautions for 3 weeks from the day of diagnosis  The treatment plan and use of medications and known side effects were discussed with patient/family. Some of the medications used are based on case reports/anecdotal data.  All other medications being used in the management of COVID-19 based on limited study data.  Complete risks and long-term side effects are unknown, however in the best clinical judgment they seem to be of some benefit.  Patient wanted to proceed with treatment options provided.  Acute encephalopathy, unclear etiology: Resolved Patient brother reports that patient has been experiencing "staring spells" with his "mouth open".  Initially considered absence seizure in the differential.  CT head without contrast with no acute intracranial findings.  EEG with no seizure-like/epileptiform activity.  It appears that his confusion/encephalopathy is now resolved and his mentation is at baseline.  Type 2 diabetes mellitus On Janumet 50-1000 mg p.o. twice daily at home.  Hemoglobin A1c 6.5, well controlled. --Hold home diabetic regimen while inpatient --Levemir 14 subcutaneously daily --Tradjenta 5 mg p.o. daily --Insulin sliding scale for coverage --CBGs before every meal/at bedtime  Elevated creatinine 1.36 on presentation, suspect poor oral intake/dehydration.  Last creatinine 0.9 in 2018. --Cr 1.36>1.12>1.05 --Avoid nephrotoxins, renally  dose all  medications --Follow BMP daily  Schizophrenia Follows with Dr. Amanda CockayneAdigori, outpatient --Continue trazodone 50 mg p.o. nightly --Cogentin 0.5 mg every other day --Fluphenazine 5 mg p.o. daily  HLD: Atorvastatin 20 mg p.o. daily  Essential hypertension BP 138/88 this morning. --Continue amlodipine 10 mg p.o. daily --Continue lisinopril 40 mg p.o. daily --Continue monitor blood pressure closely  Obesity Body mass index is 28.75 kg/m.  Counseled on lifestyle changes/weight loss.  Fall/gait disturbance: --PT/OT evaluation: Recommend SNF placement, social work for coordination.   DVT prophylaxis: Lovenox Code Status: Full code Family Communication: Updated patient extensively at bedside  Disposition Plan:  Status is: Inpatient  Remains inpatient appropriate because:Unsafe d/c plan and IV treatments appropriate due to intensity of illness or inability to take PO likely will need SNF placement due to his gait disturbance and history of schizophrenia unable to take care for himself in which she lives alone at home.  Likely will need to complete remdesivir inpatient which will be on 06/11/2020 then will be ready for discharge.   Dispo: The patient is from: Home              Anticipated d/c is to: SNF              Anticipated d/c date is: 1 day              Patient currently is medically stable to d/c.   Consultants:   None  Procedures:   EEG  Antimicrobials:   Remdesivir 8/31>>   Subjective: Patient seen and examined bedside, resting comfortably.  No complaints overnight.  Continues on remdesivir infusion.  Awaiting SNF placement.  Denies headache, no dizziness, no chest pain, palpitations, no shortness of breath, no abdominal pain.  No acute events overnight per nursing staff.  Objective: Vitals:   06/09/20 0408 06/09/20 1324 06/09/20 2110 06/10/20 0608  BP: (!) 142/82 135/78 137/82 138/88  Pulse: 79 91 79 87  Resp: 18 16 18    Temp: 97.9 F (36.6 C) 98.4 F (36.9 C)  98.6 F (37 C) 98.1 F (36.7 C)  TempSrc:  Oral  Oral  SpO2: 91% 96% 95% 100%  Weight:      Height:        Intake/Output Summary (Last 24 hours) at 06/10/2020 1117 Last data filed at 06/10/2020 0600 Gross per 24 hour  Intake 1044 ml  Output 600 ml  Net 444 ml   Filed Weights   06/08/20 1313  Weight: 90.9 kg    Examination:  General exam: Appears calm and comfortable  Respiratory system: Clear to auscultation. Respiratory effort normal.  Oxygen well on room air Cardiovascular system: S1 & S2 heard, RRR. No JVD, murmurs, rubs, gallops or clicks. No pedal edema. Gastrointestinal system: Abdomen is nondistended, soft and nontender. No organomegaly or masses felt. Normal bowel sounds heard. Central nervous system: Alert and oriented to person/place/time. No focal neurological deficits. Extremities: Symmetric 5 x 5 power. Skin: No rashes, lesions or ulcers Psychiatry: Judgement and insight appear poor.  Normal mood, flat affect    Data Reviewed: I have personally reviewed following labs and imaging studies  CBC: Recent Labs  Lab 06/06/20 2017 06/06/20 2017 06/07/20 0235 06/08/20 0743 06/08/20 1242 06/09/20 0342 06/10/20 0412  WBC 16.3*   < > 11.4* 11.6* 13.7* 15.2* 15.4*  NEUTROABS 13.7*  --  8.4* 8.9*  --  12.9* 11.7*  HGB 12.5*   < > 11.7* 12.5* 13.5 12.2* 12.7*  HCT 37.8*   < >  35.4* 37.5* 40.0 37.2* 38.3*  MCV 87.7   < > 86.6 86.4 86.8 87.7 87.2  PLT 369   < > 335 350 374 353 369   < > = values in this interval not displayed.   Basic Metabolic Panel: Recent Labs  Lab 06/06/20 2017 06/06/20 2017 06/07/20 0235 06/08/20 0743 06/08/20 1242 06/09/20 0342 06/10/20 0412  NA 140  --  138 142  --  141 137  K 3.9  --  3.6 4.0  --  3.5 3.7  CL 105  --  105 107  --  108 102  CO2 22  --  22 26  --  23 23  GLUCOSE 128*  --  127* 124*  --  117* 105*  BUN 26*  --  25* 22  --  25* 30*  CREATININE 1.36*   < > 1.12 1.03 0.99 1.12 1.05  CALCIUM 9.6  --  9.3 8.8*  --  8.8*  8.7*  MG  --   --   --   --   --  1.7 1.5*   < > = values in this interval not displayed.   GFR: Estimated Creatinine Clearance: 77.4 mL/min (by C-G formula based on SCr of 1.05 mg/dL). Liver Function Tests: Recent Labs  Lab 06/07/20 0235 06/08/20 0743 06/09/20 0342 06/10/20 0412  AST 66* 39 29 31  ALT 31 27 24 26   ALKPHOS 47 45 48 53  BILITOT 0.6 0.3 0.4 0.6  PROT 7.3 6.9 6.6 6.8  ALBUMIN 3.7 3.3* 3.3* 3.5   No results for input(s): LIPASE, AMYLASE in the last 168 hours. No results for input(s): AMMONIA in the last 168 hours. Coagulation Profile: No results for input(s): INR, PROTIME in the last 168 hours. Cardiac Enzymes: No results for input(s): CKTOTAL, CKMB, CKMBINDEX, TROPONINI in the last 168 hours. BNP (last 3 results) No results for input(s): PROBNP in the last 8760 hours. HbA1C: Recent Labs    06/09/20 0342  HGBA1C 6.5*   CBG: Recent Labs  Lab 06/09/20 0810 06/09/20 1142 06/09/20 1607 06/09/20 2111 06/10/20 0819  GLUCAP 129* 196* 149* 94 102*   Lipid Profile: No results for input(s): CHOL, HDL, LDLCALC, TRIG, CHOLHDL, LDLDIRECT in the last 72 hours. Thyroid Function Tests: No results for input(s): TSH, T4TOTAL, FREET4, T3FREE, THYROIDAB in the last 72 hours. Anemia Panel: Recent Labs    06/09/20 0342 06/10/20 0412  FERRITIN 105 101   Sepsis Labs: Recent Labs  Lab 06/07/20 0235  PROCALCITON 0.40  LATICACIDVEN 1.0    Recent Results (from the past 240 hour(s))  SARS Coronavirus 2 by RT PCR (hospital order, performed in [facility] hospital lab) Nasopharyngeal Nasopharyngeal Swab     Status: Abnormal   Collection Time: 06/06/20 11:42 PM   Specimen: Nasopharyngeal Swab  Result Value Ref Range Status   SARS Coronavirus 2 POSITIVE (A) NEGATIVE Final    Comment: RESULT CALLED TO, READ BACK BY AND VERIFIED WITH: LISA ADKINS RN @1242  06/07/2020 OLSONM (NOTE) SARS-CoV-2 target nucleic acids are DETECTED  SARS-CoV-2 RNA is generally detectable  in upper respiratory specimens  during the acute phase of infection.  Positive results are indicative  of the presence of the identified virus, but do not rule out bacterial infection or co-infection with other pathogens not detected by the test.  Clinical correlation with patient history and  other diagnostic information is necessary to determine patient infection status.  The expected result is negative.  Fact Sheet for Patients:    Fact Sheet for Healthcare Providers:   https://pope.com/    This test is not yet approved or cleared by the [place] FDA and  has been authorized for detection and/or diagnosis of SARS-CoV-2 by FDA under an Emergency Use Authorization (EUA).  This EUA will remain in effect (meaning thi s test can be used) for the duration of  the COVID-19 declaration under Section 564(b)(1) of the Act, 21 U.S.C. section 360-bbb-3(b)(1), unless the authorization is terminated or revoked sooner.  Performed at [facility], [address] [place], [place] [address]   Blood Culture (routine x 2)     Status: None (Preliminary result)   Collection Time: 06/07/20  2:35 AM   Specimen: BLOOD RIGHT ARM  Result Value Ref Range Status   Specimen Description   Final    BLOOD RIGHT ARM Performed at [facility], [address] [place], [place] [address]    Special Requests   Final    BOTTLES DRAWN AEROBIC AND ANAEROBIC Blood Culture adequate volume Performed at [facility], [address] [place], [place] [address]    Culture   Final    NO GROWTH 2 DAYS Performed at [facility], [address], [place], [place] [address]    Report Status PENDING  Incomplete  Blood Culture (routine x 2)     Status: None (Preliminary result)   Collection Time: 06/07/20  2:40 AM   Specimen: BLOOD RIGHT HAND  Result Value Ref Range Status   Specimen Description   Final     BLOOD RIGHT HAND Performed at [facility], [address] [place], [place] [address]    Special Requests   Final    BOTTLES DRAWN AEROBIC AND ANAEROBIC Blood Culture adequate volume Performed at [facility], [address] [place], [place] [address]    Culture   Final    NO GROWTH 2 DAYS Performed at [facility], [address], [place], [place] [address]    Report Status PENDING  Incomplete      Radiology Studies: EEG  Result Date: 06/08/2020 Charlsie Quest, MD     06/08/2020  4:36 PM Patient Name: Layn Kye MRN: [id number] Epilepsy Attending: Charlsie Quest Referring Physician/Provider: Dr Whitney Post Date: 06/08/2020 Duration: 22.53 mins Patient history: 8to M with ams. Reportedly had been found down with increased urinary incontinence and blank stares and has a remote history of seizures 10+ years ago not on AEDs. EEG to evaluate for seizure, Level of alertness: Awake AEDs during EEG study: Gabapentin Technical aspects: This EEG study was done with scalp electrodes positioned according to the 10-20 International system of electrode placement. Electrical activity was acquired at a sampling rate of  and reviewed with a high frequency filter of  and a low frequency filter of . EEG data were recorded continuously and digitally stored. Description: The posterior dominant rhythm consists of 9 Hz activity of moderate voltage (25-35 uV) seen predominantly in posterior head regions, symmetric and reactive to eye opening and eye closing. Physiologic photic driving was not seen during photic stimulation.  Hyperventilation was not performed.   IMPRESSION: This study is within normal limits. No seizures or epileptiform discharges were seen throughout the recording. Priyanka Annabelle Harman      Scheduled Meds: . amLODipine  10 mg Oral Q supper  . vitamin C  500 mg Oral Daily  . atorvastatin  20 mg  Oral Q supper  . benztropine  0.5 mg Oral QODAY  .  enoxaparin (LOVENOX) injection  40 mg Subcutaneous Q24H  . fluPHENAZine  5 mg Oral Q supper  . gabapentin  300 mg Oral Q supper  . insulin aspart  0-20 Units Subcutaneous TID WC  . insulin detemir  0.15 Units/kg Subcutaneous Daily  . linagliptin  5 mg Oral Q supper  . lisinopril  40 mg Oral Q supper  . methylPREDNISolone (SOLU-MEDROL) injection  0.25 mg/kg Intravenous Q12H  . tamsulosin  0.4 mg Oral QPC supper  . traZODone  50 mg Oral Q supper  . zinc sulfate  220 mg Oral Daily   Continuous Infusions: . remdesivir 100 mg in NS 100 mL 100 mg (06/09/20 0946)     LOS: 2 days    Time spent: 36 minutes spent on chart review, discussion with nursing staff, consultants, updating family and interview/physical exam; more than 50% of that time was spent in counseling and/or coordination of care.    Alvira Philips [place], DO [facility] Available via Epic secure chat 7am-7pm After these hours, please refer to coverage provider listed on [email or web address] 06/10/2020, 11:17 AM

## 2020-06-11 LAB — GLUCOSE, CAPILLARY
Glucose-Capillary: 119 mg/dL — ABNORMAL HIGH (ref 70–99)
Glucose-Capillary: 223 mg/dL — ABNORMAL HIGH (ref 70–99)
Glucose-Capillary: 179 mg/dL — ABNORMAL HIGH (ref 70–99)
Glucose-Capillary: 169 mg/dL — ABNORMAL HIGH (ref 70–99)

## 2020-06-11 LAB — CBC WITH DIFFERENTIAL/PLATELET
Abs Immature Granulocytes: 0.12 10*3/uL — ABNORMAL HIGH (ref 0.00–0.07)
Basophils Absolute: 0 10*3/uL (ref 0.0–0.1)
Basophils Relative: 0 %
Eosinophils Absolute: 0.2 10*3/uL (ref 0.0–0.5)
Eosinophils Relative: 1 %
HCT: 36.6 % — ABNORMAL LOW (ref 39.0–52.0)
Hemoglobin: 12.3 g/dL — ABNORMAL LOW (ref 13.0–17.0)
Immature Granulocytes: 1 %
Lymphocytes Relative: 9 %
Lymphs Abs: 1.4 10*3/uL (ref 0.7–4.0)
MCH: 29.3 pg (ref 26.0–34.0)
MCHC: 33.6 g/dL (ref 30.0–36.0)
MCV: 87.1 fL (ref 80.0–100.0)
Monocytes Absolute: 1.5 10*3/uL — ABNORMAL HIGH (ref 0.1–1.0)
Monocytes Relative: 10 %
Neutro Abs: 12 10*3/uL — ABNORMAL HIGH (ref 1.7–7.7)
Neutrophils Relative %: 79 %
Platelets: 375 10*3/uL (ref 150–400)
RBC: 4.2 MIL/uL — ABNORMAL LOW (ref 4.22–5.81)
RDW: 13.3 % (ref 11.5–15.5)
WBC: 15.3 10*3/uL — ABNORMAL HIGH (ref 4.0–10.5)
nRBC: 0 % (ref 0.0–0.2)

## 2020-06-11 LAB — COMPREHENSIVE METABOLIC PANEL
ALT: 25 U/L (ref 0–44)
AST: 22 U/L (ref 15–41)
Albumin: 3.2 g/dL — ABNORMAL LOW (ref 3.5–5.0)
Alkaline Phosphatase: 54 U/L (ref 38–126)
Anion gap: 11 (ref 5–15)
BUN: 29 mg/dL — ABNORMAL HIGH (ref 8–23)
CO2: 24 mmol/L (ref 22–32)
Calcium: 8.8 mg/dL — ABNORMAL LOW (ref 8.9–10.3)
Chloride: 101 mmol/L (ref 98–111)
Creatinine, Ser: 1.05 mg/dL (ref 0.61–1.24)
GFR calc Af Amer: >60 mL/min (ref >60)
GFR calc non Af Amer: >60 mL/min (ref >60)
Glucose, Bld: 116 mg/dL — ABNORMAL HIGH (ref 70–99)
Potassium: 3.4 mmol/L — ABNORMAL LOW (ref 3.5–5.1)
Sodium: 136 mmol/L (ref 135–145)
Total Bilirubin: 0.5 mg/dL (ref 0.3–1.2)
Total Protein: 6.4 g/dL — ABNORMAL LOW (ref 6.5–8.1)

## 2020-06-11 LAB — C-REACTIVE PROTEIN: CRP: 5.2 mg/dL — ABNORMAL HIGH (ref <1.0)

## 2020-06-11 LAB — D-DIMER, QUANTITATIVE: D-Dimer, Quant: 1.91 ug/mL-FEU — ABNORMAL HIGH (ref 0.00–0.50)

## 2020-06-11 LAB — FERRITIN: Ferritin: 103 ng/mL (ref 24–336)

## 2020-06-11 LAB — MAGNESIUM: Magnesium: 1.8 mg/dL (ref 1.7–2.4)

## 2020-06-11 MED ORDER — MAGNESIUM SULFATE 2 GM/50ML IV SOLN
2.0000 g | Freq: Once | INTRAVENOUS | Status: AC
  Administered 2020-06-11: 2 g via INTRAVENOUS
  Filled 2020-06-11: qty 50

## 2020-06-11 MED ORDER — POTASSIUM CHLORIDE CRYS ER 20 MEQ PO TBCR
30.0000 meq | EXTENDED_RELEASE_TABLET | ORAL | Status: AC
  Administered 2020-06-11 (×2): 30 meq via ORAL
  Filled 2020-06-11 (×2): qty 1

## 2020-06-11 NOTE — Progress Notes (Signed)
PROGRESS NOTE    Jaime Lopez  CWC:[id number] DOB: 05-Oct-1951 DOA: 06/06/2020 PCP: Jolene Provost, MD    Brief Narrative:  Jaime Lopez is a 69 year old male with a history of schizophrenia, diabetes, hypertension, hyperlipidemia, remote seizures 10 years ago not on AEDs who has been vaccinated against COVID 19 who was noted to have an increase in urinary incontinence, blank stares and generalized weakness x 1 week by his brother who also apparently found the patient on the floor of his home on 8/31, prompting presentation to [facility]. The patient was diagnosed with COVID 19.   Currently, the patient states he recently traveled out of town and soon after began feeling ill. He has had a dry cough and generalized weakness most notable in his upper extremities. Denies chest pain, palpitations, shortness of breath, nausea, vomiting, leg swelling or any other discomfort.   In the ED, patient was initially hemodynamically stable on room air. CXR negative, CT head without acute changes but noted chronic small vessel ischemic changes. Initial labs notable for LDH 240, CRP 5.7, Lactic acid 1.0, WBC 11.4->11.6, D Dimer 2.79 -> 3.14, fibrinogen 656. He was given dexamethasone and remdesivir. Plan was to potentially discharge from [facility] ED today, however patient had an unsteady gait and desatted to 89%, prompting transfer to [facility] and further treatment for COVID.   Assessment & Plan:   Principal Problem:   Generalized weakness Active Problems:   Type 2 diabetes mellitus without complication (HCC)   Hyperlipemia   Hypertension   Schizophrenia (HCC)   Acute encephalopathy   SIRS (systemic inflammatory response syndrome) (HCC)   COVID-19 virus infection   Acute Covid-19 viral infection during the ongoing 2020 Covid 19 Pandemic - POA Patient brought to med [facility] for staring spells, progressive weakness.  Has reportedly been vaccinated against Covid-19. --COVID test: PCR + 06/06/2020 --CRP  5.7>4.3>2.9>2.1>5.2 --ddimer 2.78>3.14>1.87>2.64>1.91 --Remdesivir, plan 5-day course (Day #5/5) --Discontinued steroids on 9/4; been oxygenating well on room air --prone for 2-3hrs every 12hrs if able --Continue supplemental oxygen, titrate to maintain SPO2 greater than 92%; currently on room air with SPO2 100%. --Continue supportive care with albuterol MDI prn, vitamin C, zinc, Tylenol, antitussives (benzonatate/ Mucinex/Tussionex) --Follow CBC, CMP, D-dimer, ferritin, and CRP daily --Continue airborne/contact isolation precautions for 3 weeks from the day of diagnosis  The treatment plan and use of medications and known side effects were discussed with patient/family. Some of the medications used are based on case reports/anecdotal data.  All other medications being used in the management of COVID-19 based on limited study data.  Complete risks and long-term side effects are unknown, however in the best clinical judgment they seem to be of some benefit.  Patient wanted to proceed with treatment options provided.  Acute encephalopathy, unclear etiology: Resolved Patient brother reports that patient has been experiencing "staring spells" with his "mouth open".  Initially considered absence seizure in the differential.  CT head without contrast with no acute intracranial findings.  EEG with no seizure-like/epileptiform activity.  It appears that his confusion/encephalopathy is now resolved and his mentation is at baseline.  Type 2 diabetes mellitus On Janumet 50-1000 mg p.o. twice daily at home.  Hemoglobin A1c 6.5, well controlled. --Hold home diabetic regimen while inpatient --Levemir 14 subcutaneously daily --Tradjenta 5 mg p.o. daily --Insulin sliding scale for coverage --CBGs before every meal/at bedtime  Elevated creatinine 1.36 on presentation, suspect poor oral intake/dehydration.  Last creatinine 0.9 in 2018. --Cr 1.36>1.12>1.05 --Avoid nephrotoxins, renally dose all  medications --Follow BMP daily  Schizophrenia Follows with Dr. Amanda CockayneAdigori, outpatient --Continue trazodone 50 mg p.o. nightly --Cogentin 0.5 mg every other day --Fluphenazine 5 mg p.o. daily  HLD: Atorvastatin 20 mg p.o. daily  Essential hypertension BP 138/88 this morning. --Continue amlodipine 10 mg p.o. daily --Continue lisinopril 40 mg p.o. daily --Continue monitor blood pressure closely  Obesity Body mass index is 28.75 kg/m.  Counseled on lifestyle changes/weight loss.  Fall/gait disturbance: --PT/OT evaluation: Recommend SNF placement, social work for coordination.   DVT prophylaxis: Lovenox Code Status: Full code Family Communication: Updated patient extensively at bedside  Disposition Plan:  Status is: Inpatient  Remains inpatient appropriate because:Unsafe d/c plan and IV treatments appropriate due to intensity of illness or inability to take PO likely will need SNF placement due to his gait disturbance and history of schizophrenia unable to take care for himself in which she lives alone at home.  Likely will need to complete remdesivir inpatient which will be on 06/11/2020 then will be ready for discharge.   Dispo: The patient is from: Home              Anticipated d/c is to: SNF              Anticipated d/c date is: 1 day              Patient currently is medically stable to d/c.   Consultants:   None  Procedures:   EEG  Antimicrobials:   Remdesivir 8/31>>   Subjective: Patient seen and examined bedside, resting comfortably.  No complaints overnight.  Continues on remdesivir infusion, to complete today.  Awaiting SNF placement.  Denies headache, no dizziness, no chest pain, palpitations, no shortness of breath, no abdominal pain.  No acute events overnight per nursing staff.  Objective: Vitals:   06/10/20 0608 06/10/20 1557 06/10/20 2240 06/11/20 0628  BP: 138/88 (!) 159/92 137/84 123/73  Pulse: 87 98 82 86  Resp:  19 16 18   Temp: 98.1 F (36.7  C) 98.2 F (36.8 C) 98.2 F (36.8 C) 98.3 F (36.8 C)  TempSrc: Oral     SpO2: 100% 92% 92% 91%  Weight:      Height:        Intake/Output Summary (Last 24 hours) at 06/11/2020 1110 Last data filed at 06/11/2020 0800 Gross per 24 hour  Intake 236 ml  Output 675 ml  Net -439 ml   Filed Weights   06/08/20 1313  Weight: 90.9 kg    Examination:  General exam: Appears calm and comfortable  Respiratory system: Clear to auscultation. Respiratory effort normal.  Oxygenating well on room air Cardiovascular system: S1 & S2 heard, RRR. No JVD, murmurs, rubs, gallops or clicks. No pedal edema. Gastrointestinal system: Abdomen is nondistended, soft and nontender. No organomegaly or masses felt. Normal bowel sounds heard. Central nervous system: Alert and oriented to person/place/time. No focal neurological deficits. Extremities: Symmetric 5 x 5 power. Skin: No rashes, lesions or ulcers Psychiatry: Judgement and insight appear poor.  Normal mood, flat affect    Data Reviewed: I have personally reviewed following labs and imaging studies  CBC: Recent Labs  Lab 06/07/20 0235 06/07/20 0235 06/08/20 0743 06/08/20 1242 06/09/20 0342 06/10/20 0412 06/11/20 0516  WBC 11.4*   < > 11.6* 13.7* 15.2* 15.4* 15.3*  NEUTROABS 8.4*  --  8.9*  --  12.9* 11.7* 12.0*  HGB 11.7*   < > 12.5* 13.5 12.2* 12.7* 12.3*  HCT 35.4*   < >  37.5* 40.0 37.2* 38.3* 36.6*  MCV 86.6   < > 86.4 86.8 87.7 87.2 87.1  PLT 335   < > 350 374 353 369 375   < > = values in this interval not displayed.   Basic Metabolic Panel: Recent Labs  Lab 06/07/20 0235 06/07/20 0235 06/08/20 0743 06/08/20 1242 06/09/20 0342 06/10/20 0412 06/11/20 0516  NA 138  --  142  --  141 137 136  K 3.6  --  4.0  --  3.5 3.7 3.4*  CL 105  --  107  --  108 102 101  CO2 22  --  26  --  GLUCOSE 127*  --  124*  --  117* 105* 116*  BUN 25*  --  22  --  25* 30* 29*  CREATININE 1.12   < > 1.03 0.99 1.12 1.05 1.05  CALCIUM 9.3   --  8.8*  --  8.8* 8.7* 8.8*  MG  --   --   --   --  1.7 1.5* 1.8   < > = values in this interval not displayed.   GFR: Estimated Creatinine Clearance: 77.4 mL/min (by C-G formula based on SCr of 1.05 mg/dL). Liver Function Tests: Recent Labs  Lab 06/07/20 0235 06/08/20 0743 06/09/20 0342 06/10/20 0412 06/11/20 0516  AST 66* 39 ALT ALKPHOS 47 45 48 53 54  BILITOT 0.6 0.3 0.4 0.6 0.5  PROT 7.3 6.9 6.6 6.8 6.4*  ALBUMIN 3.7 3.3* 3.3* 3.5 3.2*   No results for input(s): LIPASE, AMYLASE in the last 168 hours. No results for input(s): AMMONIA in the last 168 hours. Coagulation Profile: No results for input(s): INR, PROTIME in the last 168 hours. Cardiac Enzymes: No results for input(s): CKTOTAL, CKMB, CKMBINDEX, TROPONINI in the last 168 hours. BNP (last 3 results) No results for input(s): PROBNP in the last 8760 hours. HbA1C: Recent Labs    06/09/20 0342  HGBA1C 6.5*   CBG: Recent Labs  Lab 06/10/20 0819 06/10/20 1203 06/10/20 1730 06/10/20 2241 06/11/20 0751  GLUCAP 102* 276* 209* 95 119*   Lipid Profile: No results for input(s): CHOL, HDL, LDLCALC, TRIG, CHOLHDL, LDLDIRECT in the last 72 hours. Thyroid Function Tests: No results for input(s): TSH, T4TOTAL, FREET4, T3FREE, THYROIDAB in the last 72 hours. Anemia Panel: Recent Labs    06/10/20 0412 06/11/20 0516  FERRITIN 101 103   Sepsis Labs: Recent Labs  Lab 06/07/20 0235  PROCALCITON 0.40  LATICACIDVEN 1.0    Recent Results (from the past 240 hour(s))  SARS Coronavirus 2 by RT PCR (hospital order, performed in [facility] hospital lab) Nasopharyngeal Nasopharyngeal Swab     Status: Abnormal   Collection Time: 06/06/20 11:42 PM   Specimen: Nasopharyngeal Swab  Result Value Ref Range Status   SARS Coronavirus 2 POSITIVE (A) NEGATIVE Final    Comment: RESULT CALLED TO, READ BACK BY AND VERIFIED WITH: LISA ADKINS RN  06/07/2020 OLSONM (NOTE) SARS-CoV-2 target nucleic  acids are DETECTED  SARS-CoV-2 RNA is generally detectable in upper respiratory specimens  during the acute phase of infection.  Positive results are indicative  of the presence of the identified virus, but do not rule out bacterial infection or co-infection with other pathogens not detected by the test.  Clinical correlation with patient history and  other diagnostic information is necessary to determine patient infection status.  The expected result is negative.  Fact Sheet  for Patients:   BoilerBrush.com.cy   Fact Sheet for Healthcare Providers:   https://pope.com/    This test is not yet approved or cleared by the [place] FDA and  has been authorized for detection and/or diagnosis of SARS-CoV-2 by FDA under an Emergency Use Authorization (EUA).  This EUA will remain in effect (meaning thi s test can be used) for the duration of  the COVID-19 declaration under Section 564(b)(1) of the Act, 21 U.S.C. section 360-bbb-3(b)(1), unless the authorization is terminated or revoked sooner.  Performed at [facility], [address] [place], [place] [address]   Blood Culture (routine x 2)     Status: None (Preliminary result)   Collection Time: 06/07/20  2:35 AM   Specimen: BLOOD RIGHT ARM  Result Value Ref Range Status   Specimen Description   Final    BLOOD RIGHT ARM Performed at [facility], [address] [place], [place] [address]    Special Requests   Final    BOTTLES DRAWN AEROBIC AND ANAEROBIC Blood Culture adequate volume Performed at [facility], [address] [place], [place] [address]    Culture   Final    NO GROWTH 4 DAYS Performed at [facility], [address] [place], [place] [address]    Report Status PENDING  Incomplete  Blood Culture (routine x 2)     Status: None (Preliminary result)   Collection Time: 06/07/20  2:40 AM   Specimen: BLOOD RIGHT HAND  Result  Value Ref Range Status   Specimen Description   Final    BLOOD RIGHT HAND Performed at [facility], [address] [place], [place] [address]    Special Requests   Final    BOTTLES DRAWN AEROBIC AND ANAEROBIC Blood Culture adequate volume Performed at [facility], [address] [place], [place] [address]    Culture   Final    NO GROWTH 4 DAYS Performed at [facility], [address] [place], [place] [address]    Report Status PENDING  Incomplete      Radiology Studies: No results found.    Scheduled Meds: . amLODipine  10 mg Oral Q supper  . vitamin C  500 mg Oral Daily  . atorvastatin  20 mg Oral Q supper  . benztropine  0.5 mg Oral QODAY  . enoxaparin (LOVENOX) injection  40 mg Subcutaneous Q24H  . fluPHENAZine  5 mg Oral Q supper  . gabapentin  300 mg Oral Q supper  . insulin aspart  0-20 Units Subcutaneous TID WC  . insulin detemir  0.15 Units/kg Subcutaneous Daily  . linagliptin  5 mg Oral Q supper  . lisinopril  40 mg Oral Q supper  . potassium chloride  30 mEq Oral Q3H  . tamsulosin  0.4 mg Oral QPC supper  . traZODone  50 mg Oral Q supper  . zinc sulfate  220 mg Oral Daily   Continuous Infusions: . magnesium sulfate bolus IVPB    . remdesivir 100 mg in NS 100 mL 100 mg (06/11/20 1055)     LOS: 3 days    Time spent: 36 minutes spent on chart review, discussion with nursing staff, consultants, updating family and interview/physical exam; more than 50% of that time was spent in counseling and/or coordination of care.    Alvira Philips [place], DO [facility] Available via Epic secure chat 7am-7pm After these  hours, please refer to coverage provider listed on [email or web address] 06/11/2020, 11:10 AM

## 2020-06-12 LAB — COMPREHENSIVE METABOLIC PANEL
ALT: 26 U/L (ref 0–44)
AST: 21 U/L (ref 15–41)
Albumin: 3.2 g/dL — ABNORMAL LOW (ref 3.5–5.0)
Alkaline Phosphatase: 55 U/L (ref 38–126)
Anion gap: 9 (ref 5–15)
BUN: 34 mg/dL — ABNORMAL HIGH (ref 8–23)
CO2: 26 mmol/L (ref 22–32)
Calcium: 8.8 mg/dL — ABNORMAL LOW (ref 8.9–10.3)
Chloride: 104 mmol/L (ref 98–111)
Creatinine, Ser: 1.23 mg/dL (ref 0.61–1.24)
GFR calc Af Amer: >60 mL/min (ref >60)
GFR calc non Af Amer: >60 mL/min (ref >60)
Glucose, Bld: 113 mg/dL — ABNORMAL HIGH (ref 70–99)
Potassium: 4.7 mmol/L (ref 3.5–5.1)
Sodium: 139 mmol/L (ref 135–145)
Total Bilirubin: 0.4 mg/dL (ref 0.3–1.2)
Total Protein: 6 g/dL — ABNORMAL LOW (ref 6.5–8.1)

## 2020-06-12 LAB — CULTURE, BLOOD (ROUTINE X 2)
Culture: NO GROWTH
Culture: NO GROWTH
Special Requests: ADEQUATE
Special Requests: ADEQUATE

## 2020-06-12 LAB — CBC WITH DIFFERENTIAL/PLATELET
Abs Immature Granulocytes: 0.07 10*3/uL (ref 0.00–0.07)
Basophils Absolute: 0.1 10*3/uL (ref 0.0–0.1)
Basophils Relative: 1 %
Eosinophils Absolute: 0.3 10*3/uL (ref 0.0–0.5)
Eosinophils Relative: 2 %
HCT: 36.1 % — ABNORMAL LOW (ref 39.0–52.0)
Hemoglobin: 11.7 g/dL — ABNORMAL LOW (ref 13.0–17.0)
Immature Granulocytes: 1 %
Lymphocytes Relative: 17 %
Lymphs Abs: 1.9 10*3/uL (ref 0.7–4.0)
MCH: 28.7 pg (ref 26.0–34.0)
MCHC: 32.4 g/dL (ref 30.0–36.0)
MCV: 88.7 fL (ref 80.0–100.0)
Monocytes Absolute: 1.2 10*3/uL — ABNORMAL HIGH (ref 0.1–1.0)
Monocytes Relative: 11 %
Neutro Abs: 7.3 10*3/uL (ref 1.7–7.7)
Neutrophils Relative %: 68 %
Platelets: 368 10*3/uL (ref 150–400)
RBC: 4.07 MIL/uL — ABNORMAL LOW (ref 4.22–5.81)
RDW: 13.7 % (ref 11.5–15.5)
WBC: 10.7 10*3/uL — ABNORMAL HIGH (ref 4.0–10.5)
nRBC: 0 % (ref 0.0–0.2)

## 2020-06-12 LAB — FERRITIN: Ferritin: 97 ng/mL (ref 24–336)

## 2020-06-12 LAB — GLUCOSE, CAPILLARY
Glucose-Capillary: 124 mg/dL — ABNORMAL HIGH (ref 70–99)
Glucose-Capillary: 246 mg/dL — ABNORMAL HIGH (ref 70–99)
Glucose-Capillary: 117 mg/dL — ABNORMAL HIGH (ref 70–99)
Glucose-Capillary: 188 mg/dL — ABNORMAL HIGH (ref 70–99)

## 2020-06-12 LAB — C-REACTIVE PROTEIN: CRP: 5.3 mg/dL — ABNORMAL HIGH (ref <1.0)

## 2020-06-12 LAB — D-DIMER, QUANTITATIVE: D-Dimer, Quant: 1.04 ug/mL-FEU — ABNORMAL HIGH (ref 0.00–0.50)

## 2020-06-12 LAB — MAGNESIUM: Magnesium: 2.3 mg/dL (ref 1.7–2.4)

## 2020-06-12 MED ORDER — GUAIFENESIN-DM 100-10 MG/5ML PO SYRP
5.0000 mL | ORAL_SOLUTION | ORAL | Status: DC | PRN
  Filled 2020-06-12: qty 10

## 2020-06-12 NOTE — Progress Notes (Signed)
Physical Therapy Treatment Patient Details Name: Jaime Lopez MRN: [id number] DOB: 27-Jun-1951 Today's Date: 06/12/2020    History of Present Illness Jaime Lopez is a 69 year old male with a history of schizophrenia, diabetes, hypertension, hyperlipidemia, remote seizures 10 years ago not on AEDs who has been vaccinated against COVID 19 who was noted to have an increase in urinary incontinence, blank stares and generalized weakness x 1 week by his brother who also apparently found the patient on the floor of his home on 8/31    PT Comments    Patient making steady progress with acute PT. He continues to require multimodal cues for sequencing bed mobility and functional transfers. Pt with increased posterior lean in sitting today and required UE support on bed rails to maintain balance; no posterior lean observed in standing. He was able to increase gait distance today but requires min assist and cues for safe proximity to walker and planning turns. Pt saturating well on RA at 94-96% during mobility. Acute PT will continue to progress pt as able. Recommendations for discharge remain appropriate.    Follow Up Recommendations  SNF;Supervision/Assistance - 24 hour     Equipment Recommendations  Rolling walker with 5" wheels (TBA)    Recommendations for Other Services       Precautions / Restrictions Precautions Precautions: Fall Restrictions Weight Bearing Restrictions: No    Mobility  Bed Mobility Overal bed mobility: Needs Assistance Bed Mobility: Supine to Sit     Supine to sit: Mod assist;HOB elevated     General bed mobility comments: verbal/tactile cues required for sequencing. Assist needed to bring LE's off EOB. Mod assist to scoot forward and place feet on floor, Min-Mod assist to maintain seated balance at EOB  Transfers Overall transfer level: Needs assistance Equipment used: Rolling walker (2 wheeled) Transfers: Sit to/from Stand Sit to Stand: Min assist;From  elevated surface         General transfer comment: Cues for safe hand placement on RW, Min assist from slightly elevated EOB to raise up.   Ambulation/Gait Ambulation/Gait assistance: Min assist Gait Distance (Feet): 120 Feet Assistive device: Rolling walker (2 wheeled) Gait Pattern/deviations: Step-through pattern;Decreased step length - right;Decreased step length - left;Decreased stride length;Trunk flexed Gait velocity: decr   General Gait Details: verbal/tactile cues for safe hand placement and safe proximity. pt required instruction to plan turns. pt saturating in 90's throughout gait, resting at 94-96% on RA sitting after gait.   Stairs             Wheelchair Mobility    Modified Rankin (Stroke Patients Only)       Balance Overall balance assessment: Needs assistance Sitting-balance support: Feet supported;Bilateral upper extremity supported Sitting balance-Leahy Scale: Poor Sitting balance - Comments: Poor to Fair balance sitting EOB. pt with significant posterior lean this date and required min-mod assist to prevent posterior lean back onto bed. pt with 3 episodes requiring assist after sittign for ~1 minute. cues to hold EOB and bed rail improved pt's balance.  Postural control: Posterior lean Standing balance support: Bilateral upper extremity supported;During functional activity Standing balance-Leahy Scale: Poor Standing balance comment: requires external support, no posterior lean seen in standing         Cognition Arousal/Alertness: Awake/alert Behavior During Therapy: Flat affect Overall Cognitive Status: Within Functional Limits for tasks assessed         Exercises      General Comments        Pertinent Vitals/Pain Pain Assessment: No/denies  pain           PT Goals (current goals can now be found in the care plan section) Acute Rehab PT Goals Patient Stated Goal: none stated, pt open to rehab PT Goal Formulation: With patient Time  For Goal Achievement: 06/23/20 Potential to Achieve Goals: Good Progress towards PT goals: Progressing toward goals    Frequency    Min 3X/week      PT Plan Current plan remains appropriate       AM-PAC PT "6 Clicks" Mobility   Outcome Measure  Help needed turning from your back to your side while in a flat bed without using bedrails?: A Little Help needed moving from lying on your back to sitting on the side of a flat bed without using bedrails?: A Lot Help needed moving to and from a bed to a chair (including a wheelchair)?: A Lot Help needed standing up from a chair using your arms (e.g., wheelchair or bedside chair)?: A Little Help needed to walk in hospital room?: A Little Help needed climbing 3-5 steps with a railing? : A Lot 6 Click Score: 15    End of Session Equipment Utilized During Treatment: Gait belt Activity Tolerance: Patient tolerated treatment well Patient left: in chair;with call bell/phone within reach;with chair alarm set Nurse Communication: Mobility status PT Visit Diagnosis: Unsteadiness on feet (R26.81);Muscle weakness (generalized) (M62.81);Difficulty in walking, not elsewhere classified (R26.2)     Time: 2423-5361 PT Time Calculation (min) (ACUTE ONLY): 24 min  Charges:  $Gait Training: 8-22 mins $Therapeutic Activity: 8-22 mins                     Wynn Maudlin, DPT Acute Rehabilitation Services  Office [phone] Pager [phone]  06/12/2020 4:04 PM

## 2020-06-12 NOTE — TOC Progression Note (Addendum)
Transition of Care [facility]) - Progression Note    Patient Details  Name: Jaime Lopez MRN: [id number] Date of Birth: Jan 19, 1951  Transition of Care [facility] CM/SW Contact  Golda Acre, RN Phone Number: 06/12/2020, 9:38 AM  Clinical Narrative:    Requested information sent by Daryel Gerald on [phone]. Labor day-passar office closed. [facility] has accepted patient with a bed offer per the list.  Expected Discharge Plan: Skilled Nursing Facility Barriers to Discharge: SNF Pending bed offer  Expected Discharge Plan and Services Expected Discharge Plan: Skilled Nursing Facility   Discharge Planning Services: CM Consult Post Acute Care Choice: Skilled Nursing Facility Living arrangements for the past 2 months: Apartment                                       Social Determinants of Health (SDOH) Interventions    Readmission Risk Interventions No flowsheet data found.

## 2020-06-12 NOTE — Progress Notes (Signed)
PROGRESS NOTE    Jaime Lopez  LFY:[id number] DOB: 09/19/1951 DOA: 06/06/2020 PCP: Jolene Provost, MD    Brief Narrative:  Jaime Lopez is a 69 year old male with a history of schizophrenia, diabetes, hypertension, hyperlipidemia, remote seizures 10 years ago not on AEDs who has been vaccinated against COVID 19 who was noted to have an increase in urinary incontinence, blank stares and generalized weakness x 1 week by his brother who also apparently found the patient on the floor of his home on 8/31, prompting presentation to [facility]. The patient was diagnosed with COVID 19.   Currently, the patient states he recently traveled out of town and soon after began feeling ill. He has had a dry cough and generalized weakness most notable in his upper extremities. Denies chest pain, palpitations, shortness of breath, nausea, vomiting, leg swelling or any other discomfort.   In the ED, patient was initially hemodynamically stable on room air. CXR negative, CT head without acute changes but noted chronic small vessel ischemic changes. Initial labs notable for LDH 240, CRP 5.7, Lactic acid 1.0, WBC 11.4->11.6, D Dimer 2.79 -> 3.14, fibrinogen 656. He was given dexamethasone and remdesivir. Plan was to potentially discharge from [facility] ED today, however patient had an unsteady gait and desatted to 89%, prompting transfer to [facility] and further treatment for COVID.   Assessment & Plan:   Principal Problem:   Generalized weakness Active Problems:   Type 2 diabetes mellitus without complication (HCC)   Hyperlipemia   Hypertension   Schizophrenia (HCC)   Acute encephalopathy   SIRS (systemic inflammatory response syndrome) (HCC)   COVID-19 virus infection   Acute Covid-19 viral infection during the ongoing 2020 Covid 19 Pandemic - POA Patient brought to med [facility] for staring spells, progressive weakness.  Has reportedly been vaccinated against Covid-19. --COVID test: PCR + 06/06/2020 --CRP  5.7>4.3>2.9>2.1>5.2 --ddimer 2.78>3.14>1.87>2.64>1.91 --Completed 5-day course of remdesivir on 06/11/2020 --Discontinued steroids on 9/4; continues to oxygenate well on room air --prone for 2-3hrs every 12hrs if able --Continue supportive care with albuterol MDI prn, vitamin C, zinc, Tylenol, antitussives (benzonatate/ Mucinex/Tussionex) --Continue airborne/contact isolation precautions for 3 weeks from the day of diagnosis  The treatment plan and use of medications and known side effects were discussed with patient/family. Some of the medications used are based on case reports/anecdotal data.  All other medications being used in the management of COVID-19 based on limited study data.  Complete risks and long-term side effects are unknown, however in the best clinical judgment they seem to be of some benefit.  Patient wanted to proceed with treatment options provided.  Acute encephalopathy, unclear etiology: Resolved Patient brother reports that patient has been experiencing "staring spells" with his "mouth open".  Initially considered absence seizure in the differential.  CT head without contrast with no acute intracranial findings.  EEG with no seizure-like/epileptiform activity.  It appears that his confusion/encephalopathy is now resolved and his mentation is at baseline.  Type 2 diabetes mellitus On Janumet 50-1000 mg p.o. twice daily at home.  Hemoglobin A1c 6.5, well controlled. --Hold home diabetic regimen while inpatient --Levemir 14 subcutaneously daily --Tradjenta 5 mg p.o. daily --Insulin sliding scale for coverage --CBGs before every meal/at bedtime  Elevated creatinine 1.36 on presentation, suspect poor oral intake/dehydration.  Last creatinine 0.9 in 2018. --Cr 1.36>1.12>1.05 --Avoid nephrotoxins, renally dose all medications --Follow BMP daily  Schizophrenia Follows with Dr. Amanda Cockayne, outpatient --Continue trazodone 50 mg p.o. nightly --Cogentin 0.5 mg every other  day --Fluphenazine 5 mg p.o. daily  HLD: Atorvastatin 20 mg p.o. daily  Essential hypertension BP 138/88 this morning. --Continue amlodipine 10 mg p.o. daily --Continue lisinopril 40 mg p.o. daily --Continue monitor blood pressure closely  Obesity Body mass index is 28.75 kg/m.  Counseled on lifestyle changes/weight loss.  Fall/gait disturbance: --PT/OT evaluation: Recommend SNF placement, social work for coordination.   DVT prophylaxis: Lovenox Code Status: Full code Family Communication: Updated patient extensively at bedside  Disposition Plan:  Status is: Inpatient  Remains inpatient appropriate because:Unsafe d/c plan and IV treatments appropriate due to intensity of illness or inability to take PO likely will need SNF placement due to his gait disturbance and history of schizophrenia unable to take care for himself in which she lives alone at home.  Medically stable for discharge.  Pending SNF   Dispo: The patient is from: Home              Anticipated d/c is to: SNF              Anticipated d/c date is: 1 day              Patient currently is medically stable to d/c.   Consultants:   None  Procedures:   EEG  Antimicrobials:   Remdesivir 8/31>>   Subjective: Patient seen and examined bedside, resting comfortably.  No complaints overnight.  Completed remdesivir infusion course yesterday.  Awaiting SNF placement.  Denies headache, no dizziness, no chest pain, palpitations, no shortness of breath, no abdominal pain.  No acute events overnight per nursing staff.  Objective: Vitals:   06/11/20 0628 06/11/20 1357 06/11/20 2124 06/12/20 0538  BP: 123/73 (!) 141/75 131/80 129/80  Pulse: 86 93 85 93  Resp: 18 18 18 18   Temp: 98.3 F (36.8 C) 99.2 F (37.3 C) 98.8 F (37.1 C) 98.6 F (37 C)  TempSrc:  Oral  Oral  SpO2: 91% 96% 97% 93%  Weight:      Height:        Intake/Output Summary (Last 24 hours) at 06/12/2020 1155 Last data filed at 06/12/2020  1100 Gross per 24 hour  Intake 240 ml  Output 450 ml  Net -210 ml   Filed Weights   06/08/20 1313  Weight: 90.9 kg    Examination:  General exam: Appears calm and comfortable  Respiratory system: Clear to auscultation. Respiratory effort normal.  Oxygenating well on room air Cardiovascular system: S1 & S2 heard, RRR. No JVD, murmurs, rubs, gallops or clicks. No pedal edema. Gastrointestinal system: Abdomen is nondistended, soft and nontender. No organomegaly or masses felt. Normal bowel sounds heard. Central nervous system: Alert and oriented to person/place/time. No focal neurological deficits. Extremities: Symmetric 5 x 5 power. Skin: No rashes, lesions or ulcers Psychiatry: Judgement and insight appear poor.  Normal mood, flat affect    Data Reviewed: I have personally reviewed following labs and imaging studies  CBC: Recent Labs  Lab 06/08/20 0743 06/08/20 0743 06/08/20 1242 06/09/20 0342 06/10/20 0412 06/11/20 0516 06/12/20 0518  WBC 11.6*   < > 13.7* 15.2* 15.4* 15.3* 10.7*  NEUTROABS 8.9*  --   --  12.9* 11.7* 12.0* 7.3  HGB 12.5*   < > 13.5 12.2* 12.7* 12.3* 11.7*  HCT 37.5*   < > 40.0 37.2* 38.3* 36.6* 36.1*  MCV 86.4   < > 86.8 87.7 87.2 87.1 88.7  PLT 350   < > 374 353 369 375 368   < > = values  in this interval not displayed.   Basic Metabolic Panel: Recent Labs  Lab 06/08/20 0743 06/08/20 0743 06/08/20 1242 06/09/20 0342 06/10/20 0412 06/11/20 0516 06/12/20 0518  NA 142  --   --  141 137 136 139  K 4.0  --   --  3.5 3.7 3.4* 4.7  CL 107  --   --  108 102 101 104  CO2 26  --   --  23 23 24 26   GLUCOSE 124*  --   --  117* 105* 116* 113*  BUN 22  --   --  25* 30* 29* 34*  CREATININE 1.03   < > 0.99 1.12 1.05 1.05 1.23  CALCIUM 8.8*  --   --  8.8* 8.7* 8.8* 8.8*  MG  --   --   --  1.7 1.5* 1.8 2.3   < > = values in this interval not displayed.   GFR: Estimated Creatinine Clearance: 66.1 mL/min (by C-G formula based on SCr of 1.23  mg/dL). Liver Function Tests: Recent Labs  Lab 06/08/20 0743 06/09/20 0342 06/10/20 0412 06/11/20 0516 06/12/20 0518  AST 39 29 31 22 21   ALT 27 24 26 25 26   ALKPHOS 45 48 53 54 55  BILITOT 0.3 0.4 0.6 0.5 0.4  PROT 6.9 6.6 6.8 6.4* 6.0*  ALBUMIN 3.3* 3.3* 3.5 3.2* 3.2*   No results for input(s): LIPASE, AMYLASE in the last 168 hours. No results for input(s): AMMONIA in the last 168 hours. Coagulation Profile: No results for input(s): INR, PROTIME in the last 168 hours. Cardiac Enzymes: No results for input(s): CKTOTAL, CKMB, CKMBINDEX, TROPONINI in the last 168 hours. BNP (last 3 results) No results for input(s): PROBNP in the last 8760 hours. HbA1C: No results for input(s): HGBA1C in the last 72 hours. CBG: Recent Labs  Lab 06/11/20 1125 06/11/20 1727 06/11/20 2122 06/12/20 0731 06/12/20 1111  GLUCAP 223* 179* 169* 124* 246*   Lipid Profile: No results for input(s): CHOL, HDL, LDLCALC, TRIG, CHOLHDL, LDLDIRECT in the last 72 hours. Thyroid Function Tests: No results for input(s): TSH, T4TOTAL, FREET4, T3FREE, THYROIDAB in the last 72 hours. Anemia Panel: Recent Labs    06/11/20 0516 06/12/20 0518  FERRITIN 103 97   Sepsis Labs: Recent Labs  Lab 06/07/20 0235  PROCALCITON 0.40  LATICACIDVEN 1.0    Recent Results (from the past 240 hour(s))  SARS Coronavirus 2 by RT PCR (hospital order, [facility]) Nasopharyngeal Nasopharyngeal Swab     Status: Abnormal   Collection Time: 06/06/20 11:42 PM   Specimen: Nasopharyngeal Swab  Result Value Ref Range Status   SARS Coronavirus 2 POSITIVE (A) NEGATIVE Final    Comment: RESULT CALLED TO, READ BACK BY AND VERIFIED WITH: LISA ADKINS RN @1242  06/07/2020 OLSONM (NOTE) SARS-CoV-2 target nucleic acids are DETECTED  SARS-CoV-2 RNA is generally detectable in upper respiratory specimens  during the acute phase of infection.  Positive results are indicative  of the presence of the identified  virus, but do not rule out bacterial infection or co-infection with other pathogens not detected by the test.  Clinical correlation with patient history and  other diagnostic information is necessary to determine patient infection status.  The expected result is negative.  Fact Sheet for Patients:   BoilerBrush.com.cyhttps://www.fda.gov/media/136312/download   Fact Sheet for Healthcare Providers:   https://pope.com/https://www.fda.gov/media/136313/download    This test is not yet approved or cleared by the [place] FDA and  has been authorized for detection and/or  diagnosis of SARS-CoV-2 by FDA under an Emergency Use Authorization (EUA).  This EUA will remain in effect (meaning thi s test can be used) for the duration of  the COVID-19 declaration under Section 564(b)(1) of the Act, 21 U.S.C. section 360-bbb-3(b)(1), unless the authorization is terminated or revoked sooner.  Performed at [facility], [address] [place], [place] [address]   Blood Culture (routine x 2)     Status: None   Collection Time: 06/07/20  2:35 AM   Specimen: BLOOD RIGHT ARM  Result Value Ref Range Status   Specimen Description   Final    BLOOD RIGHT ARM Performed at [facility], [address] [place], [place] [address]    Special Requests   Final    BOTTLES DRAWN AEROBIC AND ANAEROBIC Blood Culture adequate volume Performed at [facility], [address] [place], [place] [address]    Culture   Final    NO GROWTH 5 DAYS Performed at [facility], [address] [place], [place] [address]    Report Status 06/12/2020 FINAL  Final  Blood Culture (routine x 2)     Status: None   Collection Time: 06/07/20  2:40 AM   Specimen: BLOOD RIGHT HAND  Result Value Ref Range Status   Specimen Description   Final    BLOOD RIGHT HAND Performed at [facility], [address] [place], [place] [address]    Special Requests   Final    BOTTLES DRAWN AEROBIC AND ANAEROBIC Blood  Culture adequate volume Performed at [facility], [address] [place], [place] [address]    Culture   Final    NO GROWTH 5 DAYS Performed at [facility], [address], [place], [place] [address]    Report Status 06/12/2020 FINAL  Final      Radiology Studies: No results found.    Scheduled Meds: . amLODipine  10 mg Oral Q supper  . vitamin C  500 mg Oral Daily  . atorvastatin  20 mg Oral Q supper  . benztropine  0.5 mg Oral QODAY  . enoxaparin (LOVENOX) injection  40 mg Subcutaneous Q24H  . fluPHENAZine  5 mg Oral Q supper  . gabapentin  300 mg Oral Q supper  . insulin aspart  0-20 Units Subcutaneous TID WC  . insulin detemir  0.15 Units/kg Subcutaneous Daily  . linagliptin  5 mg Oral Q supper  . lisinopril  40 mg Oral Q supper  . tamsulosin  0.4 mg Oral QPC supper  . traZODone  50 mg Oral Q supper  . zinc sulfate  220 mg Oral Daily   Continuous Infusions:    LOS: 4 days    Time spent: 32 minutes spent on chart review, discussion with nursing staff, consultants, updating family and interview/physical exam; more than 50% of that time was spent in counseling and/or coordination of care.    Alvira Philips [place], DO [facility] Available via Epic secure chat 7am-7pm After these hours, please refer to coverage provider listed on [email or web address] 06/12/2020, 11:55 AM

## 2020-06-13 LAB — GLUCOSE, CAPILLARY
Glucose-Capillary: 122 mg/dL — ABNORMAL HIGH (ref 70–99)
Glucose-Capillary: 143 mg/dL — ABNORMAL HIGH (ref 70–99)
Glucose-Capillary: 116 mg/dL — ABNORMAL HIGH (ref 70–99)
Glucose-Capillary: 181 mg/dL — ABNORMAL HIGH (ref 70–99)

## 2020-06-13 NOTE — Progress Notes (Signed)
Occupational Therapy Treatment Patient Details Name: Jaime Lopez MRN: [id number] DOB: [birth year] Today's Date: 06/13/2020    History of present illness Jaime Lopez is a 69 year old male with a history of schizophrenia, diabetes, hypertension, hyperlipidemia, remote seizures 10 years ago not on AEDs who has been vaccinated against COVID 19 who was noted to have an increase in urinary incontinence, blank stares and generalized weakness x 1 week by his brother who also apparently found the patient on the floor of his home on 8/31   OT comments  Pt wiling to work with OT.  Pt with very flat affect and posterior lean in sitting and standing  Follow Up Recommendations  SNF    Equipment Recommendations  Other (comment) (defer to next venue)    Recommendations for Other Services      Precautions / Restrictions Precautions Precautions: Fall Precaution Comments: posterior lean       Mobility Bed Mobility   Bed Mobility: Sit to Supine;Supine to Sit     Supine to sit: Min assist Sit to supine: Min assist      Transfers   Equipment used: 1 person hand held assist   Sit to Stand: Min assist         General transfer comment: VC for handplacement    Balance Overall balance assessment: Needs assistance Sitting-balance support: Feet supported Sitting balance-Leahy Scale: Poor   Postural control: Posterior lean   Standing balance-Leahy Scale: Poor                             ADL either performed or assessed with clinical judgement   ADL Overall ADL's : Needs assistance/impaired     Grooming: Sitting;Oral care;Wash/dry face Grooming Details (indicate cue type and reason): sitting EOB . Pt with posterior lean                     Toileting- Clothing Manipulation and Hygiene: Moderate assistance;Sit to/from stand;Cueing for safety;Cueing for sequencing Toileting - Clothing Manipulation Details (indicate cue type and reason): sit to stand EOB- pt sat  quickly and had posterior lean in standing             Vision Patient Visual Report: No change from baseline            Cognition Arousal/Alertness: Awake/alert Behavior During Therapy: Flat affect Overall Cognitive Status: Within Functional Limits for tasks assessed                                                     Pertinent Vitals/ Pain       Pain Assessment: No/denies pain         Frequency  Min 2X/week        Progress Toward Goals  OT Goals(current goals can now be found in the care plan section)  Progress towards OT goals: Progressing toward goals     Plan Discharge plan remains appropriate       AM-PAC OT "6 Clicks" Daily Activity     Outcome Measure   Help from another person eating meals?: A Little Help from another person taking care of personal grooming?: A Little Help from another person toileting, which includes using toliet, bedpan, or urinal?: A Lot Help from another person bathing (including washing, rinsing, drying)?: A  Lot Help from another person to put on and taking off regular upper body clothing?: A Little Help from another person to put on and taking off regular lower body clothing?: A Lot 6 Click Score: 15    End of Session    OT Visit Diagnosis: Unsteadiness on feet (R26.81);Muscle weakness (generalized) (M62.81);History of falling (Z91.81)   Activity Tolerance Patient tolerated treatment well   Patient Left in bed;with bed alarm set   Nurse Communication Mobility status        Time: 0940-7680 OT Time Calculation (min): 17 min  Charges: OT General Charges $OT Visit: 1 Visit OT Treatments $Self Care/Home Management : 8-22 mins  Lise Auer, OT Acute Rehabilitation Services [phone] Office- [phone]      Azion Centrella, Karin Golden D 06/13/2020, 3:21 PM

## 2020-06-13 NOTE — Progress Notes (Signed)
PROGRESS NOTE    Jaime Lopez  DSK:[id number] DOB: May 14, 1951 DOA: 06/06/2020 PCP: Jolene Provost, MD    Brief Narrative:  Jaime Lopez is a 69 year old male with a history of schizophrenia, diabetes, hypertension, hyperlipidemia, remote seizures 10 years ago not on AEDs who has been vaccinated against COVID 19 who was noted to have an increase in urinary incontinence, blank stares and generalized weakness x 1 week by his brother who also apparently found the patient on the floor of his home on 8/31, prompting presentation to [facility]. The patient was diagnosed with COVID 19.   Currently, the patient states he recently traveled out of town and soon after began feeling ill. He has had a dry cough and generalized weakness most notable in his upper extremities. Denies chest pain, palpitations, shortness of breath, nausea, vomiting, leg swelling or any other discomfort.   In the ED, patient was initially hemodynamically stable on room air. CXR negative, CT head without acute changes but noted chronic small vessel ischemic changes. Initial labs notable for LDH 240, CRP 5.7, Lactic acid 1.0, WBC 11.4->11.6, D Dimer 2.79 -> 3.14, fibrinogen 656. He was given dexamethasone and remdesivir. Plan was to potentially discharge from [facility] ED today, however patient had an unsteady gait and desatted to 89%, prompting transfer to [facility] and further treatment for COVID.   Assessment & Plan:   Principal Problem:   Generalized weakness Active Problems:   Type 2 diabetes mellitus without complication (HCC)   Hyperlipemia   Hypertension   Schizophrenia (HCC)   Acute encephalopathy   SIRS (systemic inflammatory response syndrome) (HCC)   COVID-19 virus infection   Acute Covid-19 viral infection during the ongoing 2020 Covid 19 Pandemic - POA Patient brought to [facility] for staring spells, progressive weakness.  Has reportedly been vaccinated against Covid-19. --COVID test: PCR + 06/06/2020 --CRP  5.7>4.3>2.9>2.1>5.2 --ddimer 2.78>3.14>1.87>2.64>1.91 --Completed 5-day course of remdesivir on 06/11/2020 --Discontinued steroids on 9/4; continues to oxygenate well on room air --prone for 2-3hrs every 12hrs if able --Continue supportive care with albuterol MDI prn, vitamin C, zinc, Tylenol, antitussives (benzonatate/ Mucinex/Tussionex) --Continue airborne/contact isolation precautions for 3 weeks from the day of diagnosis  The treatment plan and use of medications and known side effects were discussed with patient/family. Some of the medications used are based on case reports/anecdotal data.  All other medications being used in the management of COVID-19 based on limited study data.  Complete risks and long-term side effects are unknown, however in the best clinical judgment they seem to be of some benefit.  Patient wanted to proceed with treatment options provided.  Acute encephalopathy, unclear etiology: Resolved Patient brother reports that patient has been experiencing "staring spells" with his "mouth open".  Initially considered absence seizure in the differential.  CT head without contrast with no acute intracranial findings.  EEG with no seizure-like/epileptiform activity.  It appears that his confusion/encephalopathy is now resolved and his mentation is at baseline.  Type 2 diabetes mellitus On Janumet 50-1000 mg p.o. twice daily at home.  Hemoglobin A1c 6.5, well controlled. --Hold home diabetic regimen while inpatient --Levemir 14 subcutaneously daily --Tradjenta 5 mg p.o. daily --Insulin sliding scale for coverage --CBGs before every meal/at bedtime  Elevated creatinine 1.36 on presentation, suspect poor oral intake/dehydration.  Last creatinine 0.9 in 2018. --Cr 1.36>1.12>1.05 --Avoid nephrotoxins, renally dose all medications --Follow BMP daily  Schizophrenia Follows with Dr. Amanda Cockayne, outpatient --Continue trazodone 50 mg p.o. nightly --Cogentin 0.5 mg every other  day --Fluphenazine 5 mg p.o. daily  HLD: Atorvastatin 20 mg p.o. daily  Essential hypertension BP 147/75 this morning. --Continue amlodipine 10 mg p.o. daily --Continue lisinopril 40 mg p.o. daily --Continue monitor blood pressure closely  Obesity Body mass index is 28.75 kg/m.  Counseled on lifestyle changes/weight loss.  Fall/gait disturbance: --PT/OT evaluation: Recommend SNF placement, plan discharged to [facility] on 06/14/2020   DVT prophylaxis: Lovenox Code Status: Full code Family Communication: Updated patient extensively at bedside  Disposition Plan:  Status is: Inpatient  Remains inpatient appropriate because:Unsafe d/c plan and IV treatments appropriate due to intensity of illness or inability to take PO likely will need SNF placement due to his gait disturbance and history of schizophrenia unable to take care for himself in which she lives alone at home.  Medically stable for discharge.  Plan discharged to [facility] on 06/14/2020   Dispo: The patient is from: Home              Anticipated d/c is to: SNF              Anticipated d/c date is: 1 day              Patient currently is medically stable to d/c.   Consultants:   None  Procedures:   EEG  Antimicrobials:   Remdesivir 8/31>>   Subjective: Patient seen and examined bedside, resting comfortably.  No complaints overnight.  Completed remdesivir 9/6.  Bed available at [facility] tomorrow.  Denies headache, no dizziness, no chest pain, palpitations, no shortness of breath, no abdominal pain.  No acute events overnight per nursing staff.  Objective: Vitals:   06/12/20 0538 06/12/20 1335 06/12/20 2119 06/13/20 0500  BP: 129/80 (!) 159/79 127/71 (!) 147/75  Pulse: 93  84 81  Resp: 18 20 19    Temp: 98.6 F (37 C) 98 F (36.7 C) 97.8 F (36.6 C) 97.9 F (36.6 C)  TempSrc: Oral Oral Oral Oral  SpO2: 93% 98% 92% 92%  Weight:      Height:        Intake/Output Summary (Last 24 hours) at  06/13/2020 1202 Last data filed at 06/13/2020 08/13/2020 Gross per 24 hour  Intake 1222 ml  Output 1600 ml  Net -378 ml   Filed Weights   06/08/20 1313  Weight: 90.9 kg    Examination:  General exam: Appears calm and comfortable  Respiratory system: Clear to auscultation. Respiratory effort normal.  Oxygenating well on room air Cardiovascular system: S1 & S2 heard, RRR. No JVD, murmurs, rubs, gallops or clicks. No pedal edema. Gastrointestinal system: Abdomen is nondistended, soft and nontender. No organomegaly or masses felt. Normal bowel sounds heard. Central nervous system: Alert and oriented to person/place/time. No focal neurological deficits. Extremities: Symmetric 5 x 5 power. Skin: No rashes, lesions or ulcers Psychiatry: Judgement and insight appear poor.  Normal mood, flat affect    Data Reviewed: I have personally reviewed following labs and imaging studies  CBC: Recent Labs  Lab 06/08/20 0743 06/08/20 0743 06/08/20 1242 06/09/20 0342 06/10/20 0412 06/11/20 0516 06/12/20 0518  WBC 11.6*   < > 13.7* 15.2* 15.4* 15.3* 10.7*  NEUTROABS 8.9*  --   --  12.9* 11.7* 12.0* 7.3  HGB 12.5*   < > 13.5 12.2* 12.7* 12.3* 11.7*  HCT 37.5*   < > 40.0 37.2* 38.3* 36.6* 36.1*  MCV 86.4   < > 86.8 87.7 87.2 87.1 88.7  PLT 350   < >  374 353 369 375 368   < > = values in this interval not displayed.   Basic Metabolic Panel: Recent Labs  Lab 06/08/20 0743 06/08/20 0743 06/08/20 1242 06/09/20 0342 06/10/20 0412 06/11/20 0516 06/12/20 0518  NA 142  --   --  141 137 136 139  K 4.0  --   --  3.5 3.7 3.4* 4.7  CL 107  --   --  108 102 101 104  CO2 26  --   --  23 23 24 26   GLUCOSE 124*  --   --  117* 105* 116* 113*  BUN 22  --   --  25* 30* 29* 34*  CREATININE 1.03   < > 0.99 1.12 1.05 1.05 1.23  CALCIUM 8.8*  --   --  8.8* 8.7* 8.8* 8.8*  MG  --   --   --  1.7 1.5* 1.8 2.3   < > = values in this interval not displayed.   GFR: Estimated Creatinine Clearance: 66.1 mL/min (by  C-G formula based on SCr of 1.23 mg/dL). Liver Function Tests: Recent Labs  Lab 06/08/20 0743 06/09/20 0342 06/10/20 0412 06/11/20 0516 06/12/20 0518  AST 39 29 31 22 21   ALT 27 24 26 25 26   ALKPHOS 45 48 53 54 55  BILITOT 0.3 0.4 0.6 0.5 0.4  PROT 6.9 6.6 6.8 6.4* 6.0*  ALBUMIN 3.3* 3.3* 3.5 3.2* 3.2*   No results for input(s): LIPASE, AMYLASE in the last 168 hours. No results for input(s): AMMONIA in the last 168 hours. Coagulation Profile: No results for input(s): INR, PROTIME in the last 168 hours. Cardiac Enzymes: No results for input(s): CKTOTAL, CKMB, CKMBINDEX, TROPONINI in the last 168 hours. BNP (last 3 results) No results for input(s): PROBNP in the last 8760 hours. HbA1C: No results for input(s): HGBA1C in the last 72 hours. CBG: Recent Labs  Lab 06/12/20 1111 06/12/20 1716 06/12/20 2122 06/13/20 0759 06/13/20 1141  GLUCAP 246* 117* 188* 122* 143*   Lipid Profile: No results for input(s): CHOL, HDL, LDLCALC, TRIG, CHOLHDL, LDLDIRECT in the last 72 hours. Thyroid Function Tests: No results for input(s): TSH, T4TOTAL, FREET4, T3FREE, THYROIDAB in the last 72 hours. Anemia Panel: Recent Labs    06/11/20 0516 06/12/20 0518  FERRITIN 103 97   Sepsis Labs: Recent Labs  Lab 06/07/20 0235  PROCALCITON 0.40  LATICACIDVEN 1.0    Recent Results (from the past 240 hour(s))  SARS Coronavirus 2 by RT PCR (hospital order, [facility]) Nasopharyngeal Nasopharyngeal Swab     Status: Abnormal   Collection Time: 06/06/20 11:42 PM   Specimen: Nasopharyngeal Swab  Result Value Ref Range Status   SARS Coronavirus 2 POSITIVE (A) NEGATIVE Final    Comment: RESULT CALLED TO, READ BACK BY AND VERIFIED WITH: LISA ADKINS RN @1242  06/07/2020 OLSONM (NOTE) SARS-CoV-2 target nucleic acids are DETECTED  SARS-CoV-2 RNA is generally detectable in upper respiratory specimens  during the acute phase of infection.  Positive results are indicative    of the presence of the identified virus, but do not rule out bacterial infection or co-infection with other pathogens not detected by the test.  Clinical correlation with patient history and  other diagnostic information is necessary to determine patient infection status.  The expected result is negative.  Fact Sheet for Patients:   BoilerBrush.com.cyhttps://www.fda.gov/media/136312/download   Fact Sheet for Healthcare Providers:   https://pope.com/https://www.fda.gov/media/136313/download    This test is not yet approved or cleared by  the [employer] and  has been authorized for detection and/or diagnosis of SARS-CoV-2 by FDA under an Emergency Use Authorization (EUA).  This EUA will remain in effect (meaning thi s test can be used) for the duration of  the COVID-19 declaration under Section 564(b)(1) of the Act, 21 U.S.C. section 360-bbb-3(b)(1), unless the authorization is terminated or revoked sooner.  Performed at [facility], [address] [place], [place] [address]   Blood Culture (routine x 2)     Status: None   Collection Time: 06/07/20  2:35 AM   Specimen: BLOOD RIGHT ARM  Result Value Ref Range Status   Specimen Description   Final    BLOOD RIGHT ARM Performed at [facility], [address] [place], [place] [address]    Special Requests   Final    BOTTLES DRAWN AEROBIC AND ANAEROBIC Blood Culture adequate volume Performed at [facility], [address] [place], [place] [address]    Culture   Final    NO GROWTH 5 DAYS Performed at [facility], [address] [place], [place] [address]    Report Status 06/12/2020 FINAL  Final  Blood Culture (routine x 2)     Status: None   Collection Time: 06/07/20  2:40 AM   Specimen: BLOOD RIGHT HAND  Result Value Ref Range Status   Specimen Description   Final    BLOOD RIGHT HAND Performed at [facility], [address] [place], [place] [address]    Special Requests   Final    BOTTLES  DRAWN AEROBIC AND ANAEROBIC Blood Culture adequate volume Performed at [facility], [address] [place], [place] [address]    Culture   Final    NO GROWTH 5 DAYS Performed at [facility], [address], [place], [place] [address]    Report Status 06/12/2020 FINAL  Final      Radiology Studies: No results found.    Scheduled Meds: . amLODipine  10 mg Oral Q supper  . vitamin C  500 mg Oral Daily  . atorvastatin  20 mg Oral Q supper  . benztropine  0.5 mg Oral QODAY  . fluPHENAZine  5 mg Oral Q supper  . gabapentin  300 mg Oral Q supper  . insulin aspart  0-20 Units Subcutaneous TID WC  . insulin detemir  0.15 Units/kg Subcutaneous Daily  . linagliptin  5 mg Oral Q supper  . lisinopril  40 mg Oral Q supper  . tamsulosin  0.4 mg Oral QPC supper  . traZODone  50 mg Oral Q supper  . zinc sulfate  220 mg Oral Daily   Continuous Infusions:    LOS: 5 days    Time spent: 32 minutes spent on chart review, discussion with nursing staff, consultants, updating family and interview/physical exam; more than 50% of that time was spent in counseling and/or coordination of care.    Alvira Philips [place], DO [facility] Available via Epic secure chat 7am-7pm After these hours, please refer to coverage provider listed on [email or web address] 06/13/2020, 12:02 PM

## 2020-06-14 LAB — GLUCOSE, CAPILLARY: Glucose-Capillary: 120 mg/dL — ABNORMAL HIGH (ref 70–99)

## 2020-06-14 MED ORDER — GUAIFENESIN-DM 100-10 MG/5ML PO SYRP: 5.0000 mL | ORAL_SOLUTION | ORAL | 0 refills | Status: DC | PRN

## 2020-06-14 MED ORDER — ACETAMINOPHEN 325 MG PO TABS: 650.0000 mg | ORAL_TABLET | Freq: Four times a day (QID) | ORAL | Status: DC | PRN

## 2020-06-14 MED ORDER — POLYETHYLENE GLYCOL 3350 17 G PO PACK: 17.0000 g | PACK | Freq: Every day | ORAL | 0 refills | Status: DC | PRN

## 2020-06-14 NOTE — Discharge Summary (Signed)
DISCHARGE SUMMARY  Jaime Lopez  MR#: [id number]  DOB:1950/11/21  Date of Admission: 06/06/2020 Date of Discharge: 06/14/2020  Attending Physician:Brentton Wardlow Silvestre Gunner, MD  Patient's BTD:[name], Jaime Irani, MD  Disposition: D/C to SNF   Date of Positive COVID Test: 8/31  Date Quarantine Ends: 9/21  COVID-19 specific Treatment: Remdesivir Steroids  Follow-up Appts:  Contact information for after-discharge care    Destination    [facility] .   Service: Skilled Nursing Contact information: [address] [place] [address] [phone]                  Tests Needing Follow-up: -recheck renal function in 5-7 days -follow BP and DM control -follow mental status   Discharge Diagnoses: Acute Covid-19 viral infection  Acute encephalopathy, unclear etiology Type 2 diabetes mellitus Elevated creatinine - acute kidney injury  Schizophrenia HLD Essential hypertension Fall/gait disturbance  Initial presentation: 69 year old male with a history of schizophrenia, diabetes, hypertension, hyperlipidemia, remote seizures 10 years ago not on AEDs who has been vaccinated against COVID 19 who was noted to have an increase in urinary incontinence, blank stares and generalized weakness x 1 week by his brother who also apparently found the patient on the floor of his home on 8/31, prompting presentation to [facility]. The patient was diagnosed with COVID 19.   The patient stated he traveled out of town and soon after began feeling ill. He had a dry cough and generalized weakness most notable in his upper extremities.   In the ED, patient was initially hemodynamically stable on room air. CXR negative, CT head without acute changes but noted chronic small vessel ischemic changes. Initial labs notable for LDH 240, CRP 5.7, Lactic acid 1.0, WBC 11.4->11.6, D Dimer 2.79 -> 3.14, fibrinogen 656. He was given dexamethasone and remdesivir. Plan was to  potentially discharge from [facility] ED, however patient had an unsteady gait and desatted to 89%, prompting transfer to [facility] and further treatment for COVID.  Hospital Course:  Acute Covid-19 viral infection POA Patient presented to [facility] for staring spells, progressive weakness.  Has reportedly been vaccinated against Covid-19. --COVID test: PCR + 06/06/2020 --CRP 5.7>4.3>2.9>2.1>5.2 --ddimer 2.78>3.14>1.87>2.64>1.91 --Completed 5-day course of remdesivir on 06/11/2020 --Discontinued steroids on 9/4; continues to oxygenate well on room air --prone for 2-3hrs every 12hrs if able --Continue airborne/contact isolation precautions for 3 weeks from the day of diagnosis  Acute encephalopathy, unclear etiology: Resolved Patient brother reported that patient had been experiencing "staring spells" with his "mouth open".  Initially considered absence seizure in the differential.  CT head without contrast with no acute intracranial findings.  EEG with no seizure-like/epileptiform activity.  It appears that his confusion/encephalopathy is now resolved and his mentation is at baseline.  Type 2 diabetes mellitus On Janumet 50-1000 mg p.o. twice daily at home.  Hemoglobin A1c 6.5, well controlled. --Held home diabetic regimen while inpatient --Levemir 14 subcutaneously daily --Tradjenta 5 mg p.o. daily --Insulin sliding scale for coverage --CBGs before every meal/at bedtime -resume usual tx regimen at time of d/c   Elevated creatinine - acute kidney injury  1.36 on presentation, suspect poor oral intake/dehydration.  Last creatinine 0.9 in 2018. --Cr 1.36>1.12>1.05 --Avoided nephrotoxins  Schizophrenia Follows with Dr. Amanda Cockayne, outpatient --Continue trazodone  --Cogentin  --Fluphenazine   HLD --Cont Atorvastatin   Essential hypertension --Continue amlodipine  --Continue lisinopril  --Continue monitor blood pressure closely  Overweight Body mass index is 28.75 kg/m.   Counseled on lifestyle  changes/weight loss.  Fall/gait disturbance: --PT/OT evaluated: Recommend SNF placement, plan discharged to [facility] on 06/14/2020    Allergies as of 06/14/2020   No Known Allergies     Medication List    TAKE these medications   acetaminophen 325 MG tablet Commonly known as: TYLENOL Take 2 tablets (650 mg total) by mouth every 6 (six) hours as needed for mild pain or headache (fever >/= 101).   amLODipine 10 MG tablet Commonly known as: NORVASC Take 10 mg by mouth daily with supper.   atorvastatin 20 MG tablet Commonly known as: LIPITOR Take 20 mg by mouth daily with supper.   benztropine 0.5 MG tablet Commonly known as: COGENTIN Take 0.5 mg by mouth every other day. Takes with supper.   fluPHENAZine 5 MG tablet Commonly known as: PROLIXIN Take 5 mg by mouth daily with supper.   gabapentin 300 MG capsule Commonly known as: NEURONTIN Take 300 mg by mouth daily with supper.   guaiFENesin-dextromethorphan 100-10 MG/5ML syrup Commonly known as: ROBITUSSIN DM Take 5 mLs by mouth every 4 (four) hours as needed for cough.   lisinopril 40 MG tablet Commonly known as: ZESTRIL Take 40 mg by mouth daily with supper.   polyethylene glycol 17 g packet Commonly known as: MIRALAX / GLYCOLAX Take 17 g by mouth daily as needed for mild constipation.   sitaGLIPtin-metformin 50-1000 MG tablet Commonly known as: JANUMET Take 1 tablet by mouth 2 (two) times daily with a meal.   tamsulosin 0.4 MG Caps capsule Commonly known as: FLOMAX Take 0.4 mg by mouth daily after supper.   traZODone 50 MG tablet Commonly known as: DESYREL Take 50 mg by mouth daily with supper.       Day of Discharge BP 121/70 (BP Location: Right Arm)   Pulse 69   Temp 98.1 F (36.7 C) (Oral)   Resp 17   Ht 5\' 10"  (1.778 m)   Wt 90.9 kg   SpO2 95%   BMI 28.75 kg/m   Physical Exam: General: No acute respiratory distress Lungs: Clear to auscultation bilaterally  without wheezes or crackles Cardiovascular: Regular rate and rhythm without murmur gallop or rub normal S1 and S2 Abdomen: Nontender, nondistended, soft, bowel sounds positive, no rebound, no ascites, no appreciable mass Extremities: No significant cyanosis, clubbing, or edema bilateral lower extremities  Basic Metabolic Panel: Recent Labs  Lab 06/08/20 0743 06/08/20 0743 06/08/20 1242 06/09/20 0342 06/10/20 0412 06/11/20 0516 06/12/20 0518  NA 142  --   --  141 137 136 139  K 4.0  --   --  3.5 3.7 3.4* 4.7  CL 107  --   --  108 102 101 104  CO2 26  --   --  23 23 24 26   GLUCOSE 124*  --   --  117* 105* 116* 113*  BUN 22  --   --  25* 30* 29* 34*  CREATININE 1.03   < > 0.99 1.12 1.05 1.05 1.23  CALCIUM 8.8*  --   --  8.8* 8.7* 8.8* 8.8*  MG  --   --   --  1.7 1.5* 1.8 2.3   < > = values in this interval not displayed.    Liver Function Tests: Recent Labs  Lab 06/08/20 0743 06/09/20 0342 06/10/20 0412 06/11/20 0516 06/12/20 0518  AST 39 29 31 22 21   ALT 27 24 26 25 26   ALKPHOS 45 48 53 54 55  BILITOT 0.3 0.4 0.6 0.5 0.4  PROT 6.9 6.6 6.8 6.4* 6.0*  ALBUMIN 3.3* 3.3* 3.5 3.2* 3.2*    CBC: Recent Labs  Lab 06/08/20 0743 06/08/20 0743 06/08/20 1242 06/09/20 0342 06/10/20 0412 06/11/20 0516 06/12/20 0518  WBC 11.6*   < > 13.7* 15.2* 15.4* 15.3* 10.7*  NEUTROABS 8.9*  --   --  12.9* 11.7* 12.0* 7.3  HGB 12.5*   < > 13.5 12.2* 12.7* 12.3* 11.7*  HCT 37.5*   < > 40.0 37.2* 38.3* 36.6* 36.1*  MCV 86.4   < > 86.8 87.7 87.2 87.1 88.7  PLT 350   < > 374 353 369 375 368   < > = values in this interval not displayed.    CBG: Recent Labs  Lab 06/13/20 0759 06/13/20 1141 06/13/20 1545 06/13/20 2042 06/14/20 0822  GLUCAP 122* 143* 116* 181* 120*    Recent Results (from the past 240 hour(s))  SARS Coronavirus 2 by RT PCR (hospital order, performed in [facility] hospital lab) Nasopharyngeal Nasopharyngeal Swab     Status: Abnormal   Collection Time: 06/06/20  11:42 PM   Specimen: Nasopharyngeal Swab  Result Value Ref Range Status   SARS Coronavirus 2 POSITIVE (A) NEGATIVE Final    Comment: RESULT CALLED TO, READ BACK BY AND VERIFIED WITH: LISA ADKINS RN @1242  06/07/2020 OLSONM (NOTE) SARS-CoV-2 target nucleic acids are DETECTED  SARS-CoV-2 RNA is generally detectable in upper respiratory specimens  during the acute phase of infection.  Positive results are indicative  of the presence of the identified virus, but do not rule out bacterial infection or co-infection with other pathogens not detected by the test.  Clinical correlation with patient history and  other diagnostic information is necessary to determine patient infection status.  The expected result is negative.  Fact Sheet for Patients:   08/07/2020   Fact Sheet for Healthcare Providers:   BoilerBrush.com.cy    This test is not yet approved or cleared by the https://pope.com/ FDA and  has been authorized for detection and/or diagnosis of SARS-CoV-2 by FDA under an Emergency Use Authorization (EUA).  This EUA will remain in effect (meaning thi s test can be used) for the duration of  the COVID-19 declaration under Section 564(b)(1) of the Act, 21 U.S.C. section 360-bbb-3(b)(1), unless the authorization is terminated or revoked sooner.  Performed at [facility], [address] [place], [place]   Blood Culture (routine x 2)     Status: None   Collection Time: 06/07/20  2:35 AM   Specimen: BLOOD RIGHT ARM  Result Value Ref Range Status   Specimen Description   Final    BLOOD RIGHT ARM Performed at [facility], [address] [place], [place]    Special Requests   Final    BOTTLES DRAWN AEROBIC AND ANAEROBIC Blood Culture adequate volume Performed at [facility], [address] [place], [place]    Culture   Final    NO GROWTH 5 DAYS Performed at [facility], [address], [place], [place]    Report Status 06/12/2020 FINAL  Final  Blood Culture (routine x 2)     Status: None   Collection Time: 06/07/20  2:40 AM   Specimen: BLOOD RIGHT HAND  Result Value Ref Range Status   Specimen Description   Final    BLOOD RIGHT HAND Performed at [facility], [address] [place], [place]  [id number]    Special Requests   Final    BOTTLES DRAWN AEROBIC AND ANAEROBIC Blood Culture adequate volume Performed at [facility], [address] [place], [place] [id number]    Culture   Final    NO GROWTH 5 DAYS Performed at [facility], [address] [place], [place] [id number]    Report Status 06/12/2020 FINAL  Final      Time spent in discharge (includes decision making & examination of pt): 35 minutes  06/14/2020, 10:33 AM   Lonia BloodJeffrey T. Deloras Reichard, MD [facility] Office  [phone]

## 2020-06-14 NOTE — Discharge Instructions (Signed)
Date of Positive COVID Test: 8/31  Date Quarantine Ends: 9/21    COVID-19 COVID-19 is a respiratory infection that is caused by a virus called severe acute respiratory syndrome coronavirus 2 (SARS-CoV-2). The disease is also known as coronavirus disease or novel coronavirus. In some people, the virus may not cause any symptoms. In others, it may cause a serious infection. The infection can get worse quickly and can lead to complications, such as:  Pneumonia, or infection of the lungs.  Acute respiratory distress syndrome or ARDS. This is a condition in which fluid build-up in the lungs prevents the lungs from filling with air and passing oxygen into the blood.  Acute respiratory failure. This is a condition in which there is not enough oxygen passing from the lungs to the body or when carbon dioxide is not passing from the lungs out of the body.  Sepsis or septic shock. This is a serious bodily reaction to an infection.  Blood clotting problems.  Secondary infections due to bacteria or fungus.  Organ failure. This is when your body's organs stop working. The virus that causes COVID-19 is contagious. This means that it can spread from person to person through droplets from coughs and sneezes (respiratory secretions). What are the causes? This illness is caused by a virus. You may catch the virus by:  Breathing in droplets from an infected person. Droplets can be spread by a person breathing, speaking, singing, coughing, or sneezing.  Touching something, like a table or a doorknob, that was exposed to the virus (contaminated) and then touching your mouth, nose, or eyes. What increases the risk? Risk for infection You are more likely to be infected with this virus if you:  Are within 6 feet (2 meters) of a person with COVID-19.  Provide care for or live with a person who is infected with COVID-19.  Spend time in crowded indoor spaces or live in shared housing. Risk for serious  illness You are more likely to become seriously ill from the virus if you:  Are 52 years of age or older. The higher your age, the more you are at risk for serious illness.  Live in a nursing home or long-term care facility.  Have cancer.  Have a long-term (chronic) disease such as: ? Chronic lung disease, including chronic obstructive pulmonary disease or asthma. ? A long-term disease that lowers your body's ability to fight infection (immunocompromised). ? Heart disease, including heart failure, a condition in which the arteries that lead to the heart become narrow or blocked (coronary artery disease), a disease which makes the heart muscle thick, weak, or stiff (cardiomyopathy). ? Diabetes. ? Chronic kidney disease. ? Sickle cell disease, a condition in which red blood cells have an abnormal "sickle" shape. ? Liver disease.  Are obese. What are the signs or symptoms? Symptoms of this condition can range from mild to severe. Symptoms may appear any time from 2 to 14 days after being exposed to the virus. They include:  A fever or chills.  A cough.  Difficulty breathing.  Headaches, body aches, or muscle aches.  Runny or stuffy (congested) nose.  A sore throat.  New loss of taste or smell. Some people may also have stomach problems, such as nausea, vomiting, or diarrhea. Other people may not have any symptoms of COVID-19. How is this diagnosed? This condition may be diagnosed based on:  Your signs and symptoms, especially if: ? You live in an area with a COVID-19 outbreak. ? You  recently traveled to or from an area where the virus is common. ? You provide care for or live with a person who was diagnosed with COVID-19. ? You were exposed to a person who was diagnosed with COVID-19.  A physical exam.  Lab tests, which may include: ? Taking a sample of fluid from the back of your nose and throat (nasopharyngeal fluid), your nose, or your throat using a swab. ? A sample  of mucus from your lungs (sputum). ? Blood tests.  Imaging tests, which may include, X-rays, CT scan, or ultrasound. How is this treated? At present, there is no medicine to treat COVID-19. Medicines that treat other diseases are being used on a trial basis to see if they are effective against COVID-19. Your health care provider will talk with you about ways to treat your symptoms. For most people, the infection is mild and can be managed at home with rest, fluids, and over-the-counter medicines. Treatment for a serious infection usually takes places in a hospital intensive care unit (ICU). It may include one or more of the following treatments. These treatments are given until your symptoms improve.  Receiving fluids and medicines through an IV.  Supplemental oxygen. Extra oxygen is given through a tube in the nose, a face mask, or a hood.  Positioning you to lie on your stomach (prone position). This makes it easier for oxygen to get into the lungs.  Continuous positive airway pressure (CPAP) or bi-level positive airway pressure (BPAP) machine. This treatment uses mild air pressure to keep the airways open. A tube that is connected to a motor delivers oxygen to the body.  Ventilator. This treatment moves air into and out of the lungs by using a tube that is placed in your windpipe.  Tracheostomy. This is a procedure to create a hole in the neck so that a breathing tube can be inserted.  Extracorporeal membrane oxygenation (ECMO). This procedure gives the lungs a chance to recover by taking over the functions of the heart and lungs. It supplies oxygen to the body and removes carbon dioxide. Follow these instructions at home: Lifestyle  If you are sick, stay home except to get medical care. Your health care provider will tell you how long to stay home. Call your health care provider before you go for medical care.  Rest at home as told by your health care provider.  Do not use any  products that contain nicotine or tobacco, such as cigarettes, e-cigarettes, and chewing tobacco. If you need help quitting, ask your health care provider.  Return to your normal activities as told by your health care provider. Ask your health care provider what activities are safe for you. General instructions  Take over-the-counter and prescription medicines only as told by your health care provider.  Drink enough fluid to keep your urine pale yellow.  Keep all follow-up visits as told by your health care provider. This is important. How is this prevented?  There is no vaccine to help prevent COVID-19 infection. However, there are steps you can take to protect yourself and others from this virus. To protect yourself:   Do not travel to areas where COVID-19 is a risk. The areas where COVID-19 is reported change often. To identify high-risk areas and travel restrictions, check the CDC travel website: StageSync.si  If you live in, or must travel to, an area where COVID-19 is a risk, take precautions to avoid infection. ? Stay away from people who are sick. ? Wash  your hands often with soap and water for 20 seconds. If soap and water are not available, use an alcohol-based hand sanitizer. ? Avoid touching your mouth, face, eyes, or nose. ? Avoid going out in public, follow guidance from your state and local health authorities. ? If you must go out in public, wear a cloth face covering or face mask. Make sure your mask covers your nose and mouth. ? Avoid crowded indoor spaces. Stay at least 6 feet (2 meters) away from others. ? Disinfect objects and surfaces that are frequently touched every day. This may include:  Counters and tables.  Doorknobs and light switches.  Sinks and faucets.  Electronics, such as phones, remote controls, keyboards, computers, and tablets. To protect others: If you have symptoms of COVID-19, take steps to prevent the virus from spreading to  others.  If you think you have a COVID-19 infection, contact your health care provider right away. Tell your health care team that you think you may have a COVID-19 infection.  Stay home. Leave your house only to seek medical care. Do not use public transport.  Do not travel while you are sick.  Wash your hands often with soap and water for 20 seconds. If soap and water are not available, use alcohol-based hand sanitizer.  Stay away from other members of your household. Let healthy household members care for children and pets, if possible. If you have to care for children or pets, wash your hands often and wear a mask. If possible, stay in your own room, separate from others. Use a different bathroom.  Make sure that all people in your household wash their hands well and often.  Cough or sneeze into a tissue or your sleeve or elbow. Do not cough or sneeze into your hand or into the air.  Wear a cloth face covering or face mask. Make sure your mask covers your nose and mouth. Where to find more information  Centers for Disease Control and Prevention: StickerEmporium.tn  World Health Organization: https://thompson-craig.com/ Contact a health care provider if:  You live in or have traveled to an area where COVID-19 is a risk and you have symptoms of the infection.  You have had contact with someone who has COVID-19 and you have symptoms of the infection. Get help right away if:  You have trouble breathing.  You have pain or pressure in your chest.  You have confusion.  You have bluish lips and fingernails.  You have difficulty waking from sleep.  You have symptoms that get worse. These symptoms may represent a serious problem that is an emergency. Do not wait to see if the symptoms will go away. Get medical help right away. Call your local emergency services (911 in the [place] Do not drive yourself to the hospital. Let the emergency medical  personnel know if you think you have COVID-19. Summary  COVID-19 is a respiratory infection that is caused by a virus. It is also known as coronavirus disease or novel coronavirus. It can cause serious infections, such as pneumonia, acute respiratory distress syndrome, acute respiratory failure, or sepsis.  The virus that causes COVID-19 is contagious. This means that it can spread from person to person through droplets from breathing, speaking, singing, coughing, or sneezing.  You are more likely to develop a serious illness if you are 37 years of age or older, have a weak immune system, live in a nursing home, or have chronic disease.  There is no medicine to treat COVID-19.  Your health care provider will talk with you about ways to treat your symptoms.  Take steps to protect yourself and others from infection. Wash your hands often and disinfect objects and surfaces that are frequently touched every day. Stay away from people who are sick and wear a mask if you are sick. This information is not intended to replace advice given to you by your health care provider. Make sure you discuss any questions you have with your health care provider. Document Revised: 07/23/2019 Document Reviewed: 10/29/2018 Elsevier Patient Education  [employer].

## 2020-06-14 NOTE — Care Management Important Message (Signed)
Important Message  Patient Details IM Letter given to the Patient Name: Jaime Lopez MRN: [id number] Date of Birth: 08-Sep-1951   Medicare Important Message Given:  Yes     Caren Macadam 06/14/2020, 11:39 AM

## 2020-06-14 NOTE — TOC Transition Note (Signed)
Transition of Care [facility]) - CM/SW Discharge Note   Patient Details  Name: Jaime Lopez MRN: [id number] Date of Birth: [birth year]  Transition of Care [facility]) CM/SW Contact:  Ida Rogue, LCSW Phone Number: 06/14/2020, 10:36 AM   Clinical Narrative:   Patient to transfer to [facility] today.  Family alerted. PTAR arranged.  Nursing, please call report to [phone], room 805A. TOC sign off.    Final next level of care: Skilled Nursing Facility Barriers to Discharge: Barriers Resolved   Patient Goals and CMS Choice Patient states their goals for this hospitalization and ongoing recovery are:: "I don't think I am strong enough to go home."   Choice offered to / list presented to : Sibling, Patient  Discharge Placement                       Discharge Plan and Services   Discharge Planning Services: CM Consult Post Acute Care Choice: Skilled Nursing Facility                               Social Determinants of Health (SDOH) Interventions     Readmission Risk Interventions No flowsheet data found.

## 2020-06-14 NOTE — Progress Notes (Signed)
Patient discharged to [facility]. Report called to Reuel Boom RN at [facility].

## 2020-11-10 IMAGING — CT CT HEAD W/O CM
3 of 4 series · 16 of 47 positions shown, 19 images · non-contrast
Comparison: 11/16/2016

CLINICAL DATA: Weakness, urinary incontinence, found down

EXAM:
CT HEAD WITHOUT CONTRAST
TECHNIQUE: Contiguous axial images were obtained from the base of the skull
through the vertex without intravenous contrast.

[Series 2: head wo · axial · 0.47mm/px · z∈[+1005,+1155]mm · 10 of 36 slices shown, 13 images]
[im 3/36  brain]
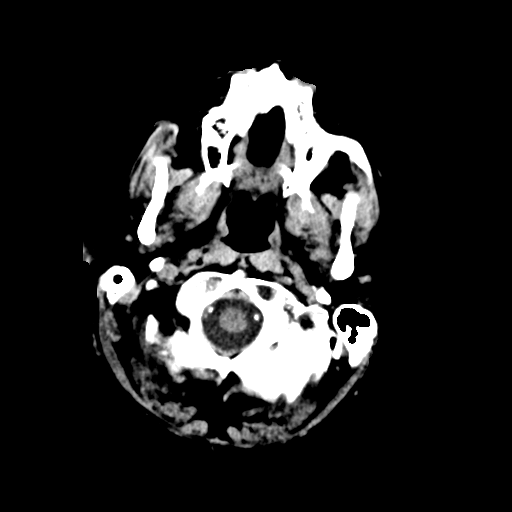
[im 3/36  bone]
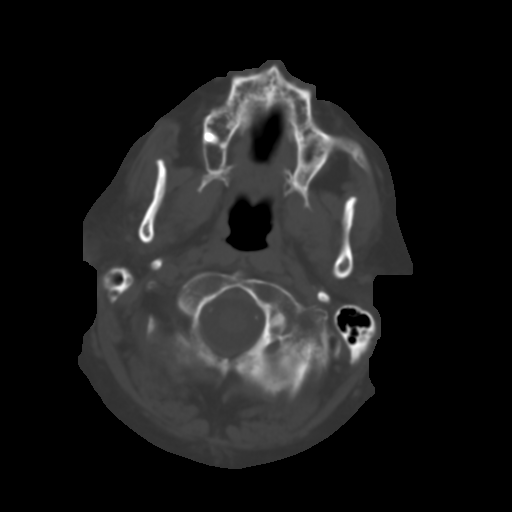
[im 6/36  brain]
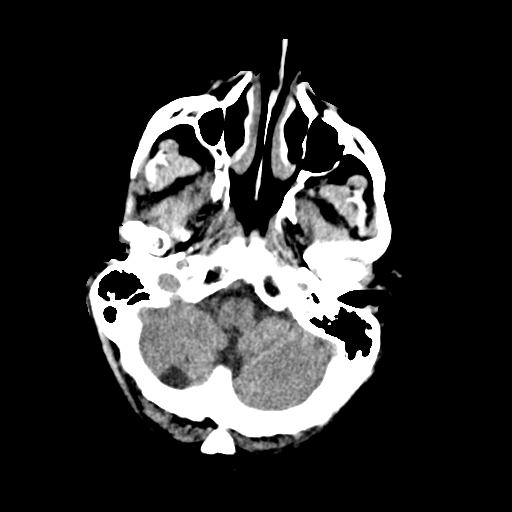
[im 11/36  brain]
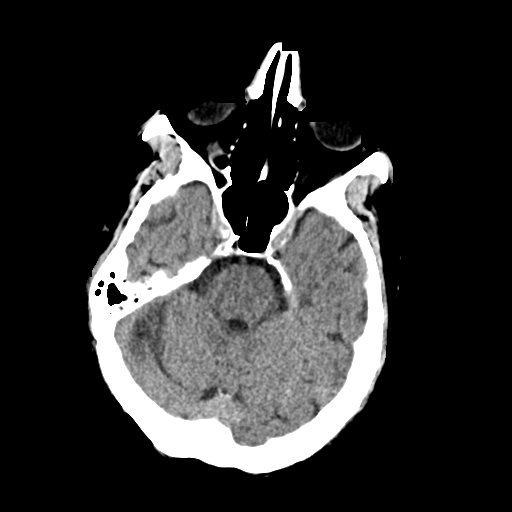
[im 13/36  brain]
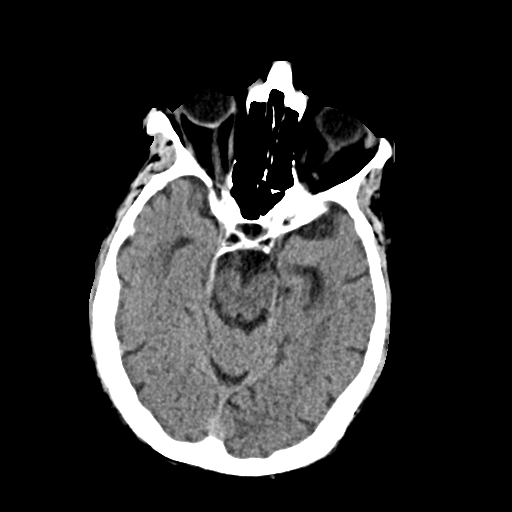
[im 16/36  brain]
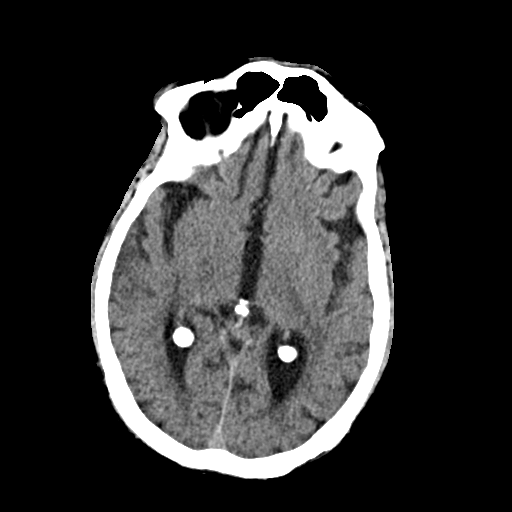
[im 16/36  bone]
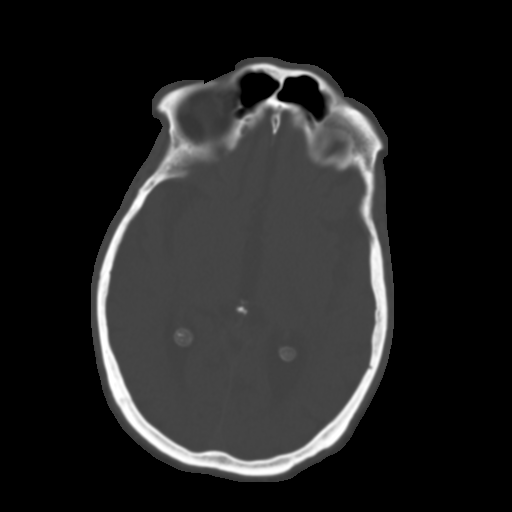
[im 21/36  brain]
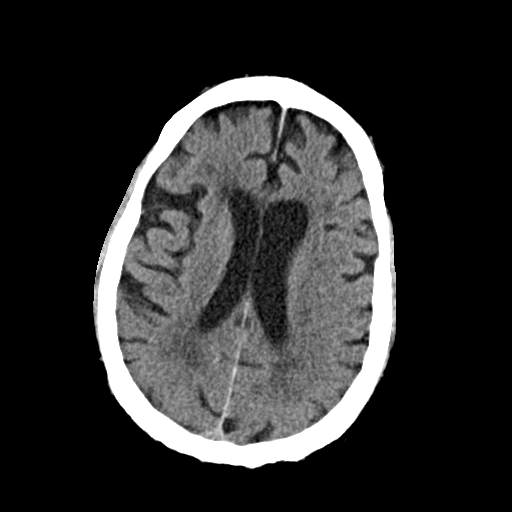
[im 23/36  brain]
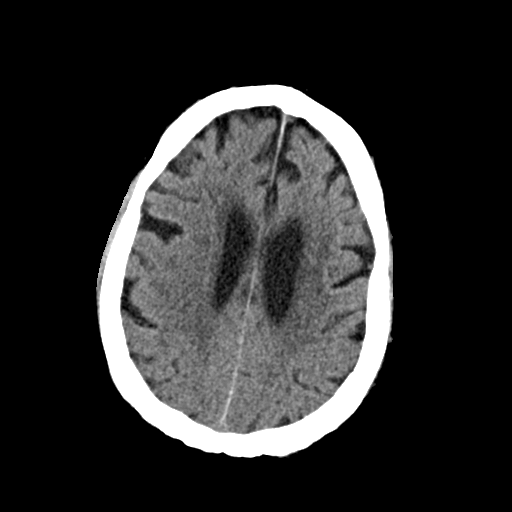
[im 26/36  brain]
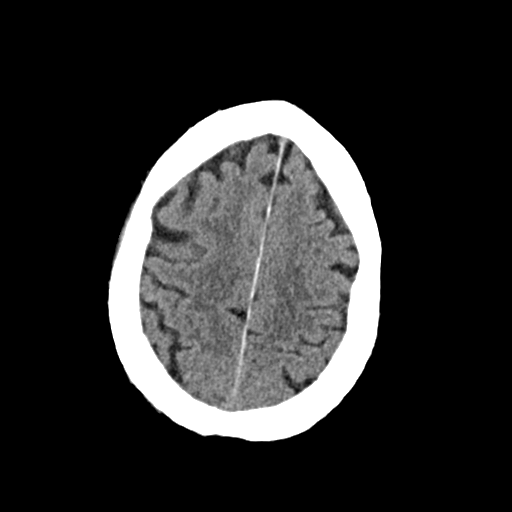
[im 31/36  brain]
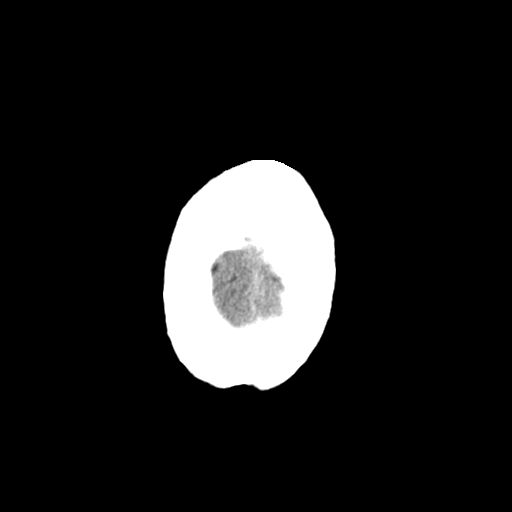
[im 31/36  bone]
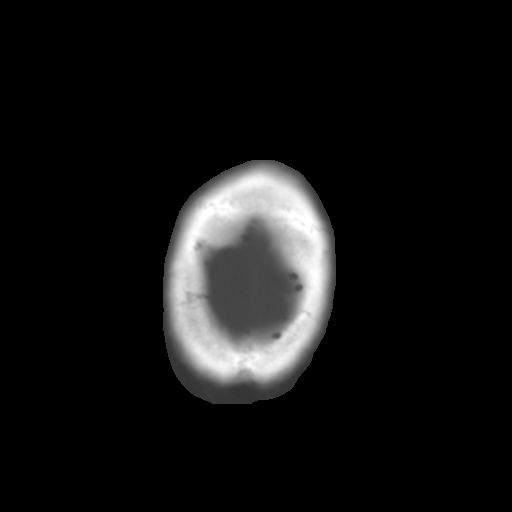
[im 33/36  brain]
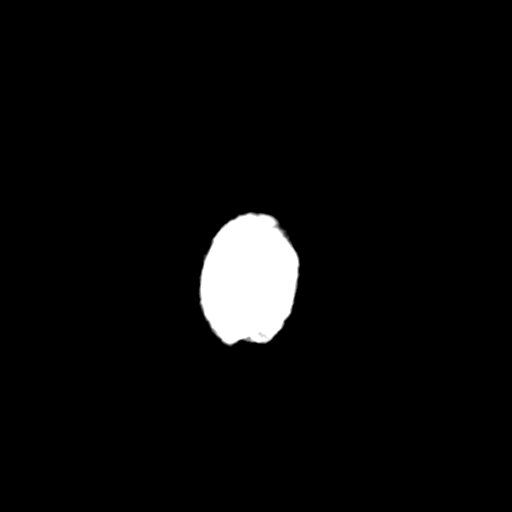

[Series 4: coronal soft · coronal · 0.36mm/px · 3 of 69 slices shown]
[im 26/69  brain]
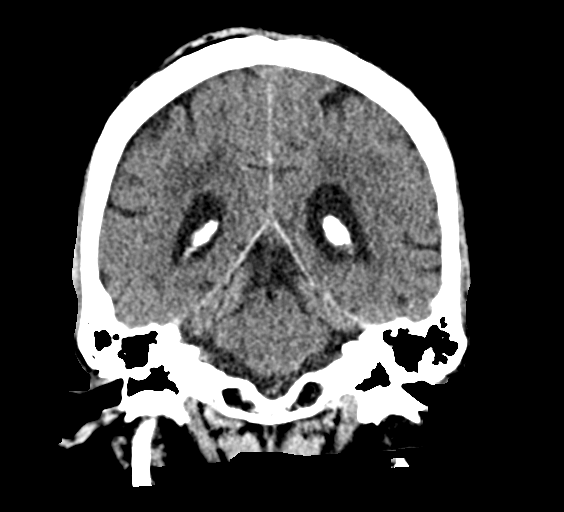
[im 32/69  brain]
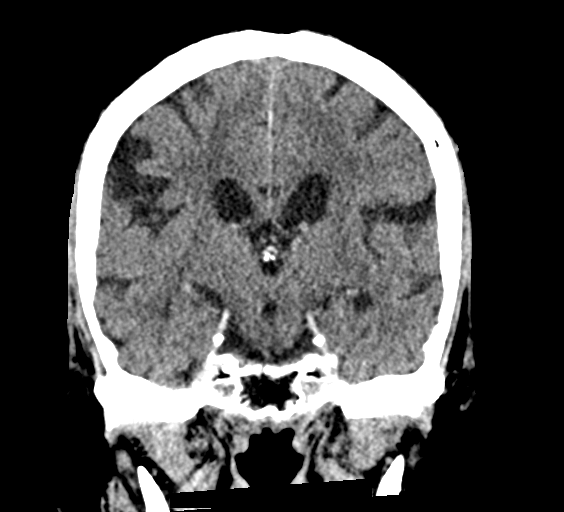
[im 38/69  brain]
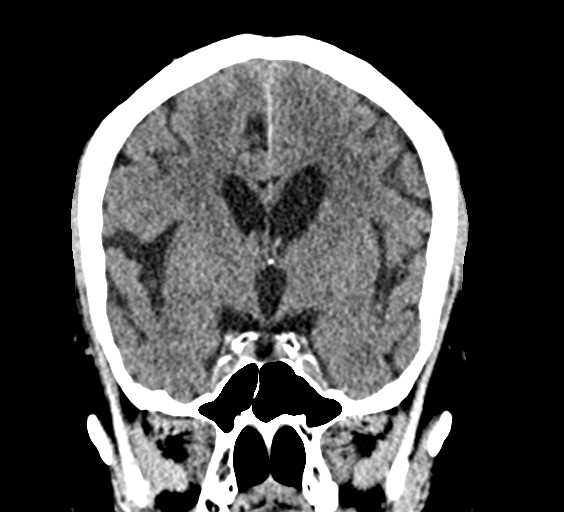

[Series 5: sag soft · sagittal · 0.36mm/px · 3 of 56 slices shown]
[im 19/56  brain]
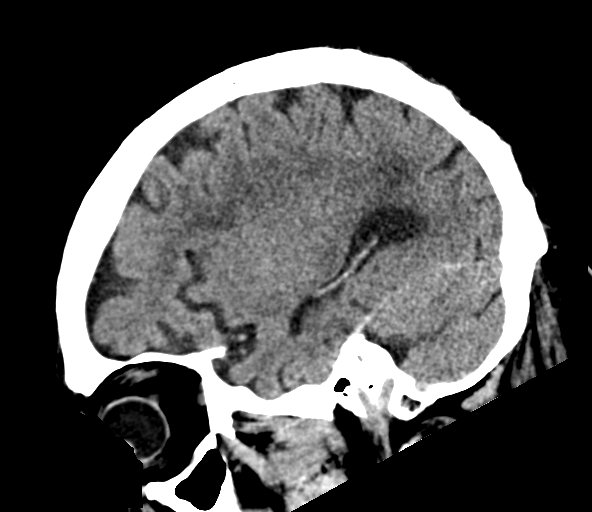
[im 28/56  brain]
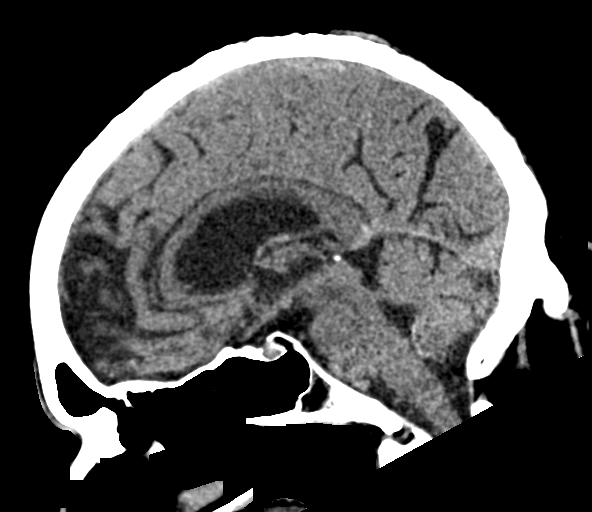
[im 37/56  brain]
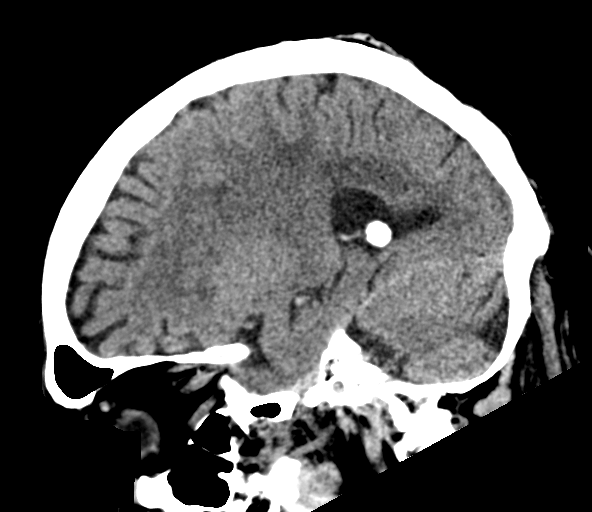

[16 of 47 positions shown; findings below may reference images not displayed]

FINDINGS: Brain: Scattered hypodensities throughout the periventricular white
matter consistent with chronic small vessel ischemic changes. No
signs of acute infarct or hemorrhage. The lateral ventricles and
midline structures are unremarkable. No acute extra-axial fluid
collections. No mass effect.

Vascular: No hyperdense vessel or unexpected calcification.

Skull: Normal. Negative for fracture or focal lesion.

Sinuses/Orbits: No acute finding.

Other: None.
IMPRESSION: 1. Chronic small-vessel ischemic changes throughout the white
matter. No acute intracranial process.

## 2022-07-11 ENCOUNTER — Emergency Department (HOSPITAL_COMMUNITY)
Admission: EM | Admit: 2022-07-11 | Discharge: 2022-07-12 | Disposition: A | Discharge status: Skilled Nursing Facility | Payer: Medicare Other | Attending: Emergency Medicine | Admitting: Emergency Medicine

## 2022-07-11 ENCOUNTER — Emergency Department (HOSPITAL_COMMUNITY): Payer: Medicare Other

## 2022-07-11 ENCOUNTER — Other Ambulatory Visit: Payer: Self-pay

## 2022-07-11 ENCOUNTER — Encounter (HOSPITAL_COMMUNITY): Payer: Self-pay | Admitting: Emergency Medicine

## 2022-07-11 DIAGNOSIS — Y92129 Unspecified place in nursing home as the place of occurrence of the external cause: Secondary | ICD-10-CM | POA: Insufficient documentation

## 2022-07-11 DIAGNOSIS — M25512 Pain in left shoulder: Secondary | ICD-10-CM | POA: Diagnosis not present

## 2022-07-11 DIAGNOSIS — W010XXA Fall on same level from slipping, tripping and stumbling without subsequent striking against object, initial encounter: Secondary | ICD-10-CM | POA: Insufficient documentation

## 2022-07-11 DIAGNOSIS — S4992XA Unspecified injury of left shoulder and upper arm, initial encounter: Secondary | ICD-10-CM | POA: Diagnosis present

## 2022-07-11 DIAGNOSIS — S40012A Contusion of left shoulder, initial encounter: Secondary | ICD-10-CM | POA: Insufficient documentation

## 2022-07-11 NOTE — ED Notes (Signed)
Pt noted eating crackers. This Probation officer asked if he was told he could eat by someone and he said "no".

## 2022-07-11 NOTE — ED Provider Triage Note (Signed)
Emergency Medicine Provider Triage Evaluation Note  Jaime Lopez , a 71 y.o. male  was evaluated in triage.  Pt complains of ground-level fall.  Patient being brought in from [place].  The patient was walking to the cafeteria when he had a ground-level fall falling into his left side.  The patient now complaining of left shoulder pain.  Patient denies any pain in his left elbow, wrist, knee, hip, ankle.  The patient denies any chest wall pain.  The patient denies being on blood thinners or hitting his head.  Review of Systems  Positive:  Negative:   Physical Exam  BP (!) 141/70 (BP Location: Left Arm)   Pulse (!) 102   Temp 97.9 F (36.6 C) (Oral)   Resp 15   SpO2 100%  Gen:   Awake, no distress   Resp:  Normal effort  MSK:   Moves extremities without difficulty  Other:  Full range of motion of left shoulder.  No overlying skin change.  No deformity.  Medical Decision Making  Medically screening exam initiated at 2:45 PM.  Appropriate orders placed.  Jaime Lopez was informed that the remainder of the evaluation will be completed by another provider, this initial triage assessment does not replace that evaluation, and the importance of remaining in the ED until their evaluation is complete.     Azucena Cecil, PA-C 07/11/22 1446

## 2022-07-11 NOTE — ED Triage Notes (Signed)
Pt arrives via EMS from [place] in [place]. Pt tripped and fell on his way to the cafeteria. Pt complains of pain in left and right shoulder. NP 158/78, HR 108, resp 18, CBG 157. Did not hit head, no LOC. No blood thinners

## 2022-07-12 NOTE — ED Notes (Signed)
Report called to [place] at [facility] in [facility]

## 2022-07-12 NOTE — Progress Notes (Signed)
Orthopedic Tech Progress Note Patient Details:  Jaime Lopez 1951/01/07 [id number]  Sling was applied to pt with the shoulder immobilizer part being cut off. MD clarified they wanted a sling and not the immobilizer part. Pt displayed some confusion while the ortho techs were in the room so the ortho tech signed for the sling. Overall pt tolerated the application well.   Ortho Devices Type of Ortho Device: Shoulder immobilizer Ortho Device/Splint Location: LUE Ortho Device/Splint Interventions: Ordered, Application, Adjustment   Post Interventions Patient Tolerated: Well Instructions Provided: Adjustment of device, Care of device  Arville Go 07/12/2022, 6:09 AM

## 2022-07-12 NOTE — ED Provider Notes (Signed)
[facility] EMERGENCY DEPARTMENT Provider Note   CSN: [id number] Arrival date & time: 07/11/22  1311     History  Chief Complaint  Patient presents with   Jaime Lopez is a 71 y.o. male.  Patient presented to the emergency department for evaluation of a shoulder injury that occurred when he fell.  Patient reports that he walks with a walker, tripped over his walker causing the fall.  Patient complaining of pain "in the ball" of his left shoulder.  No head injury or loss of consciousness.  No other complaints or injury.       Home Medications Prior to Admission medications   Medication Sig Start Date End Date Taking? Authorizing Provider  acetaminophen (TYLENOL) 325 MG tablet Take 2 tablets (650 mg total) by mouth every 6 (six) hours as needed for mild pain or headache (fever >/= 101). 06/14/20   Cherene Altes, MD  amLODipine (NORVASC) 10 MG tablet Take 10 mg by mouth daily with supper.     [provider]  atorvastatin (LIPITOR) 20 MG tablet Take 20 mg by mouth daily with supper.     [provider]  benztropine (COGENTIN) 0.5 MG tablet Take 0.5 mg by mouth every other day. Takes with supper. 05/26/20   [provider]  fluPHENAZine (PROLIXIN) 5 MG tablet Take 5 mg by mouth daily with supper.     [provider]  gabapentin (NEURONTIN) 300 MG capsule Take 300 mg by mouth daily with supper.     [provider]  guaiFENesin-dextromethorphan (ROBITUSSIN DM) 100-10 MG/5ML syrup Take 5 mLs by mouth every 4 (four) hours as needed for cough. 06/14/20   Cherene Altes, MD  lisinopril (PRINIVIL,ZESTRIL) 40 MG tablet Take 40 mg by mouth daily with supper.     [provider]  polyethylene glycol (MIRALAX / GLYCOLAX) 17 g packet Take 17 g by mouth daily as needed for mild constipation. 06/14/20   Cherene Altes, MD  sitaGLIPtin-metformin (JANUMET) 50-1000 MG tablet Take 1 tablet by mouth 2 (two) times daily  with a meal.    [provider]  tamsulosin (FLOMAX) 0.4 MG CAPS capsule Take 0.4 mg by mouth daily after supper.  03/24/20   [provider]  traZODone (DESYREL) 50 MG tablet Take 50 mg by mouth daily with supper.  06/01/20   [provider]      Allergies    Patient has no known allergies.    Review of Systems   Review of Systems  Physical Exam Updated Vital Signs BP 126/74   Pulse 78   Temp 97.9 F (36.6 C) (Oral)   Resp 18   SpO2 98%  Physical Exam Vitals and nursing note reviewed.  Constitutional:      General: He is not in acute distress.    Appearance: He is well-developed.  HENT:     Head: Normocephalic and atraumatic.     Mouth/Throat:     Mouth: Mucous membranes are moist.  Eyes:     General: Vision grossly intact. Gaze aligned appropriately.     Extraocular Movements: Extraocular movements intact.     Conjunctiva/sclera: Conjunctivae normal.  Cardiovascular:     Rate and Rhythm: Normal rate and regular rhythm.     Pulses: Normal pulses.     Heart sounds: Normal heart sounds, S1 normal and S2 normal. No murmur heard.    No friction rub. No gallop.  Pulmonary:     Effort:  Pulmonary effort is normal. No respiratory distress.     Breath sounds: Normal breath sounds.  Abdominal:     Palpations: Abdomen is soft.     Tenderness: There is no abdominal tenderness. There is no guarding or rebound.     Hernia: No hernia is present.  Musculoskeletal:        General: No swelling.     Left shoulder: Tenderness (Anterior) present. No swelling or deformity. Decreased range of motion (Pain).     Cervical back: Full passive range of motion without pain, normal range of motion and neck supple. No pain with movement, spinous process tenderness or muscular tenderness. Normal range of motion.     Right lower leg: No edema.     Left lower leg: No edema.  Skin:    General: Skin is warm and dry.     Capillary Refill: Capillary refill takes less than 2  seconds.     Findings: No ecchymosis, erythema, lesion or wound.  Neurological:     Mental Status: He is alert and oriented to person, place, and time.     GCS: GCS eye subscore is 4. GCS verbal subscore is 5. GCS motor subscore is 6.     Cranial Nerves: Cranial nerves 2-12 are intact.     Sensory: Sensation is intact.     Motor: Motor function is intact. No weakness or abnormal muscle tone.     Coordination: Coordination is intact.  Psychiatric:        Mood and Affect: Mood normal.        Speech: Speech normal.        Behavior: Behavior normal.     ED Results / Procedures / Treatments   Labs (all labs ordered are listed, but only abnormal results are displayed) Labs Reviewed - No data to display  EKG EKG Interpretation  Date/Time:  Friday July 12 2022 02:53:38 EDT Ventricular Rate:  78 PR Interval:  170 QRS Duration: 144 QT Interval:  408 QTC Calculation: 465 R Axis:   89 Text Interpretation: Normal sinus rhythm Right bundle branch block Abnormal ECG When compared with ECG of 07-Jun-2020 02:30, PREVIOUS ECG IS PRESENT Confirmed by Orpah Greek ([id number]) on 07/12/2022 3:18:48 AM  Radiology DG Shoulder Left  Result Date: 07/11/2022 CLINICAL DATA:  [place].  Left shoulder pain. EXAM: LEFT SHOULDER - 2+ VIEW COMPARISON:  None Available. FINDINGS: Advanced AC joint degenerative changes. Minimal glenohumeral joint degenerative changes. No acute fracture. The visualized left ribs are intact. Remote healed fractures are noted. IMPRESSION: 1. No acute bony findings. 2. Advanced AC joint degenerative changes. Electronically Signed   By: Marijo Sanes M.D.   On: 07/11/2022 15:22    Procedures Procedures    Medications Ordered in ED Medications - No data to display  ED Course/ Medical Decision Making/ A&P                           Medical Decision Making  Presents to the emergency department for evaluation of shoulder pain after a fall.  Diabetes does not reveal any  other evidence of injury.  X-ray negative.        Final Clinical Impression(s) / ED Diagnoses Final diagnoses:  Contusion of left shoulder, initial encounter    Rx / DC Orders ED Discharge Orders     None         Denee Boeder, Gwenyth Allegra, MD 07/13/22 [phone]

## 2022-09-28 ENCOUNTER — Emergency Department (HOSPITAL_COMMUNITY): Payer: Medicare Other

## 2022-09-28 ENCOUNTER — Emergency Department (HOSPITAL_COMMUNITY)
Admission: EM | Admit: 2022-09-28 | Discharge: 2022-09-28 | Disposition: A | Discharge status: Home / Self Care | Payer: Medicare Other | Attending: Emergency Medicine | Admitting: Emergency Medicine

## 2022-09-28 ENCOUNTER — Other Ambulatory Visit: Payer: Self-pay

## 2022-09-28 DIAGNOSIS — W19XXXA Unspecified fall, initial encounter: Secondary | ICD-10-CM

## 2022-09-28 DIAGNOSIS — J0100 Acute maxillary sinusitis, unspecified: Secondary | ICD-10-CM | POA: Diagnosis not present

## 2022-09-28 DIAGNOSIS — S0990XA Unspecified injury of head, initial encounter: Secondary | ICD-10-CM | POA: Diagnosis present

## 2022-09-28 DIAGNOSIS — I10 Essential (primary) hypertension: Secondary | ICD-10-CM | POA: Insufficient documentation

## 2022-09-28 DIAGNOSIS — Z7984 Long term (current) use of oral hypoglycemic drugs: Secondary | ICD-10-CM | POA: Insufficient documentation

## 2022-09-28 DIAGNOSIS — S065X0A Traumatic subdural hemorrhage without loss of consciousness, initial encounter: Secondary | ICD-10-CM | POA: Insufficient documentation

## 2022-09-28 DIAGNOSIS — W1830XA Fall on same level, unspecified, initial encounter: Secondary | ICD-10-CM | POA: Diagnosis not present

## 2022-09-28 DIAGNOSIS — Z79899 Other long term (current) drug therapy: Secondary | ICD-10-CM | POA: Insufficient documentation

## 2022-09-28 DIAGNOSIS — S065XAA Traumatic subdural hemorrhage with loss of consciousness status unknown, initial encounter: Secondary | ICD-10-CM

## 2022-09-28 DIAGNOSIS — E119 Type 2 diabetes mellitus without complications: Secondary | ICD-10-CM | POA: Diagnosis not present

## 2022-09-28 DIAGNOSIS — S0083XA Contusion of other part of head, initial encounter: Secondary | ICD-10-CM

## 2022-09-28 LAB — BASIC METABOLIC PANEL
Anion gap: 11 (ref 5–15)
BUN: 19 mg/dL (ref 8–23)
CO2: 23 mmol/L (ref 22–32)
Calcium: 9.5 mg/dL (ref 8.9–10.3)
Chloride: 106 mmol/L (ref 98–111)
Creatinine, Ser: 1.04 mg/dL (ref 0.61–1.24)
GFR, Estimated: >60 mL/min (ref >60)
Glucose, Bld: 160 mg/dL — ABNORMAL HIGH (ref 70–99)
Potassium: 4.3 mmol/L (ref 3.5–5.1)
Sodium: 140 mmol/L (ref 135–145)

## 2022-09-28 LAB — CBC WITH DIFFERENTIAL/PLATELET
Abs Immature Granulocytes: 0.17 10*3/uL — ABNORMAL HIGH (ref 0.00–0.07)
Basophils Absolute: 0.1 10*3/uL (ref 0.0–0.1)
Basophils Relative: 0 %
Eosinophils Absolute: 0.2 10*3/uL (ref 0.0–0.5)
Eosinophils Relative: 1 %
HCT: 40.2 % (ref 39.0–52.0)
Hemoglobin: 13 g/dL (ref 13.0–17.0)
Immature Granulocytes: 1 %
Lymphocytes Relative: 8 %
Lymphs Abs: 1.3 10*3/uL (ref 0.7–4.0)
MCH: 28.6 pg (ref 26.0–34.0)
MCHC: 32.3 g/dL (ref 30.0–36.0)
MCV: 88.5 fL (ref 80.0–100.0)
Monocytes Absolute: 1.1 10*3/uL — ABNORMAL HIGH (ref 0.1–1.0)
Monocytes Relative: 7 %
Neutro Abs: 13.8 10*3/uL — ABNORMAL HIGH (ref 1.7–7.7)
Neutrophils Relative %: 83 %
Platelets: 329 10*3/uL (ref 150–400)
RBC: 4.54 MIL/uL (ref 4.22–5.81)
RDW: 14.3 % (ref 11.5–15.5)
WBC: 16.6 10*3/uL — ABNORMAL HIGH (ref 4.0–10.5)
nRBC: 0 % (ref 0.0–0.2)

## 2022-09-28 LAB — PROTIME-INR
INR: 1 (ref 0.8–1.2)
Prothrombin Time: 13 seconds (ref 11.4–15.2)

## 2022-09-28 MED ORDER — ACETAMINOPHEN 325 MG PO TABS
650.0000 mg | ORAL_TABLET | Freq: Once | ORAL | Status: AC
  Administered 2022-09-28: 650 mg via ORAL
  Filled 2022-09-28: qty 2

## 2022-09-28 MED ORDER — PROCHLORPERAZINE EDISYLATE 10 MG/2ML IJ SOLN
10.0000 mg | Freq: Once | INTRAMUSCULAR | Status: AC
  Administered 2022-09-28: 10 mg via INTRAVENOUS
  Filled 2022-09-28: qty 2

## 2022-09-28 MED ORDER — DIPHENHYDRAMINE HCL 50 MG/ML IJ SOLN
12.5000 mg | Freq: Once | INTRAMUSCULAR | Status: AC
  Administered 2022-09-28: 12.5 mg via INTRAVENOUS
  Filled 2022-09-28: qty 1

## 2022-09-28 NOTE — ED Provider Notes (Signed)
Patient signed out to me at 3 PM.  He is awaiting repeat head CT to reevaluate subdural.  He has had multiple falls recently.  He is not on any blood thinners.  No aspirin.  He had a CT scan today that showed acute left cerebral convex subdural hematoma measuring up to 5 mm in thickness without mass effect or midline shift.  Dr. Jordan Likes with neurosurgery was consulted and is recommending repeat head CT at the 6-hour mark to see if there is any major change.  Patient really does not have any symptoms.  He is well-appearing.  If CT scan is stable can be discharged to home per neurosurgery team.  Repeat CT scan per radiology report shows no major interval worsening of subdural hematoma.  Patient asymptomatic upon my reevaluation.  Patient cleared by neurosurgery as there has been no change in subdural.  He understands return precautions.  Discharged in good condition.  This chart was dictated using voice recognition software.  Despite best efforts to proofread,  errors can occur which can change the documentation meaning.    Virgina Norfolk, DO 09/28/22 2229

## 2022-09-28 NOTE — Discharge Instructions (Addendum)
You are seen in the emergency department for evaluation of injuries from a fall.  The CAT scan of your head showed a subdural hematoma which is some bleeding inside the brain.  Neurosurgery Dr. Dutch Quint recommended a repeat CAT scan in 6 hours to make sure that you are bleeding is not progressing.  You can use Tylenol for pain.  Follow-up with neurosurgery as outpatient.  Return to the emergency department if any worsening or concerning symptoms.

## 2022-09-28 NOTE — ED Provider Notes (Signed)
[facility]   CSN: [id number] Arrival date & time: 09/28/22  1247     History  Chief Complaint  Patient presents with   Jaime JonesFall    Jaime Lopez is a 71 y.o. male.  He has a history of schizophrenia low back pain diabetes hypertension and general weakness.  He is a resident at [facility].  He reportedly have a fall.  Patient states he has had multiple falls recently.  He denies any loss of consciousness.  He is complaining of a mild headache.  He says he got tripped up with his walker.  He does not appear to be on any blood thinners.  He denies other complaints.  The history is provided by the patient.  Fall This is a recurrent problem. The problem has been resolved. Associated symptoms include headaches. Pertinent negatives include no chest pain, no abdominal pain and no shortness of breath. Nothing aggravates the symptoms. Nothing relieves the symptoms. He has tried nothing for the symptoms. The treatment provided no relief.       Home Medications Prior to Admission medications   Medication Sig Start Date End Date Taking? Authorizing Provider  acetaminophen (TYLENOL) 325 MG tablet Take 2 tablets (650 mg total) by mouth every 6 (six) hours as needed for mild pain or headache (fever >/= 101). 06/14/20   Lonia BloodMcClung, Jeffrey T, MD  amLODipine (NORVASC) 10 MG tablet Take 10 mg by mouth daily with supper.     [provider]  atorvastatin (LIPITOR) 20 MG tablet Take 20 mg by mouth daily with supper.     [provider]  benztropine (COGENTIN) 0.5 MG tablet Take 0.5 mg by mouth every other day. Takes with supper. 05/26/20   [provider]  fluPHENAZine (PROLIXIN) 5 MG tablet Take 5 mg by mouth daily with supper.     [provider]  gabapentin (NEURONTIN) 300 MG capsule Take 300 mg by mouth daily with supper.     [provider]  guaiFENesin-dextromethorphan (ROBITUSSIN DM) 100-10 MG/5ML  syrup Take 5 mLs by mouth every 4 (four) hours as needed for cough. 06/14/20   Lonia BloodMcClung, Jeffrey T, MD  lisinopril (PRINIVIL,ZESTRIL) 40 MG tablet Take 40 mg by mouth daily with supper.     [provider]  polyethylene glycol (MIRALAX / GLYCOLAX) 17 g packet Take 17 g by mouth daily as needed for mild constipation. 06/14/20   Lonia BloodMcClung, Jeffrey T, MD  sitaGLIPtin-metformin (JANUMET) 50-1000 MG tablet Take 1 tablet by mouth 2 (two) times daily with a meal.    [provider]  tamsulosin (FLOMAX) 0.4 MG CAPS capsule Take 0.4 mg by mouth daily after supper.  03/24/20   [provider]  traZODone (DESYREL) 50 MG tablet Take 50 mg by mouth daily with supper.  06/01/20   [provider]      Allergies    Patient has no known allergies.    Review of Systems   Review of Systems  Constitutional:  Negative for fever.  Respiratory:  Negative for shortness of breath.   Cardiovascular:  Negative for chest pain.  Gastrointestinal:  Negative for abdominal pain.  Musculoskeletal:  Negative for neck pain.  Skin:  Positive for wound.  Neurological:  Positive for headaches.    Physical Exam Updated Vital Signs BP 134/74 (BP Location: Left Arm)   Pulse 99   Temp 98.2 F (36.8 C) (Oral)   Resp 18   Ht 5\' 10"  (1.778 m)  Wt 90 kg   SpO2 98%   BMI 28.47 kg/m  Physical Exam Vitals and nursing note reviewed.  Constitutional:      General: He is not in acute distress.    Appearance: Normal appearance. He is well-developed.  HENT:     Head: Normocephalic.     Comments: He has an abrasion on his forehead and over his nose.  Nose is slightly swollen.  He has some dried blood around his lip. Eyes:     Conjunctiva/sclera: Conjunctivae normal.  Cardiovascular:     Rate and Rhythm: Normal rate and regular rhythm.     Heart sounds: No murmur heard. Pulmonary:     Effort: Pulmonary effort is normal. No respiratory distress.     Breath sounds: Normal breath sounds.   Abdominal:     Palpations: Abdomen is soft.     Tenderness: There is no abdominal tenderness.  Musculoskeletal:        General: No tenderness or deformity. Normal range of motion.     Cervical back: Neck supple.     Comments: He has some abrasions over his left wrist  Skin:    General: Skin is warm and dry.     Capillary Refill: Capillary refill takes less than 2 seconds.  Neurological:     General: No focal deficit present.     Mental Status: He is alert and oriented to person, place, and time.     Sensory: No sensory deficit.     Motor: No weakness.     ED Results / Procedures / Treatments   Labs (all labs ordered are listed, but only abnormal results are displayed) Labs Reviewed  BASIC METABOLIC PANEL - Abnormal; Notable for the following components:      Result Value   Glucose, Bld 160 (*)    All other components within normal limits  CBC WITH DIFFERENTIAL/PLATELET - Abnormal; Notable for the following components:   WBC 16.6 (*)    Neutro Abs 13.8 (*)    Monocytes Absolute 1.1 (*)    Abs Immature Granulocytes 0.17 (*)    All other components within normal limits  PROTIME-INR  URINALYSIS, ROUTINE W REFLEX MICROSCOPIC    EKG None  Radiology CT Head Wo Contrast  Result Date: 09/28/2022 CLINICAL DATA:  Fall, hit head and left side of face. EXAM: CT HEAD WITHOUT CONTRAST CT MAXILLOFACIAL WITHOUT CONTRAST TECHNIQUE: Multidetector CT imaging of the head and maxillofacial structures were performed using the standard protocol without intravenous contrast. Multiplanar CT image reconstructions of the maxillofacial structures were also generated. RADIATION DOSE REDUCTION: This exam was performed according to the departmental dose-optimization program which includes automated exposure control, adjustment of the mA and/or kV according to patient size and/or use of iterative reconstruction technique. COMPARISON:  CT head 06/06/2020, maxillofacial CT 11/16/2016. FINDINGS: CT HEAD  FINDINGS Brain: There is a 5 mm thick acute subdural hematoma overlying the left cerebral convexity, thickest over the frontal lobe. There is a 4 mm thick component layering along the left anterior aspect of the falx. There is no mass effect on the underlying brain parenchyma or midline shift. There is no other acute intracranial hemorrhage. There is no evidence of acute infarct. Background parenchymal volume is within expected limits for age. The ventricles are normal in size. 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 There is no solid mass lesion. Vascular: No hyperdense vessel or unexpected calcification. Skull: Normal. Negative for fracture or  focal lesion. Other: None. CT MAXILLOFACIAL FINDINGS Osseous: There is no acute facial bone fracture. There is no evidence of mandibular dislocation. There is no suspicious osseous lesion. Orbits: The globes and orbits are unremarkable. There is no retrobulbar hematoma. Sinuses: There is layering frothy secretions in the right maxillary sinus. Soft tissues: Unremarkable. IMPRESSION: 1. Acute left cerebral convexity subdural hematoma measuring up to 5 mm in thickness without mass effect or midline shift. 2. No acute facial bone fracture. 3. Layering frothy secretions in the right maxillary sinus may reflect acute sinusitis in the correct clinical setting. Critical Value/emergent results were called by telephone at the time of interpretation on 09/28/2022 at 1:39 pm to provider [facility] , who verbally acknowledged these results. Electronically Signed   By: Lesia Hausen M.D.   On: 09/28/2022 13:42   CT Maxillofacial Wo Contrast  Result Date: 09/28/2022 CLINICAL DATA:  Fall, hit head and left side of face. EXAM: CT HEAD WITHOUT CONTRAST CT MAXILLOFACIAL WITHOUT CONTRAST TECHNIQUE: Multidetector CT imaging of the head and maxillofacial structures were performed using the standard  protocol without intravenous contrast. Multiplanar CT image reconstructions of the maxillofacial structures were also generated. RADIATION DOSE REDUCTION: This exam was performed according to the departmental dose-optimization program which includes automated exposure control, adjustment of the mA and/or kV according to patient size and/or use of iterative reconstruction technique. COMPARISON:  CT head 06/06/2020, maxillofacial CT 11/16/2016. FINDINGS: CT HEAD FINDINGS Brain: There is a 5 mm thick acute subdural hematoma overlying the left cerebral convexity, thickest over the frontal lobe. There is a 4 mm thick component layering along the left anterior aspect of the falx. There is no mass effect on the underlying brain parenchyma or midline shift. There is no other acute intracranial hemorrhage. There is no evidence of acute infarct. Background parenchymal volume is within expected limits for age. The ventricles are normal in size. 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 There is no solid mass lesion. Vascular: No hyperdense vessel or unexpected calcification. Skull: Normal. Negative for fracture or focal lesion. Other: None. CT MAXILLOFACIAL FINDINGS Osseous: There is no acute facial bone fracture. There is no evidence of mandibular dislocation. There is no suspicious osseous lesion. Orbits: The globes and orbits are unremarkable. There is no retrobulbar hematoma. Sinuses: There is layering frothy secretions in the right maxillary sinus. Soft tissues: Unremarkable. IMPRESSION: 1. Acute left cerebral convexity subdural hematoma measuring up to 5 mm in thickness without mass effect or midline shift. 2. No acute facial bone fracture. 3. Layering frothy secretions in the right maxillary sinus may reflect acute sinusitis in the correct clinical setting. Critical Value/emergent results were called by telephone at the time of  interpretation on 09/28/2022 at 1:39 pm to provider [facility] , who verbally acknowledged these results. Electronically Signed   By: Lesia Hausen M.D.   On: 09/28/2022 13:42    Procedures Procedures    Medications Ordered in ED Medications - No data to display  ED Course/ Medical Decision Making/ A&P Clinical Course as of 09/28/22 1803  Sat Sep 28, 2022  1458 Discussed with Dr. Jordan Likes neurosurgery.  He is recommending a repeat CAT scan in 6 hours and if this is unchanged he can be discharged to follow-up outpatient. [MB]    Clinical Course User Index [MB] Terrilee Files, MD  Medical Decision Making Amount and/or Complexity of Data Reviewed Labs: ordered. Radiology: ordered.  Risk OTC drugs. Prescription drug management.   This patient complains of fall head injury; this involves an extensive number of treatment Options and is a complaint that carries with it a high risk of complications and morbidity. The differential includes fracture, contusion, intracerebral bleed, dehydration  I ordered, reviewed and interpreted labs, which included CBC with elevated white count normal hemoglobin, chemistries normal other than elevated glucose, INR normal I ordered medication oral Tylenol and reviewed PMP when indicated. I ordered imaging studies which included CT head and max face and I independently    visualized and interpreted imaging which showed acute subdural Additional history obtained from patient's brother Previous records obtained and reviewed in epic no recent admissions I consulted Dr. Jordan Likes neurosurgery and discussed lab and imaging findings and discussed disposition.  Cardiac monitoring reviewed, normal sinus rhythm Social determinants considered, no significant barriers Critical Interventions: None  After the interventions stated above, I reevaluated the patient and found patient to be awake alert in no distress Admission and further  testing considered, patient will need a period of observation and repeat head CT.  His care is signed out to Dr. Lockie Mola to follow-up on results of CT.  If CT not progressing and patient otherwise asymptomatic can be discharged to follow-up outpatient with neurosurgery.         Final Clinical Impression(s) / ED Diagnoses Final diagnoses:  SDH (subdural hematoma) (HCC)  Acute non-recurrent maxillary sinusitis  Contusion of face, initial encounter  Fall, initial encounter    Rx / DC Orders ED Discharge Orders     None         Terrilee Files, MD 09/28/22 1805

## 2022-09-28 NOTE — ED Provider Triage Note (Signed)
Emergency Medicine Provider Triage Evaluation Note  Ulis Kaps , a 71 y.o. male  was evaluated in triage.  Pt complains of mechanical fall.  States he was walking just prior to arrival with his walker when he tripped over the walker and fell forward.  States he did hit his head, but did not lose consciousness.  Has no headache, vision changes or any other symptoms at this time.  Does not know if he takes any blood thinners.  Chart review shows that he does not take any blood thinners.  No history of A-fib or DVT/PE.  States he did bite his lip when he fell, has dried blood around his lips.  No active bleeding.  Has abrasions to his nose and forehead which she states is from his fall 1 week ago.  States he has been falling approximately once per week for the last 6 months.  All of these are mechanical.  Has not had any prodromes.  Review of Systems  Positive: As above Negative: As above  Physical Exam  BP 134/74 (BP Location: Left Arm)   Pulse 99   Temp 98.2 F (36.8 C) (Oral)   Resp 18   Ht 5\' 10"  (1.778 m)   Wt 90 kg   SpO2 98%   BMI 28.47 kg/m  Gen:   Awake, no distress   Resp:  Normal effort  MSK:   Moves extremities without difficulty  Other:  Abrasions to bridge of nose and forehead without active bleeding.  Dried blood on the lips  Medical Decision Making  Medically screening exam initiated at 1:05 PM.  Appropriate orders placed.  Farooq Petrovich was informed that the remainder of the evaluation will be completed by another provider, this initial triage assessment does not replace that evaluation, and the importance of remaining in the ED until their evaluation is complete.     Delfin Edis, Michelle Piper 09/28/22 1307

## 2022-09-28 NOTE — ED Triage Notes (Signed)
Pt BIB EMS from [place] after mechanical fall. Pt states he was walking with walker and fell. Abrasions to face. A&ox4, denies loc, not on blood thinners

## 2022-09-28 NOTE — ED Notes (Addendum)
Safe transport called for pt, report given to [employer].

## 2022-10-06 ENCOUNTER — Emergency Department (HOSPITAL_COMMUNITY): Payer: Medicare Other

## 2022-10-06 ENCOUNTER — Encounter (HOSPITAL_COMMUNITY): Payer: Self-pay | Admitting: Emergency Medicine

## 2022-10-06 ENCOUNTER — Other Ambulatory Visit: Payer: Self-pay

## 2022-10-06 ENCOUNTER — Emergency Department (HOSPITAL_COMMUNITY)
Admission: EM | Admit: 2022-10-06 | Discharge: 2022-10-06 | Disposition: A | Discharge status: Home / Self Care | Payer: Medicare Other | Attending: Emergency Medicine | Admitting: Emergency Medicine

## 2022-10-06 DIAGNOSIS — Z79899 Other long term (current) drug therapy: Secondary | ICD-10-CM | POA: Diagnosis not present

## 2022-10-06 DIAGNOSIS — Z7984 Long term (current) use of oral hypoglycemic drugs: Secondary | ICD-10-CM | POA: Insufficient documentation

## 2022-10-06 DIAGNOSIS — S8012XA Contusion of left lower leg, initial encounter: Secondary | ICD-10-CM | POA: Diagnosis not present

## 2022-10-06 DIAGNOSIS — S60222A Contusion of left hand, initial encounter: Secondary | ICD-10-CM | POA: Insufficient documentation

## 2022-10-06 DIAGNOSIS — S60221A Contusion of right hand, initial encounter: Secondary | ICD-10-CM | POA: Insufficient documentation

## 2022-10-06 DIAGNOSIS — I10 Essential (primary) hypertension: Secondary | ICD-10-CM | POA: Diagnosis not present

## 2022-10-06 DIAGNOSIS — W19XXXA Unspecified fall, initial encounter: Secondary | ICD-10-CM | POA: Insufficient documentation

## 2022-10-06 DIAGNOSIS — S6991XA Unspecified injury of right wrist, hand and finger(s), initial encounter: Secondary | ICD-10-CM | POA: Diagnosis present

## 2022-10-06 DIAGNOSIS — E119 Type 2 diabetes mellitus without complications: Secondary | ICD-10-CM | POA: Insufficient documentation

## 2022-10-06 DIAGNOSIS — S8011XA Contusion of right lower leg, initial encounter: Secondary | ICD-10-CM | POA: Insufficient documentation

## 2022-10-06 DIAGNOSIS — S0990XA Unspecified injury of head, initial encounter: Secondary | ICD-10-CM | POA: Insufficient documentation

## 2022-10-06 HISTORY — DX: Hyperlipidemia, unspecified: E78.5

## 2022-10-06 HISTORY — DX: Unspecified diastolic (congestive) heart failure: I50.30

## 2022-10-06 NOTE — Discharge Instructions (Signed)
Your history, exam, and evaluation today led [place] to get a repeat head CT given your recent intracranial bleed and the fall today.  Your head CT looks similar to prior with no new bleeding seen.  No skull fracture.  Given your report that you are having no other symptoms with specifically no fevers, chills, cough, urinary changes, bowel changes, and you feel that you are thinking completely normally, we agreed together to hold on more extensive lab workup at this time.  Please follow-up with your primary doctor and rest and stay hydrated.  Please be careful not to have further falls.  If any symptoms change or worsen, please return to the nearest emergency department.

## 2022-10-06 NOTE — ED Provider Notes (Signed)
[facility] Provider Note   CSN: [id number] Arrival date & time: 10/06/22  1244     History  Chief Complaint  Patient presents with   Eber JonesFall    Aloysuis Tiano is a 71 y.o. male.  The history is provided by the patient and medical records. No language interpreter was used.  Fall This is a recurrent problem. The current episode started 3 to 5 hours ago. The problem has been resolved. Pertinent negatives include no chest pain, no abdominal pain, no headaches and no shortness of breath. Nothing aggravates the symptoms. Nothing relieves the symptoms. He has tried nothing for the symptoms. The treatment provided no relief.       Home Medications Prior to Admission medications   Medication Sig Start Date End Date Taking? Authorizing Provider  acetaminophen (TYLENOL) 325 MG tablet Take 2 tablets (650 mg total) by mouth every 6 (six) hours as needed for mild pain or headache (fever >/= 101). 06/14/20   Lonia BloodMcClung, Jeffrey T, MD  amLODipine (NORVASC) 10 MG tablet Take 10 mg by mouth daily with supper.     [provider]  atorvastatin (LIPITOR) 20 MG tablet Take 20 mg by mouth daily with supper.     [provider]  benztropine (COGENTIN) 0.5 MG tablet Take 0.5 mg by mouth every other day. Takes with supper. 05/26/20   [provider]  fluPHENAZine (PROLIXIN) 5 MG tablet Take 5 mg by mouth daily with supper.     [provider]  gabapentin (NEURONTIN) 300 MG capsule Take 300 mg by mouth daily with supper.     [provider]  guaiFENesin-dextromethorphan (ROBITUSSIN DM) 100-10 MG/5ML syrup Take 5 mLs by mouth every 4 (four) hours as needed for cough. 06/14/20   Lonia BloodMcClung, Jeffrey T, MD  lisinopril (PRINIVIL,ZESTRIL) 40 MG tablet Take 40 mg by mouth daily with supper.     [provider]  polyethylene glycol (MIRALAX / GLYCOLAX) 17 g packet Take 17 g by mouth daily as needed for mild constipation. 06/14/20   Lonia BloodMcClung,  Jeffrey T, MD  sitaGLIPtin-metformin (JANUMET) 50-1000 MG tablet Take 1 tablet by mouth 2 (two) times daily with a meal.    [provider]  tamsulosin (FLOMAX) 0.4 MG CAPS capsule Take 0.4 mg by mouth daily after supper.  03/24/20   [provider]  traZODone (DESYREL) 50 MG tablet Take 50 mg by mouth daily with supper.  06/01/20   [provider]      Allergies    Patient has no known allergies.    Review of Systems   Review of Systems  Constitutional:  Negative for chills, fatigue and fever.  HENT:  Negative for congestion.   Eyes:  Negative for visual disturbance.  Respiratory:  Negative for cough, chest tightness, shortness of breath and wheezing.   Cardiovascular:  Negative for chest pain, palpitations and leg swelling.  Gastrointestinal:  Negative for abdominal pain, constipation, diarrhea, nausea and vomiting.  Genitourinary:  Negative for dysuria, flank pain and frequency.  Musculoskeletal:  Negative for back pain, neck pain and neck stiffness.  Skin:  Negative for rash.  Neurological:  Negative for seizures, weakness, light-headedness, numbness and headaches.  Psychiatric/Behavioral:  Negative for agitation and confusion.   All other systems reviewed and are negative.   Physical Exam Updated Vital Signs BP (!) 149/78 (BP Location: Right Arm)   Pulse 85   Temp 97.8 F (36.6 C) (Oral)   Resp 15   Ht 5\' 10"  (  1.778 m)   Wt 90 kg   SpO2 97%   BMI 28.47 kg/m  Physical Exam Vitals and nursing note reviewed.  Constitutional:      General: He is not in acute distress.    Appearance: He is well-developed. He is not ill-appearing, toxic-appearing or diaphoretic.  HENT:     Head: Normocephalic.     Nose: Nose normal.     Mouth/Throat:     Mouth: Mucous membranes are moist.  Eyes:     Conjunctiva/sclera: Conjunctivae normal.  Cardiovascular:     Rate and Rhythm: Normal rate and regular rhythm.     Heart sounds: No murmur heard. Pulmonary:      Effort: Pulmonary effort is normal. No respiratory distress.     Breath sounds: Normal breath sounds. No wheezing, rhonchi or rales.  Chest:     Chest wall: No tenderness.  Abdominal:     General: Abdomen is flat.     Palpations: Abdomen is soft.     Tenderness: There is no abdominal tenderness. There is no right CVA tenderness, left CVA tenderness, guarding or rebound.  Musculoskeletal:        General: No swelling or tenderness.     Cervical back: Neck supple. No tenderness.     Right lower leg: No edema.     Left lower leg: No edema.  Skin:    General: Skin is warm and dry.     Capillary Refill: Capillary refill takes less than 2 seconds.     Findings: Bruising present. No erythema.     Comments: Brusing and some abrasions on extremities  Neurological:     General: No focal deficit present.     Mental Status: He is alert.     Sensory: No sensory deficit.     Motor: No weakness.  Psychiatric:        Mood and Affect: Mood normal.     ED Results / Procedures / Treatments   Labs (all labs ordered are listed, but only abnormal results are displayed) Labs Reviewed - No data to display  EKG None  Radiology CT HEAD WO CONTRAST ( )  Result Date: 10/06/2022 CLINICAL DATA:  Head trauma, moderate to severe. Recent subdural hematoma. Mechanical fall hitting his head today. EXAM: CT HEAD WITHOUT CONTRAST TECHNIQUE: Contiguous axial images were obtained from the base of the skull through the vertex without intravenous contrast. RADIATION DOSE REDUCTION: This exam was performed according to the departmental dose-optimization program which includes automated exposure control, adjustment of the mA and/or kV according to patient size and/or use of iterative reconstruction technique. COMPARISON:  CT head without contrast 09/28/2022 FINDINGS: Brain: 5 mm extra-axial hemorrhage over the anterior lobe is stable in size. Decreased density is consistent with evolving blood products. No new  hemorrhage is present. A smaller posterior extra-axial collection is better seen on the axial images, also stable. Moderate generalized atrophy and white matter disease is stable. No acute cortical infarct is present. Deep brain nuclei are within normal limits. Remote ischemic changes are present in the thalami bilaterally. A remote posteroinferior right cerebellar infarct is stable. Vascular: Atherosclerotic calcifications are present within the cavernous internal carotid arteries in the dural margin of both vertebral arteries. Skull: Calvarium is intact. No focal lytic or blastic lesions are present. No significant extracranial soft tissue lesion is present. Sinuses/Orbits: The paranasal sinuses and mastoid air cells are clear. The globes and orbits are within normal limits. IMPRESSION: 1. Stable size of 5 mm extra-axial hemorrhage over  the anterior lobe. Decreased density is consistent with evolving blood products. 2. Stable smaller posterior extra-axial collection is better seen on the axial images, also stable. 3. No acute intracranial abnormality or significant interval change. No new progressive hemorrhage. 4. Stable atrophy and white matter disease. This likely reflects the sequela of chronic microvascular ischemia. 5. Stable remote ischemic changes in the thalami bilaterally and posteroinferior right cerebellar hemisphere. Electronically Signed   By: Marin Roberts M.D.   On: 10/06/2022 13:54    Procedures Procedures    Medications Ordered in ED Medications - No data to display  ED Course/ Medical Decision Making/ A&P                           Medical Decision Making  Kru Allman is a 71 y.o. male with a past medical history significant for hypertension, hyperlipidemia, diabetes, schizophrenia, recent fall with subdural hematoma who presents with a fall.  According to EMS report, patient has altered mental status at his baseline and was slightly more altered today after a fall.  He is  also history of UTIs and bowel obstructions but he is fervently denying any symptoms at this time.  Patient does agree that he had a mechanical fall today while trying to get up to go to his walker but denies any other preceding symptoms.  He reports feeling completely normal now specifically with no fevers, chills, ingestion, cough, nausea, vomiting, constipation, diarrhea, or urinary changes.  He says he does not have any headache or neck pain.  Denies any extremity pains.  He is feeling much better and does not want to be here.  On my exam, lungs were clear and chest was nontender.  Abdomen nontender.  Patient moving all extremities.  No focal neurologic deficits initially.  Pupils are symmetric and reactive, Movements.  His neck was nontender.  No large lacerations or hematoma seen on my initial external exam of the head.  Patient has abrasions on his hands and legs from previous falls he reports but is having no complaints now.  Patient agreed to get a head CT scan given his recent subdural hematoma and his fall today however he does not want more extensive workup.  CT head shows similar bleeding to prior with no new skull fracture and specifically no new bleeding.  Old strokes and chronic changes are seen.  Given the lack of any other symptoms or preceding symptoms, patient would like to go home.  Will hold on more extensive lab workup and imaging and patient agrees.  Patient be discharged back to his facility after reassuring head CT after fall today.             Final Clinical Impression(s) / ED Diagnoses Final diagnoses:  Fall, initial encounter  Injury of head, initial encounter    Rx / DC Orders ED Discharge Orders     None      Clinical Impression: 1. Fall, initial encounter   2. Injury of head, initial encounter     Disposition: Discharge  Condition: Good  I have discussed the results, Dx and Tx plan with the pt(& family if present). He/she/they expressed  understanding and agree(s) with the plan. Discharge instructions discussed at great length. Strict return precautions discussed and pt &/or family have verbalized understanding of the instructions. No further questions at time of discharge.    New Prescriptions   No medications on file    Follow Up: Jolene Provost,  MD [address] [place] [address] [phone]     [facility] [address] [id number] mc [address] [place] [address] [phone]       Maleek Craver, Canary Brim, MD 10/06/22 [phone]

## 2022-10-06 NOTE — ED Triage Notes (Addendum)
BIB [facility], coming from [facility], EMS reports his is altered from his baseline (altered at baseline). Has had multiple falls over the past few weeks, not on thinners, unwitnessed fall today. Hx of UTI and bowel obstruction. Patient denies pain, denies hitting head, reports BM yesterday was normal and denies urinary symptoms.   Vitals  T 99.8  BP 170/90 O2 95%  P 95%  RR 16  CBG 162

## 2022-10-06 NOTE — ED Notes (Signed)
[facility] called for transport back to [facility] 

## 2024-10-20 ENCOUNTER — Emergency Department (HOSPITAL_BASED_OUTPATIENT_CLINIC_OR_DEPARTMENT_OTHER)

## 2024-10-20 ENCOUNTER — Encounter (HOSPITAL_BASED_OUTPATIENT_CLINIC_OR_DEPARTMENT_OTHER): Payer: Self-pay | Admitting: Emergency Medicine

## 2024-10-20 ENCOUNTER — Other Ambulatory Visit: Payer: Self-pay

## 2024-10-20 ENCOUNTER — Observation Stay (HOSPITAL_BASED_OUTPATIENT_CLINIC_OR_DEPARTMENT_OTHER)
Admission: EM | Admit: 2024-10-20 | Discharge: 2024-10-22 | Disposition: A | Discharge status: Skilled Nursing Facility | Attending: Internal Medicine | Admitting: Internal Medicine

## 2024-10-20 DIAGNOSIS — E1169 Type 2 diabetes mellitus with other specified complication: Secondary | ICD-10-CM | POA: Insufficient documentation

## 2024-10-20 DIAGNOSIS — E1159 Type 2 diabetes mellitus with other circulatory complications: Secondary | ICD-10-CM | POA: Insufficient documentation

## 2024-10-20 DIAGNOSIS — E039 Hypothyroidism, unspecified: Secondary | ICD-10-CM | POA: Insufficient documentation

## 2024-10-20 DIAGNOSIS — Z79899 Other long term (current) drug therapy: Secondary | ICD-10-CM | POA: Insufficient documentation

## 2024-10-20 DIAGNOSIS — F209 Schizophrenia, unspecified: Secondary | ICD-10-CM | POA: Insufficient documentation

## 2024-10-20 DIAGNOSIS — D72829 Elevated white blood cell count, unspecified: Secondary | ICD-10-CM | POA: Insufficient documentation

## 2024-10-20 DIAGNOSIS — I110 Hypertensive heart disease with heart failure: Secondary | ICD-10-CM | POA: Insufficient documentation

## 2024-10-20 DIAGNOSIS — N209 Urinary calculus, unspecified: Secondary | ICD-10-CM | POA: Insufficient documentation

## 2024-10-20 DIAGNOSIS — N400 Enlarged prostate without lower urinary tract symptoms: Secondary | ICD-10-CM | POA: Diagnosis present

## 2024-10-20 DIAGNOSIS — I152 Hypertension secondary to endocrine disorders: Secondary | ICD-10-CM | POA: Insufficient documentation

## 2024-10-20 DIAGNOSIS — E119 Type 2 diabetes mellitus without complications: Secondary | ICD-10-CM | POA: Insufficient documentation

## 2024-10-20 DIAGNOSIS — S270XXA Traumatic pneumothorax, initial encounter: Secondary | ICD-10-CM | POA: Insufficient documentation

## 2024-10-20 DIAGNOSIS — Y929 Unspecified place or not applicable: Secondary | ICD-10-CM | POA: Insufficient documentation

## 2024-10-20 DIAGNOSIS — N401 Enlarged prostate with lower urinary tract symptoms: Secondary | ICD-10-CM | POA: Insufficient documentation

## 2024-10-20 DIAGNOSIS — I5030 Unspecified diastolic (congestive) heart failure: Secondary | ICD-10-CM | POA: Insufficient documentation

## 2024-10-20 DIAGNOSIS — S2242XA Multiple fractures of ribs, left side, initial encounter for closed fracture: Principal | ICD-10-CM | POA: Insufficient documentation

## 2024-10-20 DIAGNOSIS — N289 Disorder of kidney and ureter, unspecified: Secondary | ICD-10-CM | POA: Insufficient documentation

## 2024-10-20 DIAGNOSIS — W010XXA Fall on same level from slipping, tripping and stumbling without subsequent striking against object, initial encounter: Secondary | ICD-10-CM | POA: Insufficient documentation

## 2024-10-20 DIAGNOSIS — R5381 Other malaise: Secondary | ICD-10-CM | POA: Insufficient documentation

## 2024-10-20 DIAGNOSIS — E785 Hyperlipidemia, unspecified: Secondary | ICD-10-CM | POA: Insufficient documentation

## 2024-10-20 DIAGNOSIS — G2111 Neuroleptic induced parkinsonism: Secondary | ICD-10-CM | POA: Insufficient documentation

## 2024-10-20 DIAGNOSIS — N200 Calculus of kidney: Secondary | ICD-10-CM

## 2024-10-20 DIAGNOSIS — S0990XA Unspecified injury of head, initial encounter: Principal | ICD-10-CM

## 2024-10-20 LAB — CBC WITH DIFFERENTIAL/PLATELET
Abs Immature Granulocytes: 0.18 K/uL — ABNORMAL HIGH (ref 0.00–0.07)
Basophils Absolute: 0 K/uL (ref 0.0–0.1)
Basophils Relative: 0 %
Eosinophils Absolute: 0 K/uL (ref 0.0–0.5)
Eosinophils Relative: 0 %
HCT: 37.9 % — ABNORMAL LOW (ref 39.0–52.0)
Hemoglobin: 12.5 g/dL — ABNORMAL LOW (ref 13.0–17.0)
Immature Granulocytes: 1 %
Lymphocytes Relative: 4 %
Lymphs Abs: 0.8 K/uL (ref 0.7–4.0)
MCH: 29.3 pg (ref 26.0–34.0)
MCHC: 33 g/dL (ref 30.0–36.0)
MCV: 89 fL (ref 80.0–100.0)
Monocytes Absolute: 1.2 K/uL — ABNORMAL HIGH (ref 0.1–1.0)
Monocytes Relative: 6 %
Neutro Abs: 17.7 K/uL — ABNORMAL HIGH (ref 1.7–7.7)
Neutrophils Relative %: 89 %
Platelets: 393 K/uL (ref 150–400)
RBC: 4.26 MIL/uL (ref 4.22–5.81)
RDW: 14.2 % (ref 11.5–15.5)
WBC: 20 K/uL — ABNORMAL HIGH (ref 4.0–10.5)
nRBC: 0 % (ref 0.0–0.2)

## 2024-10-20 LAB — I-STAT VENOUS BLOOD GAS, ED
Acid-base deficit: 6 mmol/L — ABNORMAL HIGH (ref 0.0–2.0)
Bicarbonate: 18.7 mmol/L — ABNORMAL LOW (ref 20.0–28.0)
Calcium, Ion: 1.24 mmol/L (ref 1.15–1.40)
HCT: 35 % — ABNORMAL LOW (ref 39.0–52.0)
Hemoglobin: 11.9 g/dL — ABNORMAL LOW (ref 13.0–17.0)
O2 Saturation: 59 %
Potassium: 3.9 mmol/L (ref 3.5–5.1)
Sodium: 138 mmol/L (ref 135–145)
TCO2: 20 mmol/L — ABNORMAL LOW (ref 22–32)
pCO2, Ven: 34.7 mmHg — ABNORMAL LOW (ref 44–60)
pH, Ven: 7.339 (ref 7.25–7.43)
pO2, Ven: 32 mmHg (ref 32–45)

## 2024-10-20 LAB — RESP PANEL BY RT-PCR (RSV, FLU A&B, COVID)  RVPGX2
Influenza A by PCR: NEGATIVE
Influenza B by PCR: NEGATIVE
Resp Syncytial Virus by PCR: NEGATIVE
SARS Coronavirus 2 by RT PCR: NEGATIVE

## 2024-10-20 LAB — BASIC METABOLIC PANEL WITH GFR
Anion gap: 17 — ABNORMAL HIGH (ref 5–15)
BUN: 25 mg/dL — ABNORMAL HIGH (ref 8–23)
CO2: 19 mmol/L — ABNORMAL LOW (ref 22–32)
Calcium: 9.8 mg/dL (ref 8.9–10.3)
Chloride: 102 mmol/L (ref 98–111)
Creatinine, Ser: 1.53 mg/dL — ABNORMAL HIGH (ref 0.61–1.24)
GFR, Estimated: 48 mL/min — ABNORMAL LOW
Glucose, Bld: 226 mg/dL — ABNORMAL HIGH (ref 70–99)
Potassium: 4.5 mmol/L (ref 3.5–5.1)
Sodium: 138 mmol/L (ref 135–145)

## 2024-10-20 LAB — URINALYSIS, MICROSCOPIC (REFLEX)

## 2024-10-20 LAB — URINALYSIS, ROUTINE W REFLEX MICROSCOPIC
Glucose, UA: 100 mg/dL — AB
Hgb urine dipstick: NEGATIVE
Ketones, ur: NEGATIVE mg/dL
Leukocytes,Ua: NEGATIVE
Nitrite: NEGATIVE
Protein, ur: 100 mg/dL — AB
Specific Gravity, Urine: >=1.03 (ref 1.005–1.030)
pH: 5.5 (ref 5.0–8.0)

## 2024-10-20 LAB — LACTIC ACID, PLASMA
Lactic Acid, Venous: 3.9 mmol/L (ref 0.5–1.9)
Lactic Acid, Venous: 1.1 mmol/L (ref 0.5–1.9)

## 2024-10-20 LAB — CBG MONITORING, ED: Glucose-Capillary: 117 mg/dL — ABNORMAL HIGH (ref 70–99)

## 2024-10-20 MED ORDER — TRAMADOL HCL 50 MG PO TABS: 50.0000 mg | ORAL_TABLET | Freq: Four times a day (QID) | ORAL | Status: DC | PRN

## 2024-10-20 MED ORDER — LACTATED RINGERS IV BOLUS
1000.0000 mL | Freq: Once | INTRAVENOUS | Status: AC
  Administered 2024-10-20: 1000 mL via INTRAVENOUS

## 2024-10-20 MED ORDER — AMLODIPINE BESYLATE 5 MG PO TABS
5.0000 mg | ORAL_TABLET | Freq: Every day | ORAL | Status: DC
  Administered 2024-10-21 – 2024-10-22 (×2): 5 mg via ORAL
  Filled 2024-10-20 (×2): qty 1

## 2024-10-20 MED ORDER — LACTATED RINGERS IV SOLN: INTRAVENOUS | Status: DC

## 2024-10-20 MED ORDER — HYDROMORPHONE HCL 1 MG/ML IJ SOLN: 0.5000 mg | INTRAMUSCULAR | Status: DC | PRN

## 2024-10-20 MED ORDER — INSULIN ASPART 100 UNIT/ML IJ SOLN
0.0000 [IU] | Freq: Three times a day (TID) | INTRAMUSCULAR | Status: DC
  Filled 2024-10-20: qty 1

## 2024-10-20 MED ORDER — INSULIN ASPART 100 UNIT/ML IJ SOLN: 0.0000 [IU] | Freq: Every day | INTRAMUSCULAR | Status: DC

## 2024-10-20 MED ORDER — ACETAMINOPHEN 500 MG PO TABS
1000.0000 mg | ORAL_TABLET | Freq: Three times a day (TID) | ORAL | Status: DC
  Administered 2024-10-20 – 2024-10-22 (×6): 1000 mg via ORAL
  Filled 2024-10-20 (×6): qty 2

## 2024-10-20 MED ORDER — BENZTROPINE MESYLATE 0.5 MG PO TABS
0.5000 mg | ORAL_TABLET | ORAL | Status: DC
  Administered 2024-10-21: 0.5 mg via ORAL
  Filled 2024-10-20: qty 1

## 2024-10-20 MED ORDER — LIDOCAINE 5 % EX PTCH
2.0000 | MEDICATED_PATCH | CUTANEOUS | Status: DC
  Administered 2024-10-20: 2 via TRANSDERMAL
  Filled 2024-10-20 (×2): qty 2

## 2024-10-20 MED ORDER — ONDANSETRON HCL 4 MG PO TABS: 4.0000 mg | ORAL_TABLET | Freq: Four times a day (QID) | ORAL | Status: DC | PRN

## 2024-10-20 MED ORDER — ENOXAPARIN SODIUM 40 MG/0.4ML IJ SOSY
40.0000 mg | PREFILLED_SYRINGE | INTRAMUSCULAR | Status: DC
  Administered 2024-10-20 – 2024-10-21 (×2): 40 mg via SUBCUTANEOUS
  Filled 2024-10-20 (×2): qty 0.4

## 2024-10-20 MED ORDER — BISACODYL 5 MG PO TBEC: 5.0000 mg | DELAYED_RELEASE_TABLET | Freq: Every day | ORAL | Status: DC | PRN

## 2024-10-20 MED ORDER — FLUPHENAZINE HCL 5 MG PO TABS
2.5000 mg | ORAL_TABLET | Freq: Every day | ORAL | Status: DC
  Administered 2024-10-21 – 2024-10-22 (×2): 2.5 mg via ORAL
  Filled 2024-10-20 (×2): qty 1

## 2024-10-20 MED ORDER — LACTATED RINGERS IV SOLN: INTRAVENOUS | Status: AC

## 2024-10-20 MED ORDER — METHOCARBAMOL 500 MG PO TABS
500.0000 mg | ORAL_TABLET | Freq: Three times a day (TID) | ORAL | Status: DC
  Administered 2024-10-20 – 2024-10-22 (×6): 500 mg via ORAL
  Filled 2024-10-20 (×6): qty 1

## 2024-10-20 MED ORDER — ONDANSETRON HCL 4 MG/2ML IJ SOLN: 4.0000 mg | Freq: Four times a day (QID) | INTRAMUSCULAR | Status: DC | PRN

## 2024-10-20 MED ORDER — ACETAMINOPHEN 325 MG PO TABS
650.0000 mg | ORAL_TABLET | Freq: Once | ORAL | Status: AC
  Administered 2024-10-20: 650 mg via ORAL
  Filled 2024-10-20: qty 2

## 2024-10-20 MED ORDER — LEVOTHYROXINE SODIUM 75 MCG PO TABS
75.0000 ug | ORAL_TABLET | Freq: Every day | ORAL | Status: DC
  Administered 2024-10-21 – 2024-10-22 (×2): 75 ug via ORAL
  Filled 2024-10-20 (×2): qty 1

## 2024-10-20 MED ORDER — TAMSULOSIN HCL 0.4 MG PO CAPS
0.4000 mg | ORAL_CAPSULE | Freq: Every day | ORAL | Status: DC
  Administered 2024-10-21: 0.4 mg via ORAL
  Filled 2024-10-20: qty 1

## 2024-10-20 MED ORDER — ATORVASTATIN CALCIUM 10 MG PO TABS
10.0000 mg | ORAL_TABLET | Freq: Every day | ORAL | Status: DC
  Administered 2024-10-21 – 2024-10-22 (×2): 10 mg via ORAL
  Filled 2024-10-20 (×2): qty 1

## 2024-10-20 MED ORDER — SENNOSIDES-DOCUSATE SODIUM 8.6-50 MG PO TABS: 1.0000 | ORAL_TABLET | Freq: Every evening | ORAL | Status: DC | PRN

## 2024-10-20 NOTE — ED Triage Notes (Signed)
 Has fallen 3 x in last 48 hours, last fall this am, witnessed bu staff at [facility], tripped and fell back hitting back of head no LOC or thinners does has  hematoma to back of head  does walk with a walker

## 2024-10-20 NOTE — ED Notes (Signed)
 Pt denied any pain, no worsening of pain with palpation or movement. Sitting upright in bed comfortably without complaint. Requested to turn the lights off.

## 2024-10-20 NOTE — ED Notes (Signed)
 EDP holding off on antbx until hospitalist consult, will draw second lactic after this LR bolus finishes.

## 2024-10-20 NOTE — ED Notes (Signed)
 Pt has returned to their room

## 2024-10-20 NOTE — ED Triage Notes (Signed)
 Pt presents to ed via [facility] as trauma transfer after multiple falls the past 48 hrs. He sustained head injury. CT neg. LL pneumothorax. Unlabored respirations. On 2lpm [facility] enroute. No loc not on thinners. CTLS cleared.

## 2024-10-20 NOTE — Hospital Course (Signed)
 Jaime Lopez is a 74 y.o. 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

## 2024-10-20 NOTE — ED Notes (Signed)
 Received phone call from [facility] requesting an update on pt status and POC. Staff updated by this RN.  Staff requested a call back with his status. [facility] - [place] - Nursing Director [phone]. Ok to leave a message with other staff.

## 2024-10-20 NOTE — ED Provider Notes (Signed)
 Patient was transferred for outside facility to see the trauma service and be admitted by medicine.  When I went to the patient patient resting comfortably in no distress with oxygen saturations in the 90s.  Trauma was going to come to the bedside to see them and medicine will admit.  5:54 PM Nursing asked for me to go reassess patient.  Patient had no complaints and is denying any  shortness of breath at this time.  Oxygen saturations in the 90s on room air.  I spoke to medicine who will see for admission.   Clinical Impression: 1. Injury of head, initial encounter   2. Closed fracture of multiple ribs of left side, initial encounter   3. Traumatic pneumothorax, initial encounter     Disposition: Admit  This note was prepared with assistance of Dragon voice recognition software. Occasional wrong-word or sound-a-like substitutions may have occurred due to the inherent limitations of voice recognition software.     Ezekiel Menzer, Lonni PARAS, MD 10/20/24 [phone]

## 2024-10-20 NOTE — ED Notes (Addendum)
 Pt tolerated In and out cath well. Approx 60 ml of tea colored urine. Pts brief saturated with urine. Pericare provided, barrier cream and brief applied

## 2024-10-20 NOTE — ED Provider Notes (Signed)
 " Jaime Lopez Provider Note   CSN: [id number] Arrival date & time: 10/20/24  0909     Patient presents with: Fall and Head Injury   Jaime Lopez is a 74 y.o. 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  Patient states he has had 3 falls within the past 48 hours. Notes he has not had a fall prior to this in over a year? He notes he is tripping over his feet.  Denies any unilateral weakness, visual changes, or speech changes.  This morning his fall was witnessed by staff at [facility] where patient tripped and fell backwards hitting the posterior aspect of his head.  No LOC.  Not on any blood thinners.  Patient typically ambulates with a walker.  Admits to pain in his low back.  Denies any numbness/tingling or weakness.  Difficult to obtain HPI.  Level 5 caveat.  History obtained from patient and past medical records. No interpreter used during encounter.       Prior to Admission medications  Medication Sig Start Date End Date Taking? Authorizing Provider  amLODipine  (NORVASC ) 5 MG tablet Take 5 mg by mouth daily. 07/04/20  Yes [provider]  atorvastatin  (LIPITOR) 10 MG tablet Take 10 mg by mouth daily. 10/11/24  Yes [provider]  fluPHENAZine  (PROLIXIN ) 2.5 MG tablet Take 2.5 mg by mouth daily. 10/15/22  Yes [provider]  JANUVIA 100 MG tablet Take 100 mg by mouth daily. 10/06/24  Yes [provider]  levothyroxine  (SYNTHROID ) 75 MCG tablet Take 75 mcg by mouth daily. 10/14/22  Yes [provider]  metFORMIN (GLUCOPHAGE) 1000 MG tablet Take 1,000 mg by mouth 2 (two) times daily. 10/06/24  Yes [provider]  acetaminophen  (TYLENOL ) 325 MG tablet Take 2 tablets (650 mg total) by mouth  every 6 (six) hours as needed for mild pain or headache (fever >/= 101). 06/14/20   Danton Reyes DASEN, MD  amLODipine  (NORVASC ) 10 MG tablet Take 10 mg by mouth daily with supper.     [provider]  atorvastatin  (LIPITOR) 20 MG tablet Take 20 mg by mouth daily with supper.     [provider]  benztropine  (COGENTIN ) 0.5 MG tablet Take 0.5 mg by mouth every other day. Takes with supper. 05/26/20   [provider]  fluPHENAZine  (PROLIXIN ) 5 MG tablet Take 5 mg by mouth daily with supper.     [provider]  gabapentin  (NEURONTIN ) 300 MG capsule Take 300 mg by mouth daily with supper.     [provider]  guaiFENesin -dextromethorphan  (ROBITUSSIN DM) 100-10 MG/5ML syrup Take 5 mLs by mouth every 4 (four) hours as needed for cough. 06/14/20   Danton Reyes DASEN, MD  lisinopril  (PRINIVIL ,ZESTRIL ) 40 MG tablet Take 40 mg by mouth daily with supper.     [provider]  polyethylene glycol (MIRALAX  / GLYCOLAX ) 17 g packet Take 17 g by mouth daily as needed for mild constipation. 06/14/20   Danton Reyes DASEN, MD  sitaGLIPtin-metformin (JANUMET) 50-1000 MG tablet Take 1 tablet by mouth 2 (two) times daily with a meal.    [provider]  tamsulosin  (FLOMAX ) 0.4 MG CAPS capsule Take 0.4 mg by mouth daily after supper.  03/24/20   [provider]  traZODone  (DESYREL ) 50 MG tablet Take 50 mg  by mouth daily with supper.  06/01/20   [provider]    Allergies: Patient has no known allergies.    Review of Systems  Respiratory:  Negative for shortness of breath.   Cardiovascular:  Negative for chest pain.  Gastrointestinal:  Negative for abdominal pain.  Musculoskeletal:  Positive for back pain. Negative for gait problem.    Updated Vital Signs BP 122/73 (BP Location: Right Arm)   Pulse 95   Temp 98.3 F (36.8 C) (Oral)   Resp 14   Ht 5' 10 (1.778 m)   Wt 90.3 kg   SpO2 95%   BMI 28.55 kg/m   Physical Exam Vitals and  nursing note reviewed.  Constitutional:      General: He is not in acute distress.    Appearance: He is not ill-appearing.  HENT:     Head: Normocephalic.     Comments: Hematoma to right posterior head. Eyes:     Pupils: Pupils are equal, round, and reactive to light.  Neck:     Comments: No cervical midline tenderness. Cardiovascular:     Rate and Rhythm: Normal rate and regular rhythm.     Pulses: Normal pulses.     Heart sounds: Normal heart sounds. No murmur heard.    No friction rub. No gallop.  Pulmonary:     Effort: Pulmonary effort is normal.     Breath sounds: Normal breath sounds.  Abdominal:     General: Abdomen is flat. There is no distension.     Palpations: Abdomen is soft.     Tenderness: There is no abdominal tenderness. There is no guarding or rebound.  Musculoskeletal:        General: Normal range of motion.     Cervical back: Neck supple.     Comments: Mild thoracic and lumbar midline tenderness.  Able to lift both lower extremities off the bed.  No bony tenderness to bilateral hips.  Skin:    General: Skin is warm and dry.  Neurological:     General: No focal deficit present.     Mental Status: He is alert.     Comments: Normal speech.  No facial droop.  Able to move all 4 extremities without difficulty.  Psychiatric:        Mood and Affect: Mood normal.        Behavior: Behavior normal.     (all labs ordered are listed, but only abnormal results are displayed) Labs Reviewed  CBC WITH DIFFERENTIAL/PLATELET - Abnormal; Notable for the following components:      Result Value   WBC 20.0 (*)    Hemoglobin 12.5 (*)    HCT 37.9 (*)    Neutro Abs 17.7 (*)    Monocytes Absolute 1.2 (*)    Abs Immature Granulocytes 0.18 (*)    All other components within normal limits  BASIC METABOLIC PANEL WITH GFR - Abnormal; Notable for the following components:   CO2 19 (*)    Glucose, Bld 226 (*)    BUN 25 (*)    Creatinine, Ser 1.53 (*)    GFR, Estimated 48 (*)     Anion gap 17 (*)    All other components within normal limits  URINALYSIS, ROUTINE W REFLEX MICROSCOPIC - Abnormal; Notable for the following components:   Glucose, UA 100 (*)    Bilirubin Urine SMALL (*)    Protein, ur 100 (*)    All other components within normal limits  URINALYSIS, MICROSCOPIC (REFLEX) -  Abnormal; Notable for the following components:   Bacteria, UA FEW (*)    All other components within normal limits  I-STAT VENOUS BLOOD GAS, ED - Abnormal; Notable for the following components:   pCO2, Ven 34.7 (*)    Bicarbonate 18.7 (*)    TCO2 20 (*)    Acid-base deficit 6.0 (*)    HCT 35.0 (*)    Hemoglobin 11.9 (*)    All other components within normal limits  RESP PANEL BY RT-PCR (RSV, FLU A&B, COVID)  RVPGX2  CULTURE, BLOOD (ROUTINE X 2)  CULTURE, BLOOD (ROUTINE X 2)  LACTIC ACID, PLASMA  LACTIC ACID, PLASMA    EKG: None  Radiology: CT CHEST ABDOMEN PELVIS WO CONTRAST Result Date: 10/20/2024 EXAM: CT CHEST, ABDOMEN AND PELVIS WITHOUT CONTRAST 10/20/2024 10:39:00 AM TECHNIQUE: CT of the chest, abdomen and pelvis was performed without the administration of intravenous contrast. Multiplanar reformatted images are provided for review. Automated exposure control, iterative reconstruction, and/or weight based adjustment of the mA/kV was utilized to reduce the radiation dose to as low as reasonably achievable. COMPARISON: None available. CLINICAL HISTORY: Back trauma, no prior imaging (Age >= 16y) The patient experienced back trauma and has not had any prior imaging. The patient is 74 years of age or older. FINDINGS: CHEST: MEDIASTINUM AND LYMPH NODES: Heart and pericardium are unremarkable. Extensive 3-vessel coronary calcifications. The central airways are clear. Posterior and left deep neck emphysema with some extension into the mediastinum. No mediastinal, hilar or axillary lymphadenopathy. LUNGS AND PLEURA: Mild dependent atelectasis posteriorly in the lower lobes. Small  left pneumothorax anteriorly at the lung base. Trace left pleural effusion. No focal consolidation or pulmonary edema. ABDOMEN AND PELVIS: LIVER: Unremarkable. GALLBLADDER AND BILE DUCTS: Unremarkable. No biliary ductal dilatation. SPLEEN: No acute abnormality. PANCREAS: Pancreatic parenchymal atrophy without ductal dilatation or mass. ADRENAL GLANDS: 3.4 cm left adrenal adenoma as described on prior MR of 11/07/21. KIDNEYS, URETERS AND BLADDER: Left urolithiasis with a large central 2.9 cm calculus, no hydronephrosis. No perinephric or periureteral stranding. Urinary bladder is unremarkable. GI AND BOWEL: Stomach demonstrates no acute abnormality. Moderate gaseous distention of the colon. Normal appendix. There is no bowel obstruction. REPRODUCTIVE ORGANS: Mild prostate enlargement with central coarse calcification. PERITONEUM AND RETROPERITONEUM: No ascites. No free air. VASCULATURE: Aorta is normal in caliber. Heavy aortic iliac calcified plaque without triple A. ABDOMINAL AND PELVIS LYMPH NODES: No lymphadenopathy. BONES AND SOFT TISSUES: Subcutaneous gas in the deep tissues of the posterior and lateral left thorax and upper abdomen. Fixation hardware in the proximal right femur. Old fracture deformity of the anterior right acetabulum. Bridging osteophytosis across multiple contiguous levels from the mid thoracic spine to the thoracolumbar junction. Exuberant anterior endplate spurring at all levels of the lumbar spine. Multiple old healed bilateral rib fracture deformities. Right psoas muscular atrophy. Displaced fracture of the posterior aspect of left sixth rib. No other acute osseous abnormality. IMPRESSION: 1. Displaced fracture of the posterior aspect of the left sixth rib with associated subcutaneous emphysema in the posterior and lateral left thorax and upper abdomen, and small left pneumothorax at the anterior left lung base. 2. Posterior and left deep neck emphysema with extension into the mediastinum.  3. Left urolithiasis with a large central 2.9 cm calculus, without hydronephrosis. Electronically signed by: Katheleen Faes MD 10/20/2024 11:47 AM EST RP Workstation: [id number]   CT L-SPINE NO CHARGE Result Date: 10/20/2024 EXAM: CT OF THE LUMBAR SPINE WITHOUT CONTRAST 10/20/2024 10:39:00 AM TECHNIQUE: CT of the lumbar spine  was performed without the administration of intravenous contrast. Multiplanar reformatted images are provided for review. Automated exposure control, iterative reconstruction, and/or weight based adjustment of the mA/kV was utilized to reduce the radiation dose to as low as reasonably achievable. COMPARISON: None available. CLINICAL HISTORY: Multiple falls, back trauma. FINDINGS: BONES AND ALIGNMENT: Normal vertebral body heights. 25 percent chronic appearing anterior wedge compression fracture at L1. Large right eccentric Schmorl's node along the superior endplate of L4. 2 mm grade 1 degenerative retrolisthesis at L2-L3. No acute fracture or suspicious bone lesion identified in the lumbar spine. No acute subluxation identified. Normal alignment otherwise. DEGENERATIVE FINDINGS: Bridging spurring in the lower thoracic spine and extending down to L2 with anterior articular spurring at L2-L3 and L3-L4. Borderline bilateral foraminal stenosis at L4-L5 due to facet arthropathy and mild disc bulge. Facet arthropathy at L4-L5. Mild disc bulge at L4-L5. SOFT TISSUES: Abdominal aortic atherosclerosis with prominent atheromatous plaque proximally in the superior mesenteric artery. Left renal staghorn calculi including a 2.9 cm calculus in the left renal collecting system. Small amount of subcutaneous gas along the left posterior paraspinal musculature. Atrophic right iliacus muscle. Mild relative atrophy of the right psoas muscle compared to the left. Bridging anterior spurring of both sacroiliac joints. L5: Unremarkable. IMPRESSION: 1. No CT evidence of acute lumbar spine fracture or acute  subluxation. 2. Small amount of subcutaneous gas along the left posterior paraspinal musculature. 3. Chronic 25% anterior wedge compression fracture at L1. 4. Large right eccentric Schmorl node along the superior endplate of L4. 5. 2 mm grade 1 degenerative retrolisthesis at L2-3. 6. Degenerative findings without overt impingement at lower thoracic spine to L2, L2-3, L3-4, L4-5. 7. Bridging anterior spurring of both sacroiliac joints. 8. Left renal staghorn calculus, including a 2.9 cm calculus in the left renal collecting system. 9. Abdominal aortic atherosclerosis with severe atheromatous plaque proximally in the superior mesenteric artery. 10. Atrophic right iliacus muscle and mild relative atrophy of the right psoas muscle compared to the left. Electronically signed by: Ryan Salvage MD 10/20/2024 11:33 AM EST RP Workstation: [id number]   CT Thoracic Spine Wo Contrast Result Date: 10/20/2024 EXAM: CT THORACIC SPINE WITHOUT CONTRAST 10/20/2024 10:39:00 AM TECHNIQUE: CT of the thoracic spine was performed without the administration of intravenous contrast. Multiplanar reformatted images are provided for review. Automated exposure control, iterative reconstruction, and/or weight based adjustment of the mA/kV was utilized to reduce the radiation dose to as low as reasonably achievable. COMPARISON: Chest radiograph 06/06/2020. CLINICAL HISTORY: Multiple falls of the last 2 days, back trauma. FINDINGS: BONES AND ALIGNMENT: Normal vertebral body heights. Left rib fractures including a mildly comminuted fracture of the left posterior sixth rib with adjacent gas and fluid in the pleural space implying hemopneumothorax. Left medial 7th rib fracture noted. Chronic appearing wedging at multiple thoracic levels including T6, T7, T8, and T9 without a well defined acute thoracic spine fracture. Callus formation along additional lower medial ribs likely from old fractures. Old bilateral rib deformities from healed  fractures. Normal alignment. No suspicious bone lesion. DEGENERATIVE CHANGES: Flowing anterior osteophytes in the thoracic spine extending from the T4 level through the lumbar spine noted. Chronic appearing wedging at multiple thoracic levels including T6, T7, T8, and T9 without a well defined acute thoracic spine fracture. No substantial degree of bony impingement identified. SOFT TISSUES: Pneumomediastinum observed in the upper thorax. Adjacent gas and fluid in the pleural space implying hemopneumothorax. Left paraspinal gas tracking along paraspinal musculature along with left subcutaneous emphysema. Suspected atelectasis  in both lower lobes. Thoracic aortic, coronary artery, and branch vessel atheromatous vascular calcifications. Atheromatous abdominal aorta and substantial atheromatous plaque proximally in the superior mesenteric artery. Fullness of both adrenal glands. These findings will be further investigated on dedicated chest CT under separate report. IMPRESSION: 1. Acute left rib fractures, including a mildly comminuted left posterior sixth rib fracture and left medial seventh rib fracture, with adjacent pleural gas and fluid consistent with hemopneumothorax. 2. Pneumomediastinum with left paraspinal gas tracking along the paraspinal musculature and left subcutaneous emphysema. 3. No acute thoracic spine fracture. 4. Bibasilar atelectasis. 5. Chronic-appearing thoracic vertebral body wedging at T6, T7, T8, and T9, with flowing anterior osteophytes extending from T4 through the lumbar spine. 6. Chronic healed rib fracture deformities with callus formation along additional lower medial ribs. 7. Atherosclerotic calcifications involving the thoracic aorta, coronary arteries, branch vessels, abdominal aorta, and proximal superior mesenteric artery. 8. Acute nonspinal findings to be further investigated with dedicated CT chest. Electronically signed by: Ryan Salvage MD 10/20/2024 11:27 AM EST RP  Workstation: [id number]   CT Head Wo Contrast Result Date: 10/20/2024 EXAM: CT HEAD WITHOUT CONTRAST 10/20/2024 10:20:00 AM TECHNIQUE: CT of the head was performed without the administration of intravenous contrast. Automated exposure control, iterative reconstruction, and/or weight based adjustment of the mA/kV was utilized to reduce the radiation dose to as low as reasonably achievable. COMPARISON: CT head without contrast 10/06/2022. CLINICAL HISTORY: Minor head trauma in a patient aged 69 years or older. FINDINGS: BRAIN AND VENTRICLES: No acute hemorrhage. No evidence of acute infarct. Remote right cerebellar infarct. Patchy periventricular and subcortical white matter low-density changes compatible with chronic microvascular ischemic change demonstrate slight interval change. Mild-moderate diffuse cerebral volume loss, which has slightly progressed. No hydrocephalus. No extra-axial collection. No mass effect or midline shift. ORBITS: No acute abnormality. SINUSES: No acute abnormality. SOFT TISSUES AND SKULL: Soft tissue swelling over the right parietal region. No acute fracture or radiopaque foreign body is present. Atherosclerotic calcifications in skull base large vessels. IMPRESSION: 1. No acute intracranial abnormality. 2. Soft tissue swelling over the right parietal region without acute fracture or radiopaque foreign body. 3. Slight interval change in chronic microvascular ischemic white matter changes. 4. Slightly progressed mild to moderate diffuse cerebral volume loss. Electronically signed by: Lonni Necessary MD 10/20/2024 11:11 AM EST RP Workstation: [id number]   CT Cervical Spine Wo Contrast Result Date: 10/20/2024 EXAM: CT CERVICAL SPINE WITHOUT CONTRAST 10/20/2024 10:20:00 AM TECHNIQUE: CT of the cervical spine was performed without the administration of intravenous contrast. Multiplanar reformatted images are provided for review. Automated exposure control, iterative reconstruction,  and/or weight based adjustment of the mA/kV was utilized to reduce the radiation dose to as low as reasonably achievable. COMPARISON: None available. CLINICAL HISTORY: Neck trauma (Age >= 65y). Multiple falls in the last 48 hours. FINDINGS: BONES AND ALIGNMENT: No acute fracture or traumatic malalignment. DEGENERATIVE CHANGES: Multilevel facet degenerative changes are present. Severe left foraminal stenosis C6-C7 is secondary to combination of uncovertebral and facet hypertrophy. Moderate right foraminal narrowing is present at the same level. SOFT TISSUES: Subcutaneous emphysema is present within the left paraspinous muscles and subjacent to the left trapezius muscle. Gas also tracks in the left neck and prevertebral space extending to the upper mediastinum. VASCULATURE: Mild atherosclerotic changes are present at the carotid bifurcation. LUNGS: There is small left apical pneumothorax is present. IMPRESSION: 1. Small left apical pneumothorax and subcutaneous emphysema within the left paraspinous muscles and subjacent to the left trapezius muscle, with gas  tracking in the left neck and prevertebral space extending to the upper mediastinum. 2. Severe left foraminal stenosis at C6-C7 secondary to uncovertebral and facet hypertrophy. 3. No acute abnormality of the osseous cervical spine Electronically signed by: Lonni Necessary MD 10/20/2024 11:09 AM EST RP Workstation: [id number]     .Critical Care  Performed by: Lorelle Aleck BROCKS, PA-C Authorized by: Lorelle Aleck BROCKS, PA-C   Critical care provider statement:    Critical care time (minutes):  33   Critical care was necessary to treat or prevent imminent or life-threatening deterioration of the following conditions:  Trauma   Critical care was time spent personally by me on the following activities:  Development of treatment plan with patient or surrogate, discussions with consultants, evaluation of patient's response to treatment, examination of  patient, ordering and review of laboratory studies, ordering and review of radiographic studies, ordering and performing treatments and interventions, pulse oximetry, re-evaluation of patient's condition and review of old charts   I assumed direction of critical care for this patient from another provider in my specialty: no     Care discussed with: admitting provider      Medications Ordered in the ED  acetaminophen  (TYLENOL ) tablet 650 mg (650 mg Oral Given 10/20/24 1049)  lactated ringers  bolus 1,000 mL (1,000 mLs Intravenous New Bag/Given 10/20/24 1214)    Clinical Course as of 10/20/24 1304  Wed Oct 20, 2024  1109 WBC(!): 20.0 [CA]  1111 Leukocytosis of 20,000.  Added lactic acid and blood cultures.  Possibly reactive from fall and CT imaging showing some rib fractures.  Awaiting official read.  IV fluids started. [CA]  1216 Creatinine(!): 1.53 aki [SG]  1217 He has small ptx, likely traumatic, no hypoxia. Will place on supplemental O2 [SG]  1219 CT shows rib fracture with small PTX. Patient placed on [place] 2L [CA]  1246 pH, Ven: 7.339 [CA]    Clinical Course User Index [CA] Lorelle Aleck BROCKS, PA-C [SG] Elnor Jayson LABOR, DO                                 Medical Decision Making Amount and/or Complexity of Data Reviewed Independent Historian:     Details: [facility] staff External Data Reviewed: notes. Labs: ordered. Decision-making details documented in ED Course. Radiology: ordered and independent interpretation performed. Decision-making details documented in ED Course. ECG/medicine tests: ordered and independent interpretation performed. Decision-making details documented in ED Course.  Risk OTC drugs. Decision regarding hospitalization.   This patient presents to the ED for concern of fall, this involves an extensive number of treatment options, and is a complaint that carries with it a high risk of complications and morbidity.  The differential diagnosis includes  intracranial bleed, bony fracture, dehydration, electrolyte abnormality, etc  74 year old male presents to the ED after 3 falls within the past 48 hours.  Resides at [facility] assisted living.  Fall this morning was witnessed by staff where he tripped and fell backwards hitting the posterior aspect of his head.  No LOC.  Not on any blood thinners.  Patient typically ambulates with a walker.  Upon arrival, vitals all within normal limits.  Patient in no acute distress.  Patient does have a hematoma to right posterior head.  No cervical midline tenderness.  Mild thoracic and lumbar midline tenderness.  Able to move all 4 extremities without difficulty.  No neurological deficits.  CT head, cervical spine, thoracic, and lumbar spine  ordered.  Chest x-ray and pelvis x-ray ordered.  Routine labs and UA given 3 recent falls.  CBC with leukocytosis at 20.  Added lactic acid and blood cultures however, upon reviewing CT imaging patient does have some rib fractures so could be reactive.  No source of infection found yet.  BMP with elevated creatinine at 1.53 and BUN at 25.  Hyperglycemia 226.  Anion gap of 17 bicarb 19.  Added VBG.  Could be starvation ketosis however, will rule out DKA with VBG.  RVP negative.  CT images personally reviewed and interpreted which is negative for any intracranial bleed.  CT cervical spine shows a small left pneumothorax and a pneumomediastinum.  Patient placed on nasal cannula.  Acute left rib fractures 6th and 7th rib.  Left renal staghorn measuring 2.9 cm.  Lactic acid elevated. VBG with normal ph. CT chest has no evidence of pneumonia.  UA negative for signs of infection.  RVP negative.  Lower suspicion for sepsis so we will hold off on broad-spectrum antibiotics at this time.  Suspect leukocytosis and elevated lactic acid likely related to trauma. Discussed with Dr. Elnor who agrees with assessment and plan.   Given new pneumothorax and multiple rib fractures patient will require  admission.  Discussed with Dr. Earley with [facility] who would like me to speak to trauma surgery due to concerns about pneumothorax.  Discussed with Dr. Rubin with trauma surgery who recommends ED to ED transfer so patient can be evaluated by trauma service.  Dr. Dreama accepts transfer of patient to [facility] ED.  Co morbidities that complicate the patient evaluation  HTN Cardiac Monitoring: / EKG:  The patient was maintained on a cardiac monitor.  I personally viewed and interpreted the cardiac monitored which showed an underlying rhythm of: NSR, HR 92  Social Determinants of Health:  Lives in assisted living  Test / Admission - Considered:  Admit for pneumothorax to either trauma service versus hospitalist pending evaluation by trauma surgery at [facility], ED      Final diagnoses:  Injury of head, initial encounter  Closed fracture of multiple ribs of left side, initial encounter  Traumatic pneumothorax, initial encounter    ED Discharge Orders     None          Lorelle Aleck BROCKS, PA-C 10/20/24 1439    Elnor Jayson LABOR, DO 10/21/24 0845  "

## 2024-10-20 NOTE — ED Notes (Signed)
 Secure chat sent to Trauma and ED provider regarding pt's change in status

## 2024-10-20 NOTE — Consult Note (Addendum)
 "    Jaime Lopez 1951-09-27  [id number].    Requesting MD: Elnor, MD Chief Complaint/Reason for Consult: GLF, rib fracture, pneumothorax  HPI:  Jaime Lopez is a 74 y/o M with PMH listed below who presented from [facility] after a witnessed GLF. He tells me that he tripped over his feet today, landing on his left back, denies LOC. Reports two falls yesterday, once he was walking with his walker when he was bumped by another resident, falling to the ground, denies LOC. Fell again yesterday AM - holding walker with left hand, coffee cup in right hand, walker got away from him and he fell forward. Denies hitting his head or LOC. States all falls were witnessed. His cc is left posterior chest wall pain that is worse with deep breathing. Denies the use of blood thinners.   Substance history: denies tobacco, alcohol, or drug use States his brother is aware of his falls/going to hospital.  ROS: Review of Systems  All other systems reviewed and are negative.   History reviewed. No pertinent family history.  Past Medical History:  Diagnosis Date   Diabetes mellitus without complication (HCC)    Diastolic heart failure (HCC)    Hyperlipidemia    Hypertension    Prostate disorder    Schizophrenia (HCC)    Seizures (HCC)     History reviewed. No pertinent surgical history.  Social History:  reports that he has never smoked. He has never used smokeless tobacco. He reports that he does not drink alcohol and does not use drugs.  Allergies: Allergies[1]  (Not in a hospital admission)    Physical Exam: Blood pressure (!) 108/59, pulse 96, temperature 98.1 F (36.7 C), temperature source Oral, resp. rate 14, height 5' 10 (1.778 m), weight 90.3 kg, SpO2 98%. General: WDWN male laying on EMS stretcher, appears stated age, NAD. HEENT: head -normocephalic, scalp contusion right parietal scalp - hemostatic. No laceration. Eyes: PERRLA, no conjunctival injection; Ears- no external  lesions or tenderness, mucous membranes pink and moist Neck- Trachea is midline, no thyromegaly  CV- RRR, normal S1/S2, no M/R/G, no lower extremity edema Chest wall - appropriately tender right posterior chest wall, palpable subcutaneous emphysema lateral chest wall and neck.  Pulm- breathing is non-labored 2 L [place]. CTABL, no wheezes, rhales, rhonchi Abd- soft, NT/ND, no masses, hernias, or organomegaly. GU- deferred  MSK- UE/LE symmetrical. No deformity.  Neuro- CN II-XII grossly in tact, non focal exam Psych- Alert and Oriented x3 with appropriate affect Skin: warm and dry, no rashes or lesions   Results for orders placed or performed during the hospital encounter of 10/20/24 (from the past 48 hours)  CBC with Differential     Status: Abnormal   Collection Time: 10/20/24  9:37 AM  Result Value Ref Range   WBC 20.0 (H) 4.0 - 10.5 K/uL   RBC 4.26 4.22 - 5.81 MIL/uL   Hemoglobin 12.5 (L) 13.0 - 17.0 g/dL   HCT 62.0 (L) 60.9 - 47.9 %   MCV 89.0 80.0 - 100.0 fL   MCH 29.3 26.0 - 34.0 pg   MCHC 33.0 30.0 - 36.0 g/dL   RDW 85.7 88.4 - 84.4 %   Platelets 393 150 - 400 K/uL   nRBC 0.0 0.0 - 0.2 %   Neutrophils Relative % 89 %   Neutro Abs 17.7 (H) 1.7 - 7.7 K/uL   Lymphocytes Relative 4 %   Lymphs Abs 0.8 0.7 - 4.0 K/uL   Monocytes Relative 6 %  Monocytes Absolute 1.2 (H) 0.1 - 1.0 K/uL   Eosinophils Relative 0 %   Eosinophils Absolute 0.0 0.0 - 0.5 K/uL   Basophils Relative 0 %   Basophils Absolute 0.0 0.0 - 0.1 K/uL   Immature Granulocytes 1 %   Abs Immature Granulocytes 0.18 (H) 0.00 - 0.07 K/uL    Comment: Performed at [facility], [address] [place], [place] [address]  Basic metabolic panel     Status: Abnormal   Collection Time: 10/20/24  9:37 AM  Result Value Ref Range   Sodium 138 135 - 145 mmol/L   Potassium 4.5 3.5 - 5.1 mmol/L   Chloride 102 98 - 111 mmol/L   CO2 19 (L) 22 - 32 mmol/L   Glucose, Bld 226 (H) 70 - 99 mg/dL    Comment: Glucose  reference range applies only to samples taken after fasting for at least 8 hours.   BUN 25 (H) 8 - 23 mg/dL   Creatinine, Ser 8.46 (H) 0.61 - 1.24 mg/dL   Calcium  9.8 8.9 - 10.3 mg/dL   GFR, Estimated 48 (L) >60 mL/min    Comment: (NOTE) Calculated using the CKD-EPI Creatinine Equation (2021)    Anion gap 17 (H) 5 - 15    Comment: Performed at [facility], [address] [place], [place] [address]  Urinalysis, Routine w reflex microscopic -Urine, Clean Catch     Status: Abnormal   Collection Time: 10/20/24  9:37 AM  Result Value Ref Range   Color, Urine YELLOW YELLOW   APPearance CLEAR CLEAR   Specific Gravity, Urine >=1.030 1.005 - 1.030   pH 5.5 5.0 - 8.0   Glucose, UA 100 (A) NEGATIVE mg/dL   Hgb urine dipstick NEGATIVE NEGATIVE   Bilirubin Urine SMALL (A) NEGATIVE   Ketones, ur NEGATIVE NEGATIVE mg/dL   Protein, ur 899 (A) NEGATIVE mg/dL   Nitrite NEGATIVE NEGATIVE   Leukocytes,Ua NEGATIVE NEGATIVE    Comment: Performed at [facility], [address] [place], [place] [address]  Urinalysis, Microscopic (reflex)     Status: Abnormal   Collection Time: 10/20/24  9:37 AM  Result Value Ref Range   RBC / HPF 0-5 0 - 5 RBC/hpf   WBC, UA 0-5 0 - 5 WBC/hpf   Bacteria, UA FEW (A) NONE SEEN   Squamous Epithelial / HPF 0-5 0 - 5 /HPF   Mucus PRESENT    Hyaline Casts, UA PRESENT    Granular Casts, UA PRESENT     Comment: Performed at [facility], [address] [place], [place] [address]  Resp panel by RT-PCR (RSV, Flu A&B, Covid) Anterior Nasal Swab     Status: None   Collection Time: 10/20/24  9:40 AM   Specimen: Anterior Nasal Swab  Result Value Ref Range   SARS Coronavirus 2 by RT PCR NEGATIVE NEGATIVE    Comment: (NOTE) SARS-CoV-2 target nucleic acids are NOT DETECTED.  The SARS-CoV-2 RNA is generally detectable in upper respiratory specimens during the acute phase of infection. The lowest concentration of SARS-CoV-2 viral copies this  assay can detect is 138 copies/mL. A negative result does not preclude SARS-Cov-2 infection and should not be used as the sole basis for treatment or other patient management decisions. A negative result may occur with  improper specimen collection/handling, submission of specimen other than nasopharyngeal swab, presence of viral mutation(s) within the areas targeted by this assay, and inadequate number of viral  copies(<138 copies/mL). A negative result must be combined with clinical observations, patient history, and epidemiological information. The expected result is Negative.  Fact Sheet for Patients:  bloggercourse.com  Fact Sheet for Healthcare Providers:  seriousbroker.it  This test is no t yet approved or cleared by the [place]  FDA and  has been authorized for detection and/or diagnosis of SARS-CoV-2 by FDA under an Emergency Use Authorization (EUA). This EUA will remain  in effect (meaning this test can be used) for the duration of the COVID-19 declaration under Section 564(b)(1) of the Act, 21 U.S.C.section 360bbb-3(b)(1), unless the authorization is terminated  or revoked sooner.       Influenza A by PCR NEGATIVE NEGATIVE   Influenza B by PCR NEGATIVE NEGATIVE    Comment: (NOTE) The Xpert Xpress SARS-CoV-2/FLU/RSV plus assay is intended as an aid in the diagnosis of influenza from Nasopharyngeal swab specimens and should not be used as a sole basis for treatment. Nasal washings and aspirates are unacceptable for Xpert Xpress SARS-CoV-2/FLU/RSV testing.  Fact Sheet for Patients: bloggercourse.com  Fact Sheet for Healthcare Providers: seriousbroker.it  This test is not yet approved or cleared by the [place]  FDA and has been authorized for detection and/or diagnosis of SARS-CoV-2 by FDA under an Emergency Use Authorization (EUA). This EUA will remain in  effect (meaning this test can be used) for the duration of the COVID-19 declaration under Section 564(b)(1) of the Act, 21 U.S.C. section 360bbb-3(b)(1), unless the authorization is terminated or revoked.     Resp Syncytial Virus by PCR NEGATIVE NEGATIVE    Comment: (NOTE) Fact Sheet for Patients: bloggercourse.com  Fact Sheet for Healthcare Providers: seriousbroker.it  This test is not yet approved or cleared by the [place]  FDA and has been authorized for detection and/or diagnosis of SARS-CoV-2 by FDA under an Emergency Use Authorization (EUA). This EUA will remain in effect (meaning this test can be used) for the duration of the COVID-19 declaration under Section 564(b)(1) of the Act, 21 U.S.C. section 360bbb-3(b)(1), unless the authorization is terminated or revoked.  Performed at [facility], [address] [place], [place] [address]   Lactic acid, plasma     Status: Abnormal   Collection Time: 10/20/24 11:10 AM  Result Value Ref Range   Lactic Acid, Venous 3.9 (HH) 0.5 - 1.9 mmol/L    Comment: RBV ROBIN BROWN @1307  ON 20 Oct 2024. CLA Performed at [facility], [address] [place], [place] [address]   I-Stat venous blood gas, [facility], [facility], [facility])     Status: Abnormal   Collection Time: 10/20/24 12:38 PM  Result Value Ref Range   pH, Ven 7.339 7.25 - 7.43   pCO2, Ven 34.7 (L) 44 - 60 mmHg   pO2, Ven 32 32 - 45 mmHg   Bicarbonate 18.7 (L) 20.0 - 28.0 mmol/L   TCO2 20 (L) 22 - 32 mmol/L   O2 Saturation 59 %   Acid-base deficit 6.0 (H) 0.0 - 2.0 mmol/L   Sodium 138 135 - 145 mmol/L   Potassium 3.9 3.5 - 5.1 mmol/L   Calcium , Ion 1.24 1.15 - 1.40 mmol/L   HCT 35.0 (L) 39.0 - 52.0 %   Hemoglobin 11.9 (L) 13.0 - 17.0 g/dL   Sample type VENOUS    Comment VALUES EXPECTED, NO REPEAT    CT CHEST ABDOMEN PELVIS WO CONTRAST Result Date: 10/20/2024 EXAM: CT CHEST, ABDOMEN AND PELVIS WITHOUT  CONTRAST 10/20/2024 10:39:00 AM TECHNIQUE: CT  of the chest, abdomen and pelvis was performed without the administration of intravenous contrast. Multiplanar reformatted images are provided for review. Automated exposure control, iterative reconstruction, and/or weight based adjustment of the mA/kV was utilized to reduce the radiation dose to as low as reasonably achievable. COMPARISON: None available. CLINICAL HISTORY: Back trauma, no prior imaging (Age >= 16y) The patient experienced back trauma and has not had any prior imaging. The patient is 40 years of age or older. FINDINGS: CHEST: MEDIASTINUM AND LYMPH NODES: Heart and pericardium are unremarkable. Extensive 3-vessel coronary calcifications. The central airways are clear. Posterior and left deep neck emphysema with some extension into the mediastinum. No mediastinal, hilar or axillary lymphadenopathy. LUNGS AND PLEURA: Mild dependent atelectasis posteriorly in the lower lobes. Small left pneumothorax anteriorly at the lung base. Trace left pleural effusion. No focal consolidation or pulmonary edema. ABDOMEN AND PELVIS: LIVER: Unremarkable. GALLBLADDER AND BILE DUCTS: Unremarkable. No biliary ductal dilatation. SPLEEN: No acute abnormality. PANCREAS: Pancreatic parenchymal atrophy without ductal dilatation or mass. ADRENAL GLANDS: 3.4 cm left adrenal adenoma as described on prior MR of 11/07/21. KIDNEYS, URETERS AND BLADDER: Left urolithiasis with a large central 2.9 cm calculus, no hydronephrosis. No perinephric or periureteral stranding. Urinary bladder is unremarkable. GI AND BOWEL: Stomach demonstrates no acute abnormality. Moderate gaseous distention of the colon. Normal appendix. There is no bowel obstruction. REPRODUCTIVE ORGANS: Mild prostate enlargement with central coarse calcification. PERITONEUM AND RETROPERITONEUM: No ascites. No free air. VASCULATURE: Aorta is normal in caliber. Heavy aortic iliac calcified plaque without triple A. ABDOMINAL AND  PELVIS LYMPH NODES: No lymphadenopathy. BONES AND SOFT TISSUES: Subcutaneous gas in the deep tissues of the posterior and lateral left thorax and upper abdomen. Fixation hardware in the proximal right femur. Old fracture deformity of the anterior right acetabulum. Bridging osteophytosis across multiple contiguous levels from the mid thoracic spine to the thoracolumbar junction. Exuberant anterior endplate spurring at all levels of the lumbar spine. Multiple old healed bilateral rib fracture deformities. Right psoas muscular atrophy. Displaced fracture of the posterior aspect of left sixth rib. No other acute osseous abnormality. IMPRESSION: 1. Displaced fracture of the posterior aspect of the left sixth rib with associated subcutaneous emphysema in the posterior and lateral left thorax and upper abdomen, and small left pneumothorax at the anterior left lung base. 2. Posterior and left deep neck emphysema with extension into the mediastinum. 3. Left urolithiasis with a large central 2.9 cm calculus, without hydronephrosis. Electronically signed by: Katheleen Faes MD 10/20/2024 11:47 AM EST RP Workstation: [id number]   CT L-SPINE NO CHARGE Result Date: 10/20/2024 EXAM: CT OF THE LUMBAR SPINE WITHOUT CONTRAST 10/20/2024 10:39:00 AM TECHNIQUE: CT of the lumbar spine was performed without the administration of intravenous contrast. Multiplanar reformatted images are provided for review. Automated exposure control, iterative reconstruction, and/or weight based adjustment of the mA/kV was utilized to reduce the radiation dose to as low as reasonably achievable. COMPARISON: None available. CLINICAL HISTORY: Multiple falls, back trauma. FINDINGS: BONES AND ALIGNMENT: Normal vertebral body heights. 25 percent chronic appearing anterior wedge compression fracture at L1. Large right eccentric Schmorl's node along the superior endplate of L4. 2 mm grade 1 degenerative retrolisthesis at L2-L3. No acute fracture or suspicious  bone lesion identified in the lumbar spine. No acute subluxation identified. Normal alignment otherwise. DEGENERATIVE FINDINGS: Bridging spurring in the lower thoracic spine and extending down to L2 with anterior articular spurring at L2-L3 and L3-L4. Borderline bilateral foraminal stenosis at L4-L5 due to facet arthropathy and mild disc  bulge. Facet arthropathy at L4-L5. Mild disc bulge at L4-L5. SOFT TISSUES: Abdominal aortic atherosclerosis with prominent atheromatous plaque proximally in the superior mesenteric artery. Left renal staghorn calculi including a 2.9 cm calculus in the left renal collecting system. Small amount of subcutaneous gas along the left posterior paraspinal musculature. Atrophic right iliacus muscle. Mild relative atrophy of the right psoas muscle compared to the left. Bridging anterior spurring of both sacroiliac joints. L5: Unremarkable. IMPRESSION: 1. No CT evidence of acute lumbar spine fracture or acute subluxation. 2. Small amount of subcutaneous gas along the left posterior paraspinal musculature. 3. Chronic 25% anterior wedge compression fracture at L1. 4. Large right eccentric Schmorl node along the superior endplate of L4. 5. 2 mm grade 1 degenerative retrolisthesis at L2-3. 6. Degenerative findings without overt impingement at lower thoracic spine to L2, L2-3, L3-4, L4-5. 7. Bridging anterior spurring of both sacroiliac joints. 8. Left renal staghorn calculus, including a 2.9 cm calculus in the left renal collecting system. 9. Abdominal aortic atherosclerosis with severe atheromatous plaque proximally in the superior mesenteric artery. 10. Atrophic right iliacus muscle and mild relative atrophy of the right psoas muscle compared to the left. Electronically signed by: Ryan Salvage MD 10/20/2024 11:33 AM EST RP Workstation: [id number]   CT Thoracic Spine Wo Contrast Result Date: 10/20/2024 EXAM: CT THORACIC SPINE WITHOUT CONTRAST 10/20/2024 10:39:00 AM TECHNIQUE: CT of the  thoracic spine was performed without the administration of intravenous contrast. Multiplanar reformatted images are provided for review. Automated exposure control, iterative reconstruction, and/or weight based adjustment of the mA/kV was utilized to reduce the radiation dose to as low as reasonably achievable. COMPARISON: Chest radiograph 06/06/2020. CLINICAL HISTORY: Multiple falls of the last 2 days, back trauma. FINDINGS: BONES AND ALIGNMENT: Normal vertebral body heights. Left rib fractures including a mildly comminuted fracture of the left posterior sixth rib with adjacent gas and fluid in the pleural space implying hemopneumothorax. Left medial 7th rib fracture noted. Chronic appearing wedging at multiple thoracic levels including T6, T7, T8, and T9 without a well defined acute thoracic spine fracture. Callus formation along additional lower medial ribs likely from old fractures. Old bilateral rib deformities from healed fractures. Normal alignment. No suspicious bone lesion. DEGENERATIVE CHANGES: Flowing anterior osteophytes in the thoracic spine extending from the T4 level through the lumbar spine noted. Chronic appearing wedging at multiple thoracic levels including T6, T7, T8, and T9 without a well defined acute thoracic spine fracture. No substantial degree of bony impingement identified. SOFT TISSUES: Pneumomediastinum observed in the upper thorax. Adjacent gas and fluid in the pleural space implying hemopneumothorax. Left paraspinal gas tracking along paraspinal musculature along with left subcutaneous emphysema. Suspected atelectasis in both lower lobes. Thoracic aortic, coronary artery, and branch vessel atheromatous vascular calcifications. Atheromatous abdominal aorta and substantial atheromatous plaque proximally in the superior mesenteric artery. Fullness of both adrenal glands. These findings will be further investigated on dedicated chest CT under separate report. IMPRESSION: 1. Acute left rib  fractures, including a mildly comminuted left posterior sixth rib fracture and left medial seventh rib fracture, with adjacent pleural gas and fluid consistent with hemopneumothorax. 2. Pneumomediastinum with left paraspinal gas tracking along the paraspinal musculature and left subcutaneous emphysema. 3. No acute thoracic spine fracture. 4. Bibasilar atelectasis. 5. Chronic-appearing thoracic vertebral body wedging at T6, T7, T8, and T9, with flowing anterior osteophytes extending from T4 through the lumbar spine. 6. Chronic healed rib fracture deformities with callus formation along additional lower medial ribs. 7. Atherosclerotic calcifications involving  the thoracic aorta, coronary arteries, branch vessels, abdominal aorta, and proximal superior mesenteric artery. 8. Acute nonspinal findings to be further investigated with dedicated CT chest. Electronically signed by: Ryan Salvage MD 10/20/2024 11:27 AM EST RP Workstation: [id number]   CT Head Wo Contrast Result Date: 10/20/2024 EXAM: CT HEAD WITHOUT CONTRAST 10/20/2024 10:20:00 AM TECHNIQUE: CT of the head was performed without the administration of intravenous contrast. Automated exposure control, iterative reconstruction, and/or weight based adjustment of the mA/kV was utilized to reduce the radiation dose to as low as reasonably achievable. COMPARISON: CT head without contrast 10/06/2022. CLINICAL HISTORY: Minor head trauma in a patient aged 67 years or older. FINDINGS: BRAIN AND VENTRICLES: No acute hemorrhage. No evidence of acute infarct. Remote right cerebellar infarct. Patchy periventricular and subcortical white matter low-density changes compatible with chronic microvascular ischemic change demonstrate slight interval change. Mild-moderate diffuse cerebral volume loss, which has slightly progressed. No hydrocephalus. No extra-axial collection. No mass effect or midline shift. ORBITS: No acute abnormality. SINUSES: No acute abnormality. SOFT  TISSUES AND SKULL: Soft tissue swelling over the right parietal region. No acute fracture or radiopaque foreign body is present. Atherosclerotic calcifications in skull base large vessels. IMPRESSION: 1. No acute intracranial abnormality. 2. Soft tissue swelling over the right parietal region without acute fracture or radiopaque foreign body. 3. Slight interval change in chronic microvascular ischemic white matter changes. 4. Slightly progressed mild to moderate diffuse cerebral volume loss. Electronically signed by: Lonni Necessary MD 10/20/2024 11:11 AM EST RP Workstation: [id number]   CT Cervical Spine Wo Contrast Result Date: 10/20/2024 EXAM: CT CERVICAL SPINE WITHOUT CONTRAST 10/20/2024 10:20:00 AM TECHNIQUE: CT of the cervical spine was performed without the administration of intravenous contrast. Multiplanar reformatted images are provided for review. Automated exposure control, iterative reconstruction, and/or weight based adjustment of the mA/kV was utilized to reduce the radiation dose to as low as reasonably achievable. COMPARISON: None available. CLINICAL HISTORY: Neck trauma (Age >= 65y). Multiple falls in the last 48 hours. FINDINGS: BONES AND ALIGNMENT: No acute fracture or traumatic malalignment. DEGENERATIVE CHANGES: Multilevel facet degenerative changes are present. Severe left foraminal stenosis C6-C7 is secondary to combination of uncovertebral and facet hypertrophy. Moderate right foraminal narrowing is present at the same level. SOFT TISSUES: Subcutaneous emphysema is present within the left paraspinous muscles and subjacent to the left trapezius muscle. Gas also tracks in the left neck and prevertebral space extending to the upper mediastinum. VASCULATURE: Mild atherosclerotic changes are present at the carotid bifurcation. LUNGS: There is small left apical pneumothorax is present. IMPRESSION: 1. Small left apical pneumothorax and subcutaneous emphysema within the left paraspinous  muscles and subjacent to the left trapezius muscle, with gas tracking in the left neck and prevertebral space extending to the upper mediastinum. 2. Severe left foraminal stenosis at C6-C7 secondary to uncovertebral and facet hypertrophy. 3. No acute abnormality of the osseous cervical spine Electronically signed by: Lonni Necessary MD 10/20/2024 11:09 AM EST RP Workstation: [id number]    Assessment/Plan 74 y/o M s/p GLF x3  Left 6th Rib FX - multimodal pain control, IS/pulm toilet Small left lower lobe PTX, subcutaneous emphysema- CXR in AM, currently on room air Scalp hematoma, right parietal - monitor  CT of the head, c-spine, chest, abdomen, and pelvis otherwise negative for acute injury no additional injuries identified on tertiary survey PT/OT   FEN - ok for regular diet from trauma standpoint VTE - SCD's, Lovenox   ID - no abx indicated from a trauma perspective Admit - TRH  service, trauma surgery will consult  Below per TRH - Leukocytosis AKI - BUN 25/Cr 1.53 Elevated lactate - 3.9 DM Diastolic heart failure HTN HLD Schizophrenia    I reviewed nursing notes, ED provider notes, last 24 h vitals and pain scores, last 48 h intake and output, last 24 h labs and trends, and last 24 h imaging results.  HIGH MDM    Almarie Pringle, [facility] Surgery 10/20/2024, 2:35 PM Please see Amion for pager number during day hours 7:00am-4:30pm or 7:00am -11:30am on weekends     [1] No Known Allergies  "

## 2024-10-20 NOTE — H&P (Signed)
 " History and Physical    Jaime Lopez FMW:[id number] DOB: [birth year] DOA: 10/20/2024  PCP: Maritza Alm HERO, MD  Patient coming from: [facility]  I have personally briefly reviewed patient's old medical records in [facility]  Chief Complaint: Fall, head injury  HPI: Jaime Lopez is a 74 y.o. 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  Patient reports that he resides at [facility].  He states that he ambulates with use of a walker at baseline.  Today he tripped over his feet and fell, landing on his left back.  He had 2 other recent falls as well, once when he was bumped by another resident and again when his walker got away from him.  He denies hitting his head or losing consciousness during any of these falls.  He is having pain in his left back area.  He denies any shortness of breath, chest pain, nausea, vomiting, diarrhea.  He reports fair urine output.  ED Course  Labs/Imaging on admission: I have personally reviewed following labs and imaging studies.  Patient initially presented [facility] ED this morning.  Initial vitals showed BP 122/73, pulse 95, RR 14, temp 98.3 F, SpO2 95% on room air.  Labs showed WBC 20.0, hemoglobin 12.5, platelets 393, sodium 138, potassium 4.5, bicarb 19, BUN 25, creatinine 1.53, serum glucose 226, lactic acid 3.9.  UA showed negative nitrites, negative leukocytes, 0-5 RBCs and WBCs, few bacteria.  SARS-CoV-2, influenza, RSV PCR negative.  Blood cultures collected and pending.  CT head without contrast IMPRESSION: 1. No acute intracranial abnormality. 2. Soft tissue swelling over the right parietal region without acute fracture or radiopaque foreign body. 3. Slight interval change in chronic microvascular ischemic white matter  changes. 4. Slightly progressed mild to moderate diffuse cerebral volume loss.  CT cervical spine without contrast IMPRESSION: 1. Small left apical pneumothorax and subcutaneous emphysema within the left paraspinous muscles and subjacent to the left trapezius muscle, with gas tracking in the left neck and prevertebral space extending to the upper mediastinum. 2. Severe left foraminal stenosis at C6-C7 secondary to uncovertebral and facet hypertrophy. 3. No acute abnormality of the osseous cervical spine  CT thoracic spine without contrast IMPRESSION: 1. Acute left rib fractures, including a mildly comminuted left posterior sixth rib fracture and left medial seventh rib fracture, with adjacent pleural gas and fluid consistent with hemopneumothorax. 2. Pneumomediastinum with left paraspinal gas tracking along the paraspinal musculature and left subcutaneous emphysema. 3. No acute thoracic spine fracture. 4. Bibasilar atelectasis. 5. Chronic-appearing thoracic vertebral body wedging at T6, T7, T8, and T9, with flowing anterior osteophytes extending from T4 through the lumbar spine. 6. Chronic healed rib fracture deformities with callus formation along additional lower medial ribs. 7. Atherosclerotic calcifications involving the thoracic aorta, coronary arteries, branch vessels, abdominal aorta, and proximal superior mesenteric artery. 8. Acute nonspinal findings to be further investigated with dedicated CT chest.  CT L-spine IMPRESSION: 1. No CT evidence of acute lumbar spine fracture or acute subluxation. 2. Small amount of subcutaneous gas along the left posterior paraspinal musculature. 3. Chronic 25% anterior wedge compression fracture at L1. 4. Large right eccentric Schmorl node along the superior endplate of L4. 5. 2 mm grade 1 degenerative retrolisthesis at L2-3. 6. Degenerative findings without overt impingement at lower thoracic spine to L2, L2-3, L3-4, L4-5. 7.  Bridging anterior  spurring of both sacroiliac joints. 8. Left renal staghorn calculus, including a 2.9 cm calculus in the left renal collecting system. 9. Abdominal aortic atherosclerosis with severe atheromatous plaque proximally in the superior mesenteric artery. 10. Atrophic right iliacus muscle and mild relative atrophy of the right psoas muscle compared to the left.  CT chest/abdomen/pelvis without contrast IMPRESSION: 1. Displaced fracture of the posterior aspect of the left sixth rib with associated subcutaneous emphysema in the posterior and lateral left thorax and upper abdomen, and small left pneumothorax at the anterior left lung base. 2. Posterior and left deep neck emphysema with extension into the mediastinum. 3. Left urolithiasis with a large central 2.9 cm calculus, without hydronephrosis.  EDP discussed with trauma surgery who recommended ED to ED transfer to [facility].  Trauma surgery has evaluated patient in consultation and recommended medical admission.  Patient was given 2 L LR.  The hospitalist service was consulted for admission.  Review of Systems: All systems reviewed and are negative except as documented in history of present illness above.   Past Medical History:  Diagnosis Date   Diabetes mellitus without complication (HCC)    Diastolic heart failure (HCC)    Hyperlipidemia    Hypertension    Prostate disorder    Schizophrenia (HCC)    Seizures (HCC)     History reviewed. No pertinent surgical history.  Social History: Social History[1]  Allergies[2]  History reviewed. No pertinent family history.   Prior to Admission medications  Medication Sig Start Date End Date Taking? Authorizing Provider  acetaminophen  (TYLENOL ) 325 MG tablet Take 2 tablets (650 mg total) by mouth every 6 (six) hours as needed for mild pain or headache (fever >/= 101). 06/14/20   Danton Reyes DASEN, MD  amLODipine  (NORVASC ) 5 MG tablet Take 5 mg by mouth daily. 07/04/20   [provider]  atorvastatin  (LIPITOR) 10 MG tablet Take 10 mg by mouth daily. 10/11/24   [provider]  benztropine  (COGENTIN ) 0.5 MG tablet Take 0.5 mg by mouth every other day. Takes with supper. 05/26/20   [provider]  fluPHENAZine  (PROLIXIN ) 2.5 MG tablet Take 2.5 mg by mouth daily. 10/15/22   [provider]  gabapentin  (NEURONTIN ) 300 MG capsule Take 300 mg by mouth daily with supper.     [provider]  guaiFENesin -dextromethorphan  (ROBITUSSIN DM) 100-10 MG/5ML syrup Take 5 mLs by mouth every 4 (four) hours as needed for cough. 06/14/20   Danton Reyes DASEN, MD  JANUVIA 100 MG tablet Take 100 mg by mouth daily. 10/06/24   [provider]  levothyroxine  (SYNTHROID ) 75 MCG tablet Take 75 mcg by mouth daily. 10/14/22   [provider]  lisinopril  (PRINIVIL ,ZESTRIL ) 40 MG tablet Take 40 mg by mouth daily with supper.     [provider]  metFORMIN (GLUCOPHAGE) 1000 MG tablet Take 1,000 mg by mouth 2 (two) times daily. 10/06/24   [provider]  polyethylene glycol (MIRALAX  / GLYCOLAX ) 17 g packet Take 17 g by mouth daily as needed for mild constipation. 06/14/20   Danton Reyes DASEN, MD  sitaGLIPtin-metformin (JANUMET) 50-1000 MG tablet Take 1 tablet by mouth 2 (two) times daily with a meal.    [provider]  tamsulosin  (FLOMAX ) 0.4 MG CAPS capsule Take 0.4 mg by mouth daily after supper.  03/24/20   [provider]  traZODone  (DESYREL ) 50 MG tablet Take 50 mg by mouth daily with supper.  06/01/20   [provider]  Physical Exam: Vitals:   10/20/24 1745 10/20/24 1753 10/20/24 1757 10/20/24 1837  BP: (!) 154/97   118/81  Pulse: (!) 113 (!) 115 (!) 113 (!) 120  Resp: (!) 23 (!) 25 19 (!) 28  Temp:    98.7 F (37.1 C)  TempSrc:      SpO2: 95% 96% 98% 94%  Weight:      Height:       Constitutional: Resting in bed with head elevated, NAD, calm, comfortable Eyes: EOMI, lids and conjunctivae  normal ENMT: Mucous membranes are moist. Posterior pharynx clear of any exudate or lesions.Normal dentition.  Neck: normal, supple, no masses. Respiratory: clear to auscultation bilaterally, no wheezing, no crackles. Normal respiratory effort. No accessory muscle use.  Cardiovascular: Regular rate and rhythm, no murmurs / rubs / gallops. No extremity edema. 2+ pedal pulses. Abdomen: no tenderness, no masses palpated. Musculoskeletal: no clubbing / cyanosis. No joint deformity upper and lower extremities.  Tender to palpation over left lateral chest wall Skin: no rashes, lesions, ulcers. No induration Neurologic: Sensation intact. Strength 5/5 in all 4.  Psychiatric: Alert and oriented x 3. Normal mood.   EKG: Personally reviewed. Sinus rhythm, rate 92, RBBB.  Similar to previous.  Assessment/Plan Principal Problem:   Closed traumatic fracture of ribs of left side with pneumothorax Active Problems:   Renal insufficiency   Insulin  dependent type 2 diabetes mellitus (HCC)   Hyperlipidemia associated with type 2 diabetes mellitus (HCC)   Hypertension associated with diabetes (HCC)   Schizophrenia (HCC)   Hypothyroidism   BPH (benign prostatic hyperplasia)   Jaime Lopez is a 74 y.o. 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  Assessment and Plan: Acute left sixth rib fracture Small left pneumothorax left lower lobe with subcutaneous emphysema: Occurring after mechanical fall.  Trauma surgery following.  Continue analgesics, incentive spirometer/pulmonary toilet, supplemental oxygen.  Repeat x-ray ordered for a.m.  Renal insufficiency Left urolithiasis: Creatinine 1.53 on admission.  Last labs on file showed creatinine 1.04 09/28/2022.  Presumed AKI.  Imaging showed  left-sided urolithiasis with large central 2.9 cm calculus.  No hydronephrosis noted. - Continue IV fluid hydration overnight - Hold metformin and lisinopril  - Monitor UOP, bladder scan and In-N-Out cath as needed  Lactic acidosis: No obvious active infection.  Blood cultures in process.  BP stable.  Received 2 Ls IV fluid hydration.  Repeat lactic acid pending.  Hold metformin.  Leukocytosis: Likely reactive in setting of traumatic injuries.  As above, no obvious infectious process.  Repeat CBC in AM.  Type 2 diabetes: Holding home meds.  Placed on SSI.  Hypertension: Continue amlodipine .  Holding lisinopril .  Hyperlipidemia: Continue atorvastatin .  Hypothyroidism: Continue Synthroid .  BPH: Continue tamsulosin .  Schizophrenia: Continue fluphenazine .  Secondary parkinsonism: Continue benztropine .   DVT prophylaxis: enoxaparin  (LOVENOX ) injection 40 mg Start: 10/20/24 1945 Code Status:   Code Status: Do not attempt resuscitation (DNR) PRE-ARREST INTERVENTIONS DESIRED discussed with patient on admission Family Communication: None present on admission Disposition Plan: From [facility], dispo pending clinical progress Consults called: Trauma surgery Severity of Illness: The appropriate patient status for this patient is OBSERVATION. Observation status is judged to be reasonable and necessary in order to provide the required intensity of service to ensure the patient's safety. The patient's presenting symptoms, physical exam findings, and initial radiographic and laboratory data  in the context of their medical condition is felt to place them at decreased risk for further clinical deterioration. Furthermore, it is anticipated that the patient will be medically stable for discharge from the hospital within 2 midnights of admission.   Jaime Blanch MD [facility]  If 7PM-7AM, please contact night-coverage [email or web address]  10/20/2024, 8:55 PM      [1]  Social  History Tobacco Use   Smoking status: Never   Smokeless tobacco: Never  Vaping Use   Vaping status: Never Used  Substance Use Topics   Alcohol use: No   Drug use: Never  [2] No Known Allergies  "

## 2024-10-20 NOTE — ED Notes (Signed)
" °   10/20/24 1900  Hygiene  Linen Change Gown changed;Blanket(s) changed   Pt pulled IV out. Pressure bandage applied.   Repositioned pt. Call bell within reach. Stretcher low and locked.   "

## 2024-10-21 ENCOUNTER — Observation Stay (HOSPITAL_COMMUNITY)

## 2024-10-21 DIAGNOSIS — S270XXA Traumatic pneumothorax, initial encounter: Secondary | ICD-10-CM | POA: Diagnosis not present

## 2024-10-21 DIAGNOSIS — S2242XA Multiple fractures of ribs, left side, initial encounter for closed fracture: Secondary | ICD-10-CM | POA: Diagnosis not present

## 2024-10-21 LAB — BLOOD CULTURE ID PANEL (REFLEXED) - BCID2

## 2024-10-21 LAB — CBC
HCT: 33.9 % — ABNORMAL LOW (ref 39.0–52.0)
Hemoglobin: 11.1 g/dL — ABNORMAL LOW (ref 13.0–17.0)
MCH: 29.8 pg (ref 26.0–34.0)
MCHC: 32.7 g/dL (ref 30.0–36.0)
MCV: 91.1 fL (ref 80.0–100.0)
Platelets: 323 K/uL (ref 150–400)
RBC: 3.72 MIL/uL — ABNORMAL LOW (ref 4.22–5.81)
RDW: 14 % (ref 11.5–15.5)
WBC: 18.4 K/uL — ABNORMAL HIGH (ref 4.0–10.5)
nRBC: 0 % (ref 0.0–0.2)

## 2024-10-21 LAB — GLUCOSE, CAPILLARY
Glucose-Capillary: 128 mg/dL — ABNORMAL HIGH (ref 70–99)
Glucose-Capillary: 180 mg/dL — ABNORMAL HIGH (ref 70–99)
Glucose-Capillary: 134 mg/dL — ABNORMAL HIGH (ref 70–99)

## 2024-10-21 LAB — CBG MONITORING, ED: Glucose-Capillary: 111 mg/dL — ABNORMAL HIGH (ref 70–99)

## 2024-10-21 LAB — BASIC METABOLIC PANEL WITH GFR
Anion gap: 13 (ref 5–15)
BUN: 22 mg/dL (ref 8–23)
CO2: 18 mmol/L — ABNORMAL LOW (ref 22–32)
Calcium: 9 mg/dL (ref 8.9–10.3)
Chloride: 104 mmol/L (ref 98–111)
Creatinine, Ser: 1.13 mg/dL (ref 0.61–1.24)
GFR, Estimated: >60 mL/min
Glucose, Bld: 110 mg/dL — ABNORMAL HIGH (ref 70–99)
Potassium: 3.5 mmol/L (ref 3.5–5.1)
Sodium: 135 mmol/L (ref 135–145)

## 2024-10-21 LAB — PROCALCITONIN: Procalcitonin: 0.11 ng/mL

## 2024-10-21 MED ORDER — GUAIFENESIN ER 600 MG PO TB12
600.0000 mg | ORAL_TABLET | Freq: Two times a day (BID) | ORAL | Status: DC
  Administered 2024-10-21 – 2024-10-22 (×3): 600 mg via ORAL
  Filled 2024-10-21 (×3): qty 1

## 2024-10-21 NOTE — ED Notes (Signed)
 Pt refused hospital socks stating he dislike the bumpy feeling.

## 2024-10-21 NOTE — Progress Notes (Signed)
 PHARMACY - PHYSICIAN COMMUNICATION CRITICAL VALUE ALERT - BLOOD CULTURE IDENTIFICATION (BCID)  Jaime Lopez is an 74 y.o. male who presented to [facility] on 10/20/2024 with a chief complaint of fall,  head injury  Assessment:  1/4 blood cx bottles with staph epi - likely contaminant  Name of physician (or Provider) JhonnyBETHA WENDI Cone, NP  Current antibiotics: None  Changes to prescribed antibiotics recommended:  No antibiotics recommended at this time  Results for orders placed or performed during the hospital encounter of 10/20/24  Blood Culture ID Panel (Reflexed) (Collected: 10/20/2024 12:15 PM)  Result Value Ref Range   Enterococcus faecalis NOT DETECTED NOT DETECTED   Enterococcus Faecium NOT DETECTED NOT DETECTED   Listeria monocytogenes NOT DETECTED NOT DETECTED   Staphylococcus species DETECTED (A) NOT DETECTED   Staphylococcus aureus (BCID) NOT DETECTED NOT DETECTED   Staphylococcus epidermidis DETECTED (A) NOT DETECTED   Staphylococcus lugdunensis NOT DETECTED NOT DETECTED   Streptococcus species NOT DETECTED NOT DETECTED   Streptococcus agalactiae NOT DETECTED NOT DETECTED   Streptococcus pneumoniae NOT DETECTED NOT DETECTED   Streptococcus pyogenes NOT DETECTED NOT DETECTED   A.calcoaceticus-baumannii NOT DETECTED NOT DETECTED   Bacteroides fragilis NOT DETECTED NOT DETECTED   Enterobacterales NOT DETECTED NOT DETECTED   Enterobacter cloacae complex NOT DETECTED NOT DETECTED   Escherichia coli NOT DETECTED NOT DETECTED   Klebsiella aerogenes NOT DETECTED NOT DETECTED   Klebsiella oxytoca NOT DETECTED NOT DETECTED   Klebsiella pneumoniae NOT DETECTED NOT DETECTED   Proteus species NOT DETECTED NOT DETECTED   Salmonella species NOT DETECTED NOT DETECTED   Serratia marcescens NOT DETECTED NOT DETECTED   Haemophilus influenzae NOT DETECTED NOT DETECTED   Neisseria meningitidis NOT DETECTED NOT DETECTED   Pseudomonas aeruginosa NOT DETECTED NOT DETECTED    Stenotrophomonas maltophilia NOT DETECTED NOT DETECTED   Candida albicans NOT DETECTED NOT DETECTED   Candida auris NOT DETECTED NOT DETECTED   Candida glabrata NOT DETECTED NOT DETECTED   Candida krusei NOT DETECTED NOT DETECTED   Candida parapsilosis NOT DETECTED NOT DETECTED   Candida tropicalis NOT DETECTED NOT DETECTED   Cryptococcus neoformans/gattii NOT DETECTED NOT DETECTED   Methicillin resistance mecA/C NOT DETECTED NOT DETECTED    Vito Ralph, PharmD, BCPS Please see amion for complete clinical pharmacist phone list 10/21/2024  8:01 PM

## 2024-10-21 NOTE — Evaluation (Signed)
 Physical Therapy Evaluation Patient Details Name: Jaime Lopez MRN: [id number] DOB: 1951-08-06 Today's Date: 10/21/2024  History of Present Illness  74 y.o. male presents to [facility] 10/20/24 from ALF after tripping and falling. Pt with acute L 6th rib fx and small L PTX LLL with subcutaneous emphysema. 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 Pt reported three falls in the past week due to tripping over his feet. Pt presents slightly below mobility baseline with generalized weakness and impaired balance. Pt required ModA for bed mobility likely due to narrow stretcher bed. Pt was then able to stand and ambulate 174ft with use of RW. Pt reported no pain from rib fx with education provided on log roll technique and splinting if necessary. Anticipating pt will progress well for return to ALF with HHPT recommended. Will continue to follow acutely.       If plan is discharge home, recommend the following: A little help with walking and/or transfers;A little help with bathing/dressing/bathroom;Assist for transportation;Help with stairs or ramp for entrance;Assistance with cooking/housework   Can travel by private vehicle    Yes    Equipment Recommendations None recommended by PT     Functional Status Assessment Patient has had a recent decline in their functional status and demonstrates the ability to make significant improvements in function in a reasonable and predictable amount of time.     Precautions / Restrictions Precautions Precautions: Fall Recall of Precautions/Restrictions: Intact Precaution/Restrictions Comments: L 6th rib fx Restrictions Weight Bearing Restrictions Per Provider Order: No      Mobility  Bed Mobility Overal bed mobility: Needs Assistance Bed Mobility: Supine to Sit, Sit to Supine    Supine to sit: Mod assist Sit to  supine: Contact guard assist   General bed mobility comments: assist to raise trunk, head of stretcher up    Transfers Overall transfer level: Needs assistance Equipment used: None Transfers: Sit to/from Stand Sit to Stand: Contact guard assist    General transfer comment: slow to rise, CGA for safety    Ambulation/Gait Ambulation/Gait assistance: Contact guard assist Gait Distance (Feet): 140 Feet Assistive device: Rolling walker (2 wheels) Gait Pattern/deviations: Step-through pattern, Decreased stride length, Shuffle Gait velocity: decr    General Gait Details: low foot clearance with reliance on UE support     Balance Overall balance assessment: Needs assistance Sitting-balance support: No upper extremity supported, Feet supported Sitting balance-Leahy Scale: Fair    Standing balance support: Bilateral upper extremity supported Standing balance-Leahy Scale: Poor       Pertinent Vitals/Pain Pain Assessment Pain Assessment: Faces Faces Pain Scale: Hurts even more Pain Location: back Pain Descriptors / Indicators: Discomfort, Grimacing, Guarding Pain Intervention(s): Limited activity within patient's tolerance, Monitored during session, Repositioned    Home Living Family/patient expects to be discharged to:: Assisted living    Home Equipment: [employer] (2 wheels) Additional Comments: At [facility]    Prior Function Prior Level of Function : Needs assist;History of Falls (last six months)    Mobility Comments: ModI with RW, walks to dining room. Had three falls in the past week ADLs Comments: Assist for bathing and all IADLs     Extremity/Trunk Assessment   Upper Extremity Assessment Upper Extremity Assessment: Defer to OT evaluation    Lower Extremity Assessment Lower Extremity Assessment: Generalized weakness    Cervical / Trunk Assessment Cervical / Trunk Assessment:  Other exceptions (L rib fx)  Communication   Communication Communication: No  apparent difficulties    Cognition Arousal: Alert Behavior During Therapy: Flat affect   PT - Cognitive impairments: No family/caregiver present to determine baseline, Safety/Judgement, Problem solving, Sequencing    Following commands: Intact       Cueing Cueing Techniques: Verbal cues     General Comments General comments (skin integrity, edema, etc.): VSS on RA     PT Assessment Patient needs continued PT services  PT Problem List Decreased strength;Decreased activity tolerance;Decreased balance;Decreased mobility;Decreased cognition;Decreased knowledge of use of DME;Decreased safety awareness       PT Treatment Interventions DME instruction;Gait training;Functional mobility training;Therapeutic activities;Therapeutic exercise;Balance training;Neuromuscular re-education;Patient/family education    PT Goals (Current goals can be found in the Care Plan section)  Acute Rehab PT Goals Patient Stated Goal: to go home PT Goal Formulation: With patient Time For Goal Achievement: 11/04/24 Potential to Achieve Goals: Good    Frequency Min 2X/week     Co-evaluation PT/OT/SLP Co-Evaluation/Treatment: Yes Reason for Co-Treatment: For patient/therapist safety;To address functional/ADL transfers PT goals addressed during session: Mobility/safety with mobility;Balance;Proper use of DME OT goals addressed during session: ADL's and self-care       AM-PAC PT 6 Clicks Mobility  Outcome Measure Help needed turning from your back to your side while in a flat bed without using bedrails?: A Little Help needed moving from lying on your back to sitting on the side of a flat bed without using bedrails?: A Lot Help needed moving to and from a bed to a chair (including a wheelchair)?: A Little Help needed standing up from a chair using your arms (e.g., wheelchair or bedside chair)?: A Little Help needed to walk in hospital room?: A Little Help needed climbing 3-5 steps with a railing? :  A Little 6 Click Score: 17    End of Session Equipment Utilized During Treatment: Gait belt Activity Tolerance: Patient tolerated treatment well Patient left: in bed;with call bell/phone within reach Nurse Communication: Mobility status PT Visit Diagnosis: Unsteadiness on feet (R26.81);Other abnormalities of gait and mobility (R26.89);Muscle weakness (generalized) (M62.81);History of falling (Z91.81)    Time: 1050-1106 PT Time Calculation (min) (ACUTE ONLY): 16 min   Charges:   PT Evaluation $PT Eval Low Complexity: 1 Low   PT General Charges $$ ACUTE PT VISIT: 1 Visit       Kate ORN, PT, DPT Secure Chat Preferred  Rehab Office [phone]   Kate BRAVO Wendolyn 10/21/2024, 12:23 PM

## 2024-10-21 NOTE — Evaluation (Signed)
 Occupational Therapy Evaluation Patient Details Name: Jaime Lopez MRN: [id number] DOB: 06-26-1951 Today's Date: 10/21/2024   History of Present Illness   74 y.o. male presents to [facility] 10/20/24 from ALF after tripping and falling. Pt with acute L 6th rib fx and small L PTX LLL with subcutaneous emphysema. 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 Pt presents with pain, generalized weakness and impaired standing balance requiring RW and CGA. Pt assisted for donning shoes and socks in ED environment for safety. He currently needs set up to total assist for ADLs. Pt is likely to progress well for return to ALF with HHOT recommended.      If plan is discharge home, recommend the following:   A little help with walking and/or transfers;A little help with bathing/dressing/bathroom;Assistance with cooking/housework;Direct supervision/assist for medications management;Direct supervision/assist for financial management;Assist for transportation;Help with stairs or ramp for entrance     Functional Status Assessment   Patient has had a recent decline in their functional status and demonstrates the ability to make significant improvements in function in a reasonable and predictable amount of time.     Equipment Recommendations   None recommended by OT     Recommendations for Other Services         Precautions/Restrictions   Precautions Precautions: Fall Recall of Precautions/Restrictions: Intact     Mobility Bed Mobility Overal bed mobility: Needs Assistance Bed Mobility: Supine to Sit, Sit to Supine     Supine to sit: Mod assist Sit to supine: Contact guard assist   General bed mobility comments: assist to raise trunk, head of stretcher up     Transfers Overall transfer level: Needs assistance Equipment used: None Transfers: Sit to/from Stand Sit to Stand: Contact guard assist           General transfer comment: slow to rise, CGA for safety      Balance Overall balance assessment: Needs assistance   Sitting balance-Leahy Scale: Fair     Standing balance support: Bilateral upper extremity supported Standing balance-Leahy Scale: Poor                             ADL either performed or assessed with clinical judgement   ADL Overall ADL's : Needs assistance/impaired Eating/Feeding: Independent   Grooming: Contact guard assist;Standing   Upper Body Bathing: Minimal assistance;Sitting   Lower Body Bathing: Maximal assistance;Sit to/from stand   Upper Body Dressing : Minimal assistance;Sitting   Lower Body Dressing: Total assistance;Bed level   Toilet Transfer: Contact guard assist;Ambulation;Rolling walker (2 wheels)           Functional mobility during ADLs: Contact guard assist;Rolling walker (2 wheels)       Vision Baseline Vision/History: 1 Wears glasses Ability to See in Adequate Light: 0 Adequate Patient Visual Report: No change from baseline       Perception         Praxis         Pertinent Vitals/Pain Pain Assessment Pain Assessment: Faces Faces Pain Scale: Hurts even more Pain Location: back Pain Descriptors / Indicators: Discomfort, Grimacing, Guarding Pain Intervention(s): Monitored during session, Repositioned     Extremity/Trunk Assessment Upper Extremity Assessment Upper Extremity Assessment: Overall WFL for tasks  assessed   Lower Extremity Assessment Lower Extremity Assessment: Defer to PT evaluation   Cervical / Trunk Assessment Cervical / Trunk Assessment: Other exceptions (L rib fx)   Communication Communication Communication: No apparent difficulties   Cognition Arousal: Alert Behavior During Therapy: Flat affect Cognition: No family/caregiver  present to determine baseline             OT - Cognition Comments: WFL for task assessed                 Following commands: Intact       Cueing  General Comments   Cueing Techniques: Verbal cues      Exercises     Shoulder Instructions      Home Living Family/patient expects to be discharged to:: Assisted living                             Home Equipment: Rolling Walker (2 wheels)   Additional Comments: At [facility]      Prior Functioning/Environment Prior Level of Function : Needs assist;History of Falls (last six months)             Mobility Comments: ModI with RW, walks to dining room. Had three falls in the past week ADLs Comments: Assist for bathing and all IADLs    OT Problem List: Decreased strength;Impaired balance (sitting and/or standing);Decreased safety awareness;Pain   OT Treatment/Interventions: Self-care/ADL training;DME and/or AE instruction;Patient/family education;Balance training;Therapeutic activities      OT Goals(Current goals can be found in the care plan section)   Acute Rehab OT Goals OT Goal Formulation: With patient Time For Goal Achievement: 11/04/24 Potential to Achieve Goals: Good ADL Goals Pt Will Perform Grooming: with supervision;standing Pt Will Perform Lower Body Bathing: with supervision;sit to/from stand Pt Will Perform Lower Body Dressing: with supervision;sit to/from stand Pt Will Transfer to Toilet: with supervision;ambulating Pt Will Perform Toileting - Clothing Manipulation and hygiene: with supervision;sit to/from stand Additional ADL Goal #1: Pt will complete bed mobility with supervision in preparation for ADLs.   OT Frequency:  Min 2X/week    Co-evaluation PT/OT/SLP Co-Evaluation/Treatment: Yes Reason for Co-Treatment: For patient/therapist safety;To address functional/ADL transfers PT goals addressed during session: Mobility/safety with mobility;Balance;Proper use of DME OT goals  addressed during session: ADL's and self-care      AM-PAC OT 6 Clicks Daily Activity     Outcome Measure Help from another person eating meals?: None Help from another person taking care of personal grooming?: A Little Help from another person toileting, which includes using toliet, bedpan, or urinal?: A Little Help from another person bathing (including washing, rinsing, drying)?: A Lot Help from another person to put on and taking off regular upper body clothing?: A Little Help from another person to put on and taking off regular lower body clothing?: Total 6 Click Score: 16   End of Session Equipment Utilized During Treatment: Gait belt;Rolling walker (2 wheels)  Activity Tolerance: Patient tolerated treatment well Patient left: in bed;with call bell/phone within reach  OT Visit Diagnosis: Unsteadiness on feet (R26.81);Other abnormalities of gait and mobility (R26.89);Pain                Time: 1050-1107 OT Time Calculation (min): 17 min Charges:  OT General Charges $OT Visit: 1 Visit OT Evaluation $OT Eval Moderate Complexity: 1 Mod  Mliss HERO, OTR/L Acute Rehabilitation Services Office: [phone]   Kennth Mliss Helling 10/21/2024, 12:06 PM

## 2024-10-21 NOTE — Progress Notes (Signed)
 [facility] Progress Note     Subjective: CC:  NAEO. Reports left posterior chest wall pain - unsure if meds help. States he is taking shallow breaths to prevent pain. No N/V. Asking to eat breakfast.   Objective: Vital signs in last 24 hours: Temp:  [97.7 F (36.5 C)-98.7 F (37.1 C)] 98.7 F (37.1 C) (01/15 0836) Pulse Rate:  [74-120] 100 (01/15 0836) Resp:  [14-28] 20 (01/15 0836) BP: (102-155)/(53-97) 135/59 (01/15 0836) SpO2:  [90 %-99 %] 97 % (01/15 0836) Weight:  [90.3 kg] 90.3 kg (01/14 0917)    Intake/Output from previous day: 01/14 0701 - 01/15 0700 In: 2001.6 [IV Piggyback:2001.6] Out: -  Intake/Output this shift: No intake/output data recorded.  PE: Gen:  Alert, NAD, pleasant and cooperative  Card:  Regular rate and rhythm, no lower extremity edema  Pulm:  Normal effort, rhonchorus with coarse upper airway sounds, no crackles.  Abd: Soft, non-tender, non-distended Skin: warm and dry, no rashes  MSK: moving all 4 extremities, no deformity, no bony tenderness of the shoulders, elbows, hands, hips, knees, or feet bilaterally. NVI Psych: A&Ox3   Lab Results:  Recent Labs    10/20/24 0937 10/20/24 1238 10/21/24 0125  WBC 20.0*  --  18.4*  HGB 12.5* 11.9* 11.1*  HCT 37.9* 35.0* 33.9*  PLT 393  --  323   BMET Recent Labs    10/20/24 0937 10/20/24 1238 10/21/24 0125  NA 138 138 135  K 4.5 3.9 3.5  CL 102  --  104  CO2 19*  --  18*  GLUCOSE 226*  --  110*  BUN 25*  --  22  CREATININE 1.53*  --  1.13  CALCIUM  9.8  --  9.0   PT/INR No results for input(s): LABPROT, INR in the last 72 hours. CMP     Component Value Date/Time   NA 135 10/21/2024 0125   K 3.5 10/21/2024 0125   CL 104 10/21/2024 0125   CO2 18 (L) 10/21/2024 0125   GLUCOSE 110 (H) 10/21/2024 0125   BUN 22 10/21/2024 0125   CREATININE 1.13 10/21/2024 0125   CALCIUM  9.0 10/21/2024 0125   PROT 6.0 (L) 06/12/2020 0518   ALBUMIN 3.2 (L) 06/12/2020 0518   AST 21  06/12/2020 0518   ALT 26 06/12/2020 0518   ALKPHOS 55 06/12/2020 0518   BILITOT 0.4 06/12/2020 0518   GFRNONAA >60 10/21/2024 0125   GFRAA >60 06/12/2020 0518   Lipase  No results found for: LIPASE     Studies/Results: DG CHEST PORT 1 VIEW Result Date: 10/21/2024 EXAM: 1 VIEW(S) XRAY OF THE CHEST 10/21/2024 06:16:00 AM COMPARISON: None available. CLINICAL HISTORY: Pneumothorax, left. FINDINGS: LUNGS AND PLEURA: Low lung volumes. Bibasilar patchy opacities, left greater than right. No radiographically visible pneumothorax identified No pleural effusion. HEART AND MEDIASTINUM: No acute abnormality of the cardiac and mediastinal silhouettes. BONES AND SOFT TISSUES: Multiple acute on chronic left rib fractures. Left chest wall subcutaneous emphysema extending into left neck. IMPRESSION: 1. No radiographically visible pneumothorax identified. 2. Multiple acute on chronic left rib fractures with left chest wall subcutaneous emphysema extending into the left neck. 3. Bibasilar patchy opacities, left greater than right. Electronically signed by: Waddell Calk MD 10/21/2024 06:31 AM EST RP Workstation: [id number]   CT CHEST ABDOMEN PELVIS WO CONTRAST Result Date: 10/20/2024 EXAM: CT CHEST, ABDOMEN AND PELVIS WITHOUT CONTRAST 10/20/2024 10:39:00 AM TECHNIQUE: CT of the chest, abdomen and pelvis was performed without the administration of intravenous contrast. Multiplanar  reformatted images are provided for review. Automated exposure control, iterative reconstruction, and/or weight based adjustment of the mA/kV was utilized to reduce the radiation dose to as low as reasonably achievable. COMPARISON: None available. CLINICAL HISTORY: Back trauma, no prior imaging (Age >= 16y) The patient experienced back trauma and has not had any prior imaging. The patient is 48 years of age or older. FINDINGS: CHEST: MEDIASTINUM AND LYMPH NODES: Heart and pericardium are unremarkable. Extensive 3-vessel coronary  calcifications. The central airways are clear. Posterior and left deep neck emphysema with some extension into the mediastinum. No mediastinal, hilar or axillary lymphadenopathy. LUNGS AND PLEURA: Mild dependent atelectasis posteriorly in the lower lobes. Small left pneumothorax anteriorly at the lung base. Trace left pleural effusion. No focal consolidation or pulmonary edema. ABDOMEN AND PELVIS: LIVER: Unremarkable. GALLBLADDER AND BILE DUCTS: Unremarkable. No biliary ductal dilatation. SPLEEN: No acute abnormality. PANCREAS: Pancreatic parenchymal atrophy without ductal dilatation or mass. ADRENAL GLANDS: 3.4 cm left adrenal adenoma as described on prior MR of 11/07/21. KIDNEYS, URETERS AND BLADDER: Left urolithiasis with a large central 2.9 cm calculus, no hydronephrosis. No perinephric or periureteral stranding. Urinary bladder is unremarkable. GI AND BOWEL: Stomach demonstrates no acute abnormality. Moderate gaseous distention of the colon. Normal appendix. There is no bowel obstruction. REPRODUCTIVE ORGANS: Mild prostate enlargement with central coarse calcification. PERITONEUM AND RETROPERITONEUM: No ascites. No free air. VASCULATURE: Aorta is normal in caliber. Heavy aortic iliac calcified plaque without triple A. ABDOMINAL AND PELVIS LYMPH NODES: No lymphadenopathy. BONES AND SOFT TISSUES: Subcutaneous gas in the deep tissues of the posterior and lateral left thorax and upper abdomen. Fixation hardware in the proximal right femur. Old fracture deformity of the anterior right acetabulum. Bridging osteophytosis across multiple contiguous levels from the mid thoracic spine to the thoracolumbar junction. Exuberant anterior endplate spurring at all levels of the lumbar spine. Multiple old healed bilateral rib fracture deformities. Right psoas muscular atrophy. Displaced fracture of the posterior aspect of left sixth rib. No other acute osseous abnormality. IMPRESSION: 1. Displaced fracture of the posterior aspect  of the left sixth rib with associated subcutaneous emphysema in the posterior and lateral left thorax and upper abdomen, and small left pneumothorax at the anterior left lung base. 2. Posterior and left deep neck emphysema with extension into the mediastinum. 3. Left urolithiasis with a large central 2.9 cm calculus, without hydronephrosis. Electronically signed by: Katheleen Faes MD 10/20/2024 11:47 AM EST RP Workstation: [id number]   CT L-SPINE NO CHARGE Result Date: 10/20/2024 EXAM: CT OF THE LUMBAR SPINE WITHOUT CONTRAST 10/20/2024 10:39:00 AM TECHNIQUE: CT of the lumbar spine was performed without the administration of intravenous contrast. Multiplanar reformatted images are provided for review. Automated exposure control, iterative reconstruction, and/or weight based adjustment of the mA/kV was utilized to reduce the radiation dose to as low as reasonably achievable. COMPARISON: None available. CLINICAL HISTORY: Multiple falls, back trauma. FINDINGS: BONES AND ALIGNMENT: Normal vertebral body heights. 25 percent chronic appearing anterior wedge compression fracture at L1. Large right eccentric Schmorl's node along the superior endplate of L4. 2 mm grade 1 degenerative retrolisthesis at L2-L3. No acute fracture or suspicious bone lesion identified in the lumbar spine. No acute subluxation identified. Normal alignment otherwise. DEGENERATIVE FINDINGS: Bridging spurring in the lower thoracic spine and extending down to L2 with anterior articular spurring at L2-L3 and L3-L4. Borderline bilateral foraminal stenosis at L4-L5 due to facet arthropathy and mild disc bulge. Facet arthropathy at L4-L5. Mild disc bulge at L4-L5. SOFT TISSUES: Abdominal aortic atherosclerosis  with prominent atheromatous plaque proximally in the superior mesenteric artery. Left renal staghorn calculi including a 2.9 cm calculus in the left renal collecting system. Small amount of subcutaneous gas along the left posterior paraspinal  musculature. Atrophic right iliacus muscle. Mild relative atrophy of the right psoas muscle compared to the left. Bridging anterior spurring of both sacroiliac joints. L5: Unremarkable. IMPRESSION: 1. No CT evidence of acute lumbar spine fracture or acute subluxation. 2. Small amount of subcutaneous gas along the left posterior paraspinal musculature. 3. Chronic 25% anterior wedge compression fracture at L1. 4. Large right eccentric Schmorl node along the superior endplate of L4. 5. 2 mm grade 1 degenerative retrolisthesis at L2-3. 6. Degenerative findings without overt impingement at lower thoracic spine to L2, L2-3, L3-4, L4-5. 7. Bridging anterior spurring of both sacroiliac joints. 8. Left renal staghorn calculus, including a 2.9 cm calculus in the left renal collecting system. 9. Abdominal aortic atherosclerosis with severe atheromatous plaque proximally in the superior mesenteric artery. 10. Atrophic right iliacus muscle and mild relative atrophy of the right psoas muscle compared to the left. Electronically signed by: Ryan Salvage MD 10/20/2024 11:33 AM EST RP Workstation: [id number]   CT Thoracic Spine Wo Contrast Result Date: 10/20/2024 EXAM: CT THORACIC SPINE WITHOUT CONTRAST 10/20/2024 10:39:00 AM TECHNIQUE: CT of the thoracic spine was performed without the administration of intravenous contrast. Multiplanar reformatted images are provided for review. Automated exposure control, iterative reconstruction, and/or weight based adjustment of the mA/kV was utilized to reduce the radiation dose to as low as reasonably achievable. COMPARISON: Chest radiograph 06/06/2020. CLINICAL HISTORY: Multiple falls of the last 2 days, back trauma. FINDINGS: BONES AND ALIGNMENT: Normal vertebral body heights. Left rib fractures including a mildly comminuted fracture of the left posterior sixth rib with adjacent gas and fluid in the pleural space implying hemopneumothorax. Left medial 7th rib fracture noted. Chronic  appearing wedging at multiple thoracic levels including T6, T7, T8, and T9 without a well defined acute thoracic spine fracture. Callus formation along additional lower medial ribs likely from old fractures. Old bilateral rib deformities from healed fractures. Normal alignment. No suspicious bone lesion. DEGENERATIVE CHANGES: Flowing anterior osteophytes in the thoracic spine extending from the T4 level through the lumbar spine noted. Chronic appearing wedging at multiple thoracic levels including T6, T7, T8, and T9 without a well defined acute thoracic spine fracture. No substantial degree of bony impingement identified. SOFT TISSUES: Pneumomediastinum observed in the upper thorax. Adjacent gas and fluid in the pleural space implying hemopneumothorax. Left paraspinal gas tracking along paraspinal musculature along with left subcutaneous emphysema. Suspected atelectasis in both lower lobes. Thoracic aortic, coronary artery, and branch vessel atheromatous vascular calcifications. Atheromatous abdominal aorta and substantial atheromatous plaque proximally in the superior mesenteric artery. Fullness of both adrenal glands. These findings will be further investigated on dedicated chest CT under separate report. IMPRESSION: 1. Acute left rib fractures, including a mildly comminuted left posterior sixth rib fracture and left medial seventh rib fracture, with adjacent pleural gas and fluid consistent with hemopneumothorax. 2. Pneumomediastinum with left paraspinal gas tracking along the paraspinal musculature and left subcutaneous emphysema. 3. No acute thoracic spine fracture. 4. Bibasilar atelectasis. 5. Chronic-appearing thoracic vertebral body wedging at T6, T7, T8, and T9, with flowing anterior osteophytes extending from T4 through the lumbar spine. 6. Chronic healed rib fracture deformities with callus formation along additional lower medial ribs. 7. Atherosclerotic calcifications involving the thoracic aorta,  coronary arteries, branch vessels, abdominal aorta, and proximal superior mesenteric artery.  8. Acute nonspinal findings to be further investigated with dedicated CT chest. Electronically signed by: Ryan Salvage MD 10/20/2024 11:27 AM EST RP Workstation: [id number]   CT Head Wo Contrast Result Date: 10/20/2024 EXAM: CT HEAD WITHOUT CONTRAST 10/20/2024 10:20:00 AM TECHNIQUE: CT of the head was performed without the administration of intravenous contrast. Automated exposure control, iterative reconstruction, and/or weight based adjustment of the mA/kV was utilized to reduce the radiation dose to as low as reasonably achievable. COMPARISON: CT head without contrast 10/06/2022. CLINICAL HISTORY: Minor head trauma in a patient aged 46 years or older. FINDINGS: BRAIN AND VENTRICLES: No acute hemorrhage. No evidence of acute infarct. Remote right cerebellar infarct. Patchy periventricular and subcortical white matter low-density changes compatible with chronic microvascular ischemic change demonstrate slight interval change. Mild-moderate diffuse cerebral volume loss, which has slightly progressed. No hydrocephalus. No extra-axial collection. No mass effect or midline shift. ORBITS: No acute abnormality. SINUSES: No acute abnormality. SOFT TISSUES AND SKULL: Soft tissue swelling over the right parietal region. No acute fracture or radiopaque foreign body is present. Atherosclerotic calcifications in skull base large vessels. IMPRESSION: 1. No acute intracranial abnormality. 2. Soft tissue swelling over the right parietal region without acute fracture or radiopaque foreign body. 3. Slight interval change in chronic microvascular ischemic white matter changes. 4. Slightly progressed mild to moderate diffuse cerebral volume loss. Electronically signed by: Lonni Necessary MD 10/20/2024 11:11 AM EST RP Workstation: [id number]   CT Cervical Spine Wo Contrast Result Date: 10/20/2024 EXAM: CT CERVICAL SPINE WITHOUT  CONTRAST 10/20/2024 10:20:00 AM TECHNIQUE: CT of the cervical spine was performed without the administration of intravenous contrast. Multiplanar reformatted images are provided for review. Automated exposure control, iterative reconstruction, and/or weight based adjustment of the mA/kV was utilized to reduce the radiation dose to as low as reasonably achievable. COMPARISON: None available. CLINICAL HISTORY: Neck trauma (Age >= 65y). Multiple falls in the last 48 hours. FINDINGS: BONES AND ALIGNMENT: No acute fracture or traumatic malalignment. DEGENERATIVE CHANGES: Multilevel facet degenerative changes are present. Severe left foraminal stenosis C6-C7 is secondary to combination of uncovertebral and facet hypertrophy. Moderate right foraminal narrowing is present at the same level. SOFT TISSUES: Subcutaneous emphysema is present within the left paraspinous muscles and subjacent to the left trapezius muscle. Gas also tracks in the left neck and prevertebral space extending to the upper mediastinum. VASCULATURE: Mild atherosclerotic changes are present at the carotid bifurcation. LUNGS: There is small left apical pneumothorax is present. IMPRESSION: 1. Small left apical pneumothorax and subcutaneous emphysema within the left paraspinous muscles and subjacent to the left trapezius muscle, with gas tracking in the left neck and prevertebral space extending to the upper mediastinum. 2. Severe left foraminal stenosis at C6-C7 secondary to uncovertebral and facet hypertrophy. 3. No acute abnormality of the osseous cervical spine Electronically signed by: Lonni Necessary MD 10/20/2024 11:09 AM EST RP Workstation: [id number]    Anti-infectives: Anti-infectives (From admission, onward)    None        Assessment/Plan  74 y/o M s/p GLF x3  Left 6th Rib FX - multimodal pain control, IS/pulm toilet. Add mucinex  to help expectorate sputum Small left lower lobe PTX, subcutaneous emphysema- CXR today negative for  PTX, on room air Scalp hematoma, right parietal - stable, hgb stable 11.1 from 12.5 - not unexpected after 2.5 L IVF bolus yesterday + maintenance fluids.  No acute trauma surgery needs. PT/OT. We will sign off. Call as needed. May follow up with PCP.     FEN -  HH/carb mod VTE - SCD's, Lovenox   ID - no abx indicated from a trauma perspective Admit - TRH service,   Below per TRH - Leukocytosis - 18 from 20 AKI - resolved, BUN 22, Cr 1.13 today from BUN 25/Cr 1.53 yesterday.  Elevated lactate - resolved with IVF DM Diastolic heart failure HTN HLD Schizophrenia    LOS: 0 days   I reviewed nursing notes, hospitalist notes, last 24 h vitals and pain scores, last 48 h intake and output, last 24 h labs and trends, and last 24 h imaging results.  This care required straightforward level of medical decision making.   Almarie Pringle, PA-C [facility] Please see Amion for pager number during day hours 7:00am-4:30pm
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
## 2024-10-22 DIAGNOSIS — S270XXA Traumatic pneumothorax, initial encounter: Secondary | ICD-10-CM | POA: Diagnosis not present

## 2024-10-22 DIAGNOSIS — S2242XA Multiple fractures of ribs, left side, initial encounter for closed fracture: Secondary | ICD-10-CM | POA: Diagnosis not present

## 2024-10-22 LAB — BASIC METABOLIC PANEL WITH GFR
Anion gap: 11 (ref 5–15)
BUN: 19 mg/dL (ref 8–23)
CO2: 21 mmol/L — ABNORMAL LOW (ref 22–32)
Calcium: 8.7 mg/dL — ABNORMAL LOW (ref 8.9–10.3)
Chloride: 103 mmol/L (ref 98–111)
Creatinine, Ser: 1.08 mg/dL (ref 0.61–1.24)
GFR, Estimated: >60 mL/min
Glucose, Bld: 111 mg/dL — ABNORMAL HIGH (ref 70–99)
Potassium: 3.4 mmol/L — ABNORMAL LOW (ref 3.5–5.1)
Sodium: 135 mmol/L (ref 135–145)

## 2024-10-22 LAB — GLUCOSE, CAPILLARY
Glucose-Capillary: 200 mg/dL — ABNORMAL HIGH (ref 70–99)
Glucose-Capillary: 115 mg/dL — ABNORMAL HIGH (ref 70–99)

## 2024-10-22 LAB — CBC WITH DIFFERENTIAL/PLATELET
Abs Immature Granulocytes: 0.13 K/uL — ABNORMAL HIGH (ref 0.00–0.07)
Basophils Absolute: 0.1 K/uL (ref 0.0–0.1)
Basophils Relative: 0 %
Eosinophils Absolute: 0.1 K/uL (ref 0.0–0.5)
Eosinophils Relative: 0 %
HCT: 32.2 % — ABNORMAL LOW (ref 39.0–52.0)
Hemoglobin: 11.1 g/dL — ABNORMAL LOW (ref 13.0–17.0)
Immature Granulocytes: 1 %
Lymphocytes Relative: 10 %
Lymphs Abs: 1.6 K/uL (ref 0.7–4.0)
MCH: 30.7 pg (ref 26.0–34.0)
MCHC: 34.5 g/dL (ref 30.0–36.0)
MCV: 89 fL (ref 80.0–100.0)
Monocytes Absolute: 1.7 K/uL — ABNORMAL HIGH (ref 0.1–1.0)
Monocytes Relative: 11 %
Neutro Abs: 12.4 K/uL — ABNORMAL HIGH (ref 1.7–7.7)
Neutrophils Relative %: 78 %
Platelets: 296 K/uL (ref 150–400)
RBC: 3.62 MIL/uL — ABNORMAL LOW (ref 4.22–5.81)
RDW: 14.2 % (ref 11.5–15.5)
WBC: 15.9 K/uL — ABNORMAL HIGH (ref 4.0–10.5)
nRBC: 0 % (ref 0.0–0.2)

## 2024-10-22 MED ORDER — BENZTROPINE MESYLATE 0.5 MG PO TABS: 0.5000 mg | ORAL_TABLET | ORAL | Status: AC

## 2024-10-22 MED ORDER — POTASSIUM CHLORIDE CRYS ER 20 MEQ PO TBCR
40.0000 meq | EXTENDED_RELEASE_TABLET | Freq: Once | ORAL | Status: AC
  Administered 2024-10-22: 40 meq via ORAL
  Filled 2024-10-22: qty 2

## 2024-10-22 MED ORDER — METHOCARBAMOL 500 MG PO TABS: 500.0000 mg | ORAL_TABLET | Freq: Three times a day (TID) | ORAL | 0 refills | Status: AC | PRN

## 2024-10-22 MED ORDER — LIDOCAINE 5 % EX PTCH: 2.0000 | MEDICATED_PATCH | CUTANEOUS | 0 refills | Status: AC

## 2024-10-22 NOTE — Care Management Obs Status (Signed)
 MEDICARE OBSERVATION STATUS NOTIFICATION   Patient Details  Name: Jaime Lopez MRN: [id number] Date of Birth: 1950-12-30   Medicare Observation Status Notification Given:  Yes    [name] 10/22/2024, 12:28 PM

## 2024-10-22 NOTE — TOC Initial Note (Signed)
 Transition of Care (TOC) - Initial/Assessment Note   Spoke to patient at bedside. Confirmed patient is from [facility] and wants to return and requested ambulance transportation .    PT /OT recommending HHPT/OT . Offered choice. Patient would like [facility]) . Jon with [facility] accepted referral.   NCM entered home health orders and face to face and secure chatted MD to sign   SW aware and working with [facility] to transition him home.    Patient Details  Name: Jaime Lopez MRN: [id number] Date of Birth: Oct 31, 1950  Transition of Care [facility]) CM/SW Contact:    Stephane Powell Jansky, RN Phone Number: 10/22/2024, 12:33 PM  Clinical Narrative:                   Expected Discharge Plan: Home w Home Health Services Barriers to Discharge: No Barriers Identified   Patient Goals and CMS Choice Patient states their goals for this hospitalization and ongoing recovery are:: to return to home CMS Medicare.gov Compare Post Acute Care list provided to:: Patient Choice offered to / list presented to : Patient      Expected Discharge Plan and Services   Discharge Planning Services: CM Consult Post Acute Care Choice: Durable Medical Equipment Living arrangements for the past 2 months: Apartment Expected Discharge Date: 10/22/24               DME Arranged: N/A DME Agency: NA       HH Arranged: PT, OT HH Agency: [facility] Date HH Agency Contacted: 10/22/24 Time HH Agency Contacted: 1232 Representative spoke with at [facility] Agency: Jon  Prior Living Arrangements/Services Living arrangements for the past 2 months: Apartment Lives with:: Self Patient language and need for interpreter reviewed:: Yes Do you feel safe going back to the place where you live?: Yes      Need for Family Participation in Patient Care: No (Comment) Care giver support system in place?: Yes (comment) Current home services: DME Criminal  Activity/Legal Involvement Pertinent to Current Situation/Hospitalization: No - Comment as needed  Activities of Daily Living      Permission Sought/Granted   Permission granted to share information with : Yes, Verbal Permission Granted     Permission granted to share info w AGENCY: [facility] , Archivist ( [facility] )        Emotional Assessment Appearance:: Appears stated age Attitude/Demeanor/Rapport: Engaged Affect (typically observed): Appropriate Orientation: : Oriented to Self, Oriented to Place, Oriented to  Time, Oriented to Situation   Psych Involvement: No (comment)  Admission diagnosis:  Traumatic pneumothorax, initial encounter [S27.0XXA] Injury of head, initial encounter [S09.90XA] Closed fracture of multiple ribs of left side, initial encounter [S22.42XA] Closed traumatic fracture of ribs of left side with pneumothorax [S22.42XA, S27.0XXA] Patient Active Problem List   Diagnosis Date Noted   Closed traumatic fracture of ribs of left side with pneumothorax 10/20/2024   Renal insufficiency 10/20/2024   Hypothyroidism 10/20/2024   BPH (benign prostatic hyperplasia) 10/20/2024   COVID-19 virus infection 06/08/2020   Generalized weakness 06/08/2020   Sepsis (HCC) 11/16/2016   Insulin  dependent type 2 diabetes mellitus (HCC) 11/16/2016   Hyperlipidemia associated with type 2 diabetes mellitus (HCC) 11/16/2016   Hypertension associated with diabetes (HCC) 11/16/2016   Obesity, Class II, BMI 35-39.9, with comorbidity 11/16/2016   Schizophrenia (HCC) 11/16/2016   Syncope 11/16/2016   Diarrhea 11/16/2016   Chronic bilateral low back pain without sciatica 02/21/2016  PCP:  Maritza Alm HERO, MD Pharmacy:   [facility] [address] [place], [place] - [address] [place] [address] Phone: [phone] Fax: [phone]  [facility] - [place], [place] - [place] [address] [place] [address] Phone: [phone] Fax: [phone]     Social Drivers of Health (SDOH) Social History: SDOH Screenings   Food Insecurity: No Food Insecurity (10/22/2024)  Housing: Unknown (10/22/2024)  Transportation Needs: No Transportation Needs (10/22/2024)  Utilities: Not At Risk (10/22/2024)  Social Connections: Patient Unable To Answer (10/22/2024)  Tobacco Use: Low Risk (10/20/2024)   SDOH Interventions:     Readmission Risk Interventions     No data to display

## 2024-10-22 NOTE — Progress Notes (Signed)
 Occupational Therapy Treatment Patient Details Name: Jaime Lopez MRN: [id number] DOB: January 10, 1951 Today's Date: 10/22/2024   History of present illness 74 y.o. male presents to [facility] 10/20/24 from ALF after tripping and falling. Pt with acute L 6th rib fx and small L PTX LLL with subcutaneous emphysema. 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 Patient demonstrating gains with bed mobility with min assist. Patient required min assist to donn gown for back and max assist for shoes. Patient asking to perform mobility in hallway and required seated rest break before being able to return to room. Patient asked to return to supine and performed UE HEP to increase activity tolerance. Discharge recommendations continue to be appropriate for HHOT to follow at ALF. Acute OT to continue to follow to address established goals to facilitate DC to next venue of care.        If plan is discharge home, recommend the following:  A little help with walking and/or transfers;A little help with bathing/dressing/bathroom;Assistance with cooking/housework;Direct supervision/assist for medications management;Direct supervision/assist for financial management;Assist for transportation;Help with stairs or ramp for entrance   Equipment Recommendations  None recommended by OT    Recommendations for Other Services      Precautions / Restrictions Precautions Precautions: Fall Recall of Precautions/Restrictions: Intact Precaution/Restrictions Comments: L 6th rib fx Restrictions Weight Bearing Restrictions Per Provider Order: No       Mobility Bed Mobility Overal bed mobility: Needs Assistance Bed Mobility: Supine to Sit, Sit to Supine     Supine to sit: Min assist, HOB elevated, Used rails Sit to supine: Contact guard assist   General bed mobility comments: increased time and  effort with min assist for trunk    Transfers Overall transfer level: Needs assistance Equipment used: Rolling walker (2 wheels) Transfers: Sit to/from Stand Sit to Stand: Contact guard assist           General transfer comment: cues for hand placement     Balance Overall balance assessment: Needs assistance Sitting-balance support: No upper extremity supported, Feet supported Sitting balance-Leahy Scale: Fair     Standing balance support: Bilateral upper extremity supported Standing balance-Leahy Scale: Poor Standing balance comment: reliant on RW for support                           ADL either performed or assessed with clinical judgement   ADL Overall ADL's : Needs assistance/impaired Eating/Feeding: Independent   Grooming: Contact guard assist;Standing           Upper Body Dressing : Minimal assistance;Sitting Upper Body Dressing Details (indicate cue type and reason): gown for back Lower Body Dressing: Maximal assistance;Sitting/lateral leans Lower Body Dressing Details (indicate cue type and reason): to donn shoes seated on EOB                    Extremity/Trunk Assessment              Vision       Perception     Praxis     Communication Communication Communication: No apparent difficulties   Cognition Arousal: Alert Behavior During Therapy: Flat affect Cognition: No family/caregiver present to determine baseline             OT - Cognition Comments: alert and oriented x4, very flat  Following commands: Intact        Cueing   Cueing Techniques: Verbal cues  Exercises Exercises: General Upper Extremity General Exercises - Upper Extremity Shoulder Horizontal ABduction: Strengthening, Both, 10 reps, Supine, Theraband Theraband Level (Shoulder Horizontal Abduction): Level 1 (Yellow) Elbow Flexion: Strengthening, Both, 10 reps, Supine, Theraband Theraband Level (Elbow Flexion): Level 1  (Yellow) Elbow Extension: Strengthening, Both, 10 reps, Supine, Theraband Theraband Level (Elbow Extension): Level 1 (Yellow)    Shoulder Instructions       General Comments HR increased to 125 with mobility, quickly fatigues    Pertinent Vitals/ Pain       Pain Assessment Pain Assessment: 0-10 Pain Score: 3  Pain Location: ribs left flank Pain Descriptors / Indicators: Grimacing, Discomfort Pain Intervention(s): Monitored during session, Limited activity within patient's tolerance, Repositioned  Home Living                                          Prior Functioning/Environment              Frequency  Min 2X/week        Progress Toward Goals  OT Goals(current goals can now be found in the care plan section)  Progress towards OT goals: Progressing toward goals  Acute Rehab OT Goals OT Goal Formulation: With patient Time For Goal Achievement: 11/04/24 Potential to Achieve Goals: Good ADL Goals Pt Will Perform Grooming: with supervision;standing Pt Will Perform Lower Body Bathing: with supervision;sit to/from stand Pt Will Perform Lower Body Dressing: with supervision;sit to/from stand Pt Will Transfer to Toilet: with supervision;ambulating Pt Will Perform Toileting - Clothing Manipulation and hygiene: with supervision;sit to/from stand Additional ADL Goal #1: Pt will complete bed mobility with supervision in preparation for ADLs.  Plan      Co-evaluation                 AM-PAC OT 6 Clicks Daily Activity     Outcome Measure   Help from another person eating meals?: None Help from another person taking care of personal grooming?: A Little Help from another person toileting, which includes using toliet, bedpan, or urinal?: A Little Help from another person bathing (including washing, rinsing, drying)?: A Lot Help from another person to put on and taking off regular upper body clothing?: A Little Help from another person to put on  and taking off regular lower body clothing?: A Lot 6 Click Score: 17    End of Session Equipment Utilized During Treatment: Gait belt;Rolling walker (2 wheels)  OT Visit Diagnosis: Unsteadiness on feet (R26.81);Other abnormalities of gait and mobility (R26.89);Pain Pain - Right/Left: Left Pain - part of body:  (ribs)   Activity Tolerance Patient tolerated treatment well   Patient Left in bed;with call bell/phone within reach   Nurse Communication Mobility status        Time: 9260-9196 OT Time Calculation (min): 24 min  Charges: OT General Charges $OT Visit: 1 Visit OT Treatments $Therapeutic Activity: 8-22 mins $Therapeutic Exercise: 8-22 mins  Dick Laine, OTA Acute Rehabilitation Services  Office [phone]   Jeb LITTIE Laine 10/22/2024, 9:05 AM

## 2024-10-22 NOTE — TOC Transition Note (Signed)
 Transition of Care [facility]) - Discharge Note   Patient Details  Name: Jaime Lopez MRN: [id number] Date of Birth: [birth year]  Transition of Care [facility]) CM/SW Contact:  Jeoffrey LITTIE Maranda ISRAEL Phone Number: 10/22/2024, 12:38 PM   Clinical Narrative:    Patient will DC to: [facility] Anticipated DC date: 10/22/24 Family notified: Yes Transport by: [facility]   Per MD patient ready for DC to [facility]. RN to call report prior to discharge [phone]. RN, patient, patient's family, and facility notified of DC. Discharge Summary and FL2 sent to facility. DC packet on chart. Ambulance transport requested for patient.   CSW will sign off for now as social work intervention is no longer needed. Please consult us  again if new needs arise.      Final next level of care: Home w Home Health Services Barriers to Discharge: Barriers Resolved   Patient Goals and CMS Choice Patient states their goals for this hospitalization and ongoing recovery are:: to return to home CMS Medicare.gov Compare Post Acute Care list provided to:: Patient Choice offered to / list presented to : Patient      Discharge Placement              Patient chooses bed at: Other - please specify in the comment section below: [facility]) Patient to be transferred to facility by: [facility] Name of family member notified: Nancyann Patient and family notified of of transfer: 10/22/24  Discharge Plan and Services Additional resources added to the After Visit Summary for     Discharge Planning Services: CM Consult Post Acute Care Choice: Durable Medical Equipment          DME Arranged: N/A DME Agency: NA       HH Arranged: PT, OT HH Agency: [facility] Date [facility] Agency Contacted: 10/22/24 Time HH Agency Contacted: 1232 Representative spoke with at [facility] Agency: Jon  Social Drivers of Health (SDOH) Interventions SDOH Screenings   Food Insecurity: No Food Insecurity (10/22/2024)   Housing: Unknown (10/22/2024)  Transportation Needs: No Transportation Needs (10/22/2024)  Utilities: Not At Risk (10/22/2024)  Social Connections: Patient Unable To Answer (10/22/2024)  Tobacco Use: Low Risk (10/20/2024)     Readmission Risk Interventions     No data to display

## 2024-10-22 NOTE — Progress Notes (Addendum)
 Pt discharging to [facility]. Pt's RN Evette called report to facility.  Copy of instructions printed and sent with transporters for facility. Pt d/c'd with belongings, transported by [facility].    Jaime Sullenger,RN SWOT

## 2024-10-22 NOTE — Plan of Care (Signed)
   Problem: Coping: Goal: Ability to adjust to condition or change in health will improve Outcome: Progressing   Problem: Nutritional: Goal: Maintenance of adequate nutrition will improve Outcome: Progressing   Problem: Skin Integrity: Goal: Risk for impaired skin integrity will decrease Outcome: Progressing

## 2024-10-22 NOTE — Discharge Summary (Addendum)
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 treatment. Nasal washings and aspirates are unacceptable for Xpert Xpress SARS-CoV-2/FLU/RSV testing.  Fact Sheet for Patients: bloggercourse.com  Fact Sheet for Healthcare Providers: seriousbroker.it  This test is not yet approved or cleared by the [place]  FDA and has been authorized for detection and/or diagnosis of SARS-CoV-2 by FDA under an Emergency Use Authorization (EUA). This EUA will remain in effect (meaning this test can be used) for the duration of the COVID-19 declaration under Section 564(b)(1) of the Act, 21 U.S.C. section 360bbb-3(b)(1), unless the authorization is terminated or revoked.     Resp Syncytial Virus by PCR NEGATIVE NEGATIVE Final    Comment: (NOTE) Fact Sheet for Patients: bloggercourse.com  Fact Sheet for Healthcare Providers: seriousbroker.it  This test is not yet approved or cleared by the [place]  FDA and has been  authorized for detection and/or diagnosis of SARS-CoV-2 by FDA under an Emergency Use Authorization (EUA). This EUA will remain in effect (meaning this test can be used) for the duration of the COVID-19 declaration under Section 564(b)(1) of the Act, 21 U.S.C. section 360bbb-3(b)(1), unless the authorization is terminated or revoked.  Performed at [facility], [address] [place], [place] [address]   Blood culture (routine x 2)     Status: Abnormal   Collection Time: 10/20/24 12:15 PM   Specimen: BLOOD RIGHT WRIST  Result Value Ref Range Status   Specimen Description   Final    BLOOD RIGHT WRIST Performed at [facility], [address], [place], [place] [address]    Special Requests   Final    BOTTLES DRAWN AEROBIC AND ANAEROBIC Blood Culture adequate volume Performed at [facility], [address] [place], [place] [address]    Culture  Setup Time   Final    GRAM POSITIVE COCCI IN CLUSTERS AEROBIC BOTTLE ONLY CRITICAL RESULT CALLED TO, READ BACK BY AND VERIFIED WITH: PHARMD Caren A on [id number] @1900  by SM    Culture (A)  Final    STAPHYLOCOCCUS EPIDERMIDIS THE SIGNIFICANCE OF ISOLATING THIS ORGANISM FROM A SINGLE SET OF BLOOD CULTURES WHEN MULTIPLE SETS ARE DRAWN IS UNCERTAIN. PLEASE NOTIFY THE MICROBIOLOGY DEPARTMENT WITHIN ONE WEEK IF SPECIATION AND SENSITIVITIES ARE REQUIRED. Performed at [facility], [address] [place], [place] [address]    Report Status 10/22/2024 FINAL  Final  Blood Culture ID Panel (Reflexed)     Status: Abnormal   Collection Time: 10/20/24 12:15 PM  Result Value Ref Range Status   Enterococcus faecalis NOT DETECTED NOT DETECTED Final   Enterococcus Faecium NOT DETECTED NOT DETECTED Final   Listeria monocytogenes NOT DETECTED NOT DETECTED Final   Staphylococcus species DETECTED (A) NOT DETECTED Final    Comment: CRITICAL RESULT CALLED TO, READ BACK BY AND VERIFIED WITH: PHARMD Caren A on [id number] @1900  by  SM    Staphylococcus aureus (BCID) NOT DETECTED NOT DETECTED Final   Staphylococcus epidermidis DETECTED (A) NOT  DETECTED Final    Comment: CRITICAL RESULT CALLED TO, READ BACK BY AND VERIFIED WITH: PHARMD Caren A on [id number] @1900  by SM    Staphylococcus lugdunensis NOT DETECTED NOT DETECTED Final   Streptococcus species NOT DETECTED NOT DETECTED Final   Streptococcus agalactiae NOT DETECTED NOT DETECTED Final   Streptococcus pneumoniae NOT DETECTED NOT DETECTED Final   Streptococcus pyogenes NOT DETECTED NOT DETECTED Final   A.calcoaceticus-baumannii NOT DETECTED NOT DETECTED Final   Bacteroides fragilis NOT DETECTED NOT DETECTED Final   Enterobacterales NOT DETECTED NOT DETECTED Final   Enterobacter cloacae complex NOT DETECTED NOT DETECTED Final   Escherichia coli NOT DETECTED NOT DETECTED Final   Klebsiella aerogenes NOT DETECTED NOT DETECTED Final   Klebsiella oxytoca NOT DETECTED NOT DETECTED Final   Klebsiella pneumoniae NOT DETECTED NOT DETECTED Final   Proteus species NOT DETECTED NOT DETECTED Final   Salmonella species NOT DETECTED NOT DETECTED Final   Serratia marcescens NOT DETECTED NOT DETECTED Final   Haemophilus influenzae NOT DETECTED NOT DETECTED Final   Neisseria meningitidis NOT DETECTED NOT DETECTED Final   Pseudomonas aeruginosa NOT DETECTED NOT DETECTED Final   Stenotrophomonas maltophilia NOT DETECTED NOT DETECTED Final   Candida albicans NOT DETECTED NOT DETECTED Final   Candida auris NOT DETECTED NOT DETECTED Final   Candida glabrata NOT DETECTED NOT DETECTED Final   Candida krusei NOT DETECTED NOT DETECTED Final   Candida parapsilosis NOT DETECTED NOT DETECTED Final   Candida tropicalis NOT DETECTED NOT DETECTED Final   Cryptococcus neoformans/gattii NOT DETECTED NOT DETECTED Final   Methicillin resistance mecA/C NOT DETECTED NOT DETECTED Final    Comment: Performed at [facility], [address], [place], [place] [address]  Blood culture (routine x  2)     Status: None (Preliminary result)   Collection Time: 10/20/24 12:20 PM   Specimen: BLOOD RIGHT ARM  Result Value Ref Range Status   Specimen Description   Final    BLOOD RIGHT ARM Performed at [facility], [address] [place], [place] [address]    Special Requests   Final    BOTTLES DRAWN AEROBIC AND ANAEROBIC Blood Culture adequate volume Performed at [facility], [address] [place], [place] [address]    Culture   Final    NO GROWTH 2 DAYS Performed at [facility], [address], [place], [place] [address]    Report Status PENDING  Incomplete    Labs: CBC: Recent Labs  Lab 10/20/24 0937 10/20/24 1238 10/21/24 0125 10/22/24 0517  WBC 20.0*  --  18.4* 15.9*  NEUTROABS 17.7*  --   --  12.4*  HGB 12.5* 11.9* 11.1* 11.1*  HCT 37.9* 35.0* 33.9* 32.2*  MCV 89.0  --  91.1 89.0  PLT 393  --  323 296   Basic Metabolic Panel: Recent Labs  Lab 10/20/24 0937 10/20/24 1238 10/21/24 0125 10/22/24 0517  NA 138 138 135 135  K 4.5 3.9 3.5 3.4*  CL 102  --  104 103  CO2 19*  --  18* 21*  GLUCOSE 226*  --  110* 111*  BUN 25*  --  22 19  CREATININE 1.53*  --  1.13 1.08  CALCIUM  9.8  --  9.0 8.7*   Liver Function Tests: No results for input(s): AST, ALT, ALKPHOS, BILITOT, PROT, ALBUMIN in the last 168 hours. CBG: Recent Labs  Lab 10/21/24 1257 10/21/24 1829 10/21/24 2059 10/22/24 0804 10/22/24 1157  GLUCAP  128* 180* 134* 200* 115*    Discharge time spent: greater than 30 minutes.  Signed: Amelia Burgard, DO [facility] 10/22/2024  ADDENDUM 10/24/2023  This morning I was contacted by micro and informed that the second of this patient's 2 blood cultures has grown out staph epidermidis. I contacted Dr. Fleeta Lim who is on for infectious disease. He looked into it, and the second culture is actually positive for GPC. There is no speciation or susceptibilities done. He has recommended that the patient be brought  back into the hospital, repeat blood cultures drawn, and the patient should be started on daptomycin .   I called the number in the record for the patient [phone]  [phone]. I am aware that the patient has dementia, but I thought perhaps this was a way to reach the patient's facility or someone else who was involved in the patient's care. I received a message in spanish stating no servicio. There is no name for the patient's facility or location in the information available to me. I am aware that he went to an assisted living facility. I then called the emergency contact person's number Ardean Griffiths - the patient's brother). I explained the situation to him. He was somewhat exasperated by the situation, but agreed to assist by calling the care manager number he had for the facility. I had previously discussed the patient with our patient placement coordinator, Alan. She had spoken with Albesa - also a nurse, adult. There are currently no beds available at either [facility] or [facility], but Albesa will call the facility when one becomes available. The patient may go to either facility wherever a bed becomes available first. The patient will be directly admitted. I received a call from [facility] at [facility] in [facility]. Our coordinator will call the facility at [phone] when a bed becomes available. The facility will arrange transport by ambulance. The facility will be given specific information as to what to do when they arrive in the facility. There is no reason why the patient should have to go through the ED. He will be directly admitted. When placement is settled I will advise receiving physician. "

## 2024-10-23 ENCOUNTER — Encounter (HOSPITAL_COMMUNITY): Payer: Self-pay

## 2024-10-23 ENCOUNTER — Encounter (HOSPITAL_COMMUNITY): Payer: Self-pay | Admitting: Internal Medicine

## 2024-10-23 ENCOUNTER — Other Ambulatory Visit: Payer: Self-pay

## 2024-10-23 ENCOUNTER — Inpatient Hospital Stay (HOSPITAL_COMMUNITY)
Admission: RE | Admit: 2024-10-23 | Discharge: 2024-10-26 | DRG: 200 | Disposition: A | Discharge status: Nursing Home (Includes ICF) | Source: Skilled Nursing Facility | Attending: Internal Medicine | Admitting: Internal Medicine

## 2024-10-23 DIAGNOSIS — I700 Atherosclerosis of aorta: Secondary | ICD-10-CM | POA: Diagnosis present

## 2024-10-23 DIAGNOSIS — E872 Acidosis, unspecified: Secondary | ICD-10-CM | POA: Diagnosis present

## 2024-10-23 DIAGNOSIS — E039 Hypothyroidism, unspecified: Secondary | ICD-10-CM | POA: Diagnosis present

## 2024-10-23 DIAGNOSIS — R7881 Bacteremia: Secondary | ICD-10-CM | POA: Diagnosis present

## 2024-10-23 DIAGNOSIS — M4316 Spondylolisthesis, lumbar region: Secondary | ICD-10-CM | POA: Diagnosis present

## 2024-10-23 DIAGNOSIS — N200 Calculus of kidney: Secondary | ICD-10-CM | POA: Diagnosis present

## 2024-10-23 DIAGNOSIS — Z79899 Other long term (current) drug therapy: Secondary | ICD-10-CM

## 2024-10-23 DIAGNOSIS — I152 Hypertension secondary to endocrine disorders: Secondary | ICD-10-CM | POA: Diagnosis present

## 2024-10-23 DIAGNOSIS — T797XXA Traumatic subcutaneous emphysema, initial encounter: Secondary | ICD-10-CM | POA: Diagnosis present

## 2024-10-23 DIAGNOSIS — D72829 Elevated white blood cell count, unspecified: Secondary | ICD-10-CM | POA: Diagnosis present

## 2024-10-23 DIAGNOSIS — S0003XA Contusion of scalp, initial encounter: Secondary | ICD-10-CM | POA: Diagnosis present

## 2024-10-23 DIAGNOSIS — N179 Acute kidney failure, unspecified: Secondary | ICD-10-CM | POA: Diagnosis present

## 2024-10-23 DIAGNOSIS — F209 Schizophrenia, unspecified: Secondary | ICD-10-CM | POA: Diagnosis present

## 2024-10-23 DIAGNOSIS — E7849 Other hyperlipidemia: Secondary | ICD-10-CM | POA: Diagnosis present

## 2024-10-23 DIAGNOSIS — S270XXA Traumatic pneumothorax, initial encounter: Principal | ICD-10-CM | POA: Diagnosis present

## 2024-10-23 DIAGNOSIS — W010XXA Fall on same level from slipping, tripping and stumbling without subsequent striking against object, initial encounter: Secondary | ICD-10-CM | POA: Diagnosis present

## 2024-10-23 DIAGNOSIS — N400 Enlarged prostate without lower urinary tract symptoms: Secondary | ICD-10-CM | POA: Diagnosis present

## 2024-10-23 DIAGNOSIS — R296 Repeated falls: Secondary | ICD-10-CM | POA: Diagnosis present

## 2024-10-23 DIAGNOSIS — E1159 Type 2 diabetes mellitus with other circulatory complications: Secondary | ICD-10-CM | POA: Diagnosis present

## 2024-10-23 DIAGNOSIS — S2242XA Multiple fractures of ribs, left side, initial encounter for closed fracture: Secondary | ICD-10-CM | POA: Diagnosis present

## 2024-10-23 DIAGNOSIS — I5032 Chronic diastolic (congestive) heart failure: Secondary | ICD-10-CM | POA: Diagnosis present

## 2024-10-23 DIAGNOSIS — Z794 Long term (current) use of insulin: Secondary | ICD-10-CM | POA: Diagnosis not present

## 2024-10-23 DIAGNOSIS — G219 Secondary parkinsonism, unspecified: Secondary | ICD-10-CM | POA: Diagnosis present

## 2024-10-23 DIAGNOSIS — Z66 Do not resuscitate: Secondary | ICD-10-CM | POA: Diagnosis present

## 2024-10-23 DIAGNOSIS — Z1152 Encounter for screening for COVID-19: Secondary | ICD-10-CM

## 2024-10-23 DIAGNOSIS — Z7984 Long term (current) use of oral hypoglycemic drugs: Secondary | ICD-10-CM

## 2024-10-23 DIAGNOSIS — Z79890 Hormone replacement therapy (postmenopausal): Secondary | ICD-10-CM

## 2024-10-23 DIAGNOSIS — E1169 Type 2 diabetes mellitus with other specified complication: Secondary | ICD-10-CM | POA: Diagnosis present

## 2024-10-23 LAB — BLOOD CULTURE ID PANEL (REFLEXED) - BCID2

## 2024-10-23 LAB — COMPREHENSIVE METABOLIC PANEL WITH GFR
ALT: 18 U/L (ref 0–44)
AST: 21 U/L (ref 15–41)
Albumin: 3.9 g/dL (ref 3.5–5.0)
Alkaline Phosphatase: 55 U/L (ref 38–126)
Anion gap: 15 (ref 5–15)
BUN: 26 mg/dL — ABNORMAL HIGH (ref 8–23)
CO2: 20 mmol/L — ABNORMAL LOW (ref 22–32)
Calcium: 9.5 mg/dL (ref 8.9–10.3)
Chloride: 104 mmol/L (ref 98–111)
Creatinine, Ser: 1.18 mg/dL (ref 0.61–1.24)
GFR, Estimated: >60 mL/min
Glucose, Bld: 141 mg/dL — ABNORMAL HIGH (ref 70–99)
Potassium: 3.7 mmol/L (ref 3.5–5.1)
Sodium: 139 mmol/L (ref 135–145)
Total Bilirubin: 0.4 mg/dL (ref 0.0–1.2)
Total Protein: 7.1 g/dL (ref 6.5–8.1)

## 2024-10-23 LAB — CBC WITH DIFFERENTIAL/PLATELET
Abs Immature Granulocytes: 0.14 K/uL — ABNORMAL HIGH (ref 0.00–0.07)
Basophils Absolute: 0 K/uL (ref 0.0–0.1)
Basophils Relative: 0 %
Eosinophils Absolute: 0.1 K/uL (ref 0.0–0.5)
Eosinophils Relative: 1 %
HCT: 34.9 % — ABNORMAL LOW (ref 39.0–52.0)
Hemoglobin: 12 g/dL — ABNORMAL LOW (ref 13.0–17.0)
Immature Granulocytes: 1 %
Lymphocytes Relative: 8 %
Lymphs Abs: 1.2 K/uL (ref 0.7–4.0)
MCH: 30.5 pg (ref 26.0–34.0)
MCHC: 34.4 g/dL (ref 30.0–36.0)
MCV: 88.8 fL (ref 80.0–100.0)
Monocytes Absolute: 1.4 K/uL — ABNORMAL HIGH (ref 0.1–1.0)
Monocytes Relative: 9 %
Neutro Abs: 12.4 K/uL — ABNORMAL HIGH (ref 1.7–7.7)
Neutrophils Relative %: 81 %
Platelets: 368 K/uL (ref 150–400)
RBC: 3.93 MIL/uL — ABNORMAL LOW (ref 4.22–5.81)
RDW: 14.4 % (ref 11.5–15.5)
WBC: 15.4 K/uL — ABNORMAL HIGH (ref 4.0–10.5)
nRBC: 0 % (ref 0.0–0.2)

## 2024-10-23 MED ORDER — METHOCARBAMOL 500 MG PO TABS
500.0000 mg | ORAL_TABLET | Freq: Three times a day (TID) | ORAL | Status: DC | PRN
  Filled 2024-10-23: qty 1

## 2024-10-23 MED ORDER — LIDOCAINE 5 % EX PTCH
1.0000 | MEDICATED_PATCH | CUTANEOUS | Status: DC
  Administered 2024-10-24: 1 via TRANSDERMAL
  Filled 2024-10-23 (×2): qty 1

## 2024-10-23 MED ORDER — ONDANSETRON HCL 4 MG/2ML IJ SOLN: 4.0000 mg | Freq: Four times a day (QID) | INTRAMUSCULAR | Status: DC | PRN

## 2024-10-23 MED ORDER — METFORMIN HCL 500 MG PO TABS
1000.0000 mg | ORAL_TABLET | Freq: Two times a day (BID) | ORAL | Status: DC
  Administered 2024-10-24 – 2024-10-26 (×5): 1000 mg via ORAL
  Filled 2024-10-23 (×5): qty 2

## 2024-10-23 MED ORDER — ENOXAPARIN SODIUM 40 MG/0.4ML IJ SOSY
40.0000 mg | PREFILLED_SYRINGE | INTRAMUSCULAR | Status: DC
  Administered 2024-10-23 – 2024-10-25 (×3): 40 mg via SUBCUTANEOUS
  Filled 2024-10-23 (×3): qty 0.4

## 2024-10-23 MED ORDER — PSYLLIUM 95 % PO PACK
1.0000 | PACK | Freq: Every day | ORAL | Status: DC
  Administered 2024-10-24 – 2024-10-26 (×3): 1 via ORAL
  Filled 2024-10-23 (×3): qty 1

## 2024-10-23 MED ORDER — LEVOTHYROXINE SODIUM 75 MCG PO TABS
75.0000 ug | ORAL_TABLET | Freq: Every day | ORAL | Status: DC
  Administered 2024-10-24 – 2024-10-26 (×3): 75 ug via ORAL
  Filled 2024-10-23 (×3): qty 1

## 2024-10-23 MED ORDER — ACETAMINOPHEN 650 MG RE SUPP: 650.0000 mg | Freq: Four times a day (QID) | RECTAL | Status: DC | PRN

## 2024-10-23 MED ORDER — ACETAMINOPHEN 325 MG PO TABS
650.0000 mg | ORAL_TABLET | Freq: Four times a day (QID) | ORAL | Status: DC | PRN
  Filled 2024-10-23: qty 2

## 2024-10-23 MED ORDER — ATORVASTATIN CALCIUM 10 MG PO TABS
10.0000 mg | ORAL_TABLET | Freq: Every day | ORAL | Status: DC
  Administered 2024-10-24 – 2024-10-26 (×3): 10 mg via ORAL
  Filled 2024-10-23 (×3): qty 1

## 2024-10-23 MED ORDER — TAMSULOSIN HCL 0.4 MG PO CAPS
0.4000 mg | ORAL_CAPSULE | Freq: Every day | ORAL | Status: DC
  Administered 2024-10-24 – 2024-10-25 (×2): 0.4 mg via ORAL
  Filled 2024-10-23 (×2): qty 1

## 2024-10-23 MED ORDER — ONDANSETRON HCL 4 MG PO TABS: 4.0000 mg | ORAL_TABLET | Freq: Four times a day (QID) | ORAL | Status: DC | PRN

## 2024-10-23 MED ORDER — SENNOSIDES-DOCUSATE SODIUM 8.6-50 MG PO TABS
1.0000 | ORAL_TABLET | Freq: Every day | ORAL | Status: DC
  Administered 2024-10-23 – 2024-10-25 (×3): 1 via ORAL
  Filled 2024-10-23 (×3): qty 1

## 2024-10-23 MED ORDER — AMLODIPINE BESYLATE 5 MG PO TABS
5.0000 mg | ORAL_TABLET | Freq: Every day | ORAL | Status: DC
  Administered 2024-10-24 – 2024-10-26 (×3): 5 mg via ORAL
  Filled 2024-10-23 (×3): qty 1

## 2024-10-23 MED ORDER — DAPTOMYCIN-SODIUM CHLORIDE 500-0.9 MG/50ML-% IV SOLN
6.0000 mg/kg | Freq: Every day | INTRAVENOUS | Status: DC
  Administered 2024-10-23 – 2024-10-25 (×3): 500 mg via INTRAVENOUS
  Filled 2024-10-23 (×5): qty 50

## 2024-10-23 MED ORDER — TRAZODONE HCL 50 MG PO TABS
50.0000 mg | ORAL_TABLET | Freq: Every day | ORAL | Status: DC
  Administered 2024-10-23 – 2024-10-25 (×3): 50 mg via ORAL
  Filled 2024-10-23 (×3): qty 1

## 2024-10-23 MED ORDER — FLUPHENAZINE HCL 5 MG PO TABS
2.5000 mg | ORAL_TABLET | Freq: Every day | ORAL | Status: DC
  Administered 2024-10-24 – 2024-10-26 (×3): 2.5 mg via ORAL
  Filled 2024-10-23 (×3): qty 1

## 2024-10-23 MED ORDER — VITAMIN D 25 MCG (1000 UNIT) PO TABS
1000.0000 [IU] | ORAL_TABLET | Freq: Every day | ORAL | Status: DC
  Administered 2024-10-24 – 2024-10-26 (×3): 1000 [IU] via ORAL
  Filled 2024-10-23 (×3): qty 1

## 2024-10-23 MED ORDER — BENZTROPINE MESYLATE 1 MG PO TABS
0.5000 mg | ORAL_TABLET | ORAL | Status: DC
  Administered 2024-10-24 – 2024-10-26 (×2): 0.5 mg via ORAL
  Filled 2024-10-23 (×2): qty 1

## 2024-10-23 NOTE — Progress Notes (Signed)
 Started Daptomycin  at 1806 after calling the lab / micro and getting confirmation that 2 cultures had been drawn.

## 2024-10-23 NOTE — H&P (Signed)
 " History and Physical    Patient: Jaime Lopez FMW:[id number] DOB: [birth year] DOA: 10/23/2024 DOS: the patient was seen and examined on 10/23/2024 PCP: Maritza Alm HERO, MD  Patient coming from: ALF/ILF  Chief Complaint: The patient is a 74 yr old man. He was discharged from this facility on 10/22/2024 after a 3 day inpatient stay due to fall resulting in a posterior 6th rib fracture resulting in hemopneumothorax. During that admission the patient was evaluated by trauma surgery who confirmed resolution of the pneumothorax on 10/22/2024. PT/OT also had evaluated the patient on 10/23/2023 and determined that the patient was appropriate for discharge to the ALF with home health for PT/OT.  On that admission the patient was noted to have a WBC of 20. Blood cultures x 2 were obtained. On 1/15 it was reported that 1/2 blood cultures had grown out Staph epidermidis. This is ordinarily a contaminant. The patient had no fevers, no source of infection, and the WBC count declined over the next couple of days as it would if it were due to inflammation related to the patient's rib fracture and hemopneumothorax. The patient was discharged back to his ALF.   On the morning of 10/23/2024 I was informed that the second blood culture had also grown out Staph epidermidis. I discussed the patient with Dr. Fleeta Rothman. He looked into it and in fact the second culture had only reported GPC. He recommended that the patient be brought back as inpatient, repeat blood cultures drawn, and started on daptomycin .   The patient arrived here this afternoon. No new complaints. He is understandably irritated for the inconvenience of returning to the hospital.   HPI: Jaime Lopez is a 74 y.o. 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   Review of Systems: unable to review all systems due to the inability of the patient to answer  questions.  Past Medical History:  Diagnosis Date   Diabetes mellitus without complication (HCC)    Diastolic heart failure (HCC)    Hyperlipidemia    Hypertension    Prostate disorder    Schizophrenia (HCC)    Seizures (HCC)    History reviewed. No pertinent surgical history. Social History:  reports that he has never smoked. He has never used smokeless tobacco. He reports that he does not drink alcohol and does not use drugs.  Allergies[1]  History reviewed. No pertinent family history.  Prior to Admission medications  Medication Sig Start Date End Date Taking? Authorizing Provider  levothyroxine  (SYNTHROID ) 75 MCG tablet Take 75 mcg by mouth daily. 10/14/22  Yes [provider]  metFORMIN  (GLUCOPHAGE ) 1000 MG tablet Take 1,000 mg by mouth 2 (two) times daily. 10/09/24  Yes [provider]  amLODipine  (NORVASC ) 5 MG tablet Take 5 mg by mouth daily. 07/04/20   [provider]  atorvastatin  (LIPITOR) 10 MG tablet Take 10 mg by mouth daily. 10/11/24   [provider]  benztropine  (COGENTIN ) 0.5 MG tablet Take 1 tablet (0.5 mg total) by mouth every other day. 10/23/24   Rebbeca Sheperd, DO  cholecalciferol  (VITAMIN D3) 25 MCG (1000 UNIT) tablet Take 1,000 Units by mouth daily.    [provider]  fluPHENAZine  (PROLIXIN ) 2.5 MG tablet Take 2.5 mg by mouth daily. 10/15/22   [provider]  JANUVIA 100 MG tablet Take 100 mg by mouth daily. 10/06/24   [provider]  lidocaine  (LIDODERM ) 5 % Place 2 patches onto  the skin daily. Remove & Discard patch within 12 hours or as directed by MD 10/22/24   Demonica Farrey, DO  lisinopril  (PRINIVIL ,ZESTRIL ) 40 MG tablet Take 40 mg by mouth daily with supper.     [provider]  methocarbamol  (ROBAXIN ) 500 MG tablet Take 1 tablet (500 mg total) by mouth every 8 (eight) hours as needed for muscle spasms. 10/22/24   Viviene Thurston, DO  psyllium (REGULOID) 0.52 g capsule Take 0.52 g by mouth daily.     [provider]  sennosides-docusate sodium  (SENOKOT-S) 8.6-50 MG tablet Take 1 tablet by mouth daily.    [provider]  tamsulosin  (FLOMAX ) 0.4 MG CAPS capsule Take 0.4 mg by mouth daily after supper.  03/24/20   [provider]  traZODone  (DESYREL ) 50 MG tablet Take 50 mg by mouth daily with supper.  06/01/20   [provider]    Physical Exam: Vitals:   10/23/24 1528 10/23/24 1536 10/23/24 1640  BP:  120/67   Pulse:  (!) 103   Resp:  18   Temp:  97.9 F (36.6 C)   TempSrc:  Oral   SpO2: 97% 97%   Weight:   90.2 kg  Height:   5' 10 (1.778 m)   Exam:  Constitutional:  The patient is awake, alert, and oriented x 3. No acute distress. Eyes:  pupils and irises appear normal Normal lids and conjunctivae ENMT:  grossly normal hearing  Lips appear normal external ears, nose appear normal Oropharynx: mucosa, tongue,posterior pharynx appear normal Neck:  neck appears normal, no masses, normal ROM, supple no thyromegaly Respiratory:  No increased work of breathing. No wheezes, rales, or rhonchi No tactile fremitus Cardiovascular:  Regular rate and rhythm No murmurs, ectopy, or gallups. No lateral PMI. No thrills. Abdomen:  Abdomen is soft, non-tender, non-distended No hernias, masses, or organomegaly Normoactive bowel sounds.  Musculoskeletal:  No cyanosis, clubbing, or edema Skin:  No rashes, lesions, ulcers palpation of skin: no induration or nodules Neurologic:  CN 2-12 intact Sensation all 4 extremities intact Psychiatric:  Mental status Mood, affect appropriate Orientation to person, place, time  judgment and insight appear intact  Data Reviewed:  CBC, CMP  Assessment and Plan: Gram Positive Cocci Bacteremia On the 10/20/2024 admission the patient was noted to have a WBC of 20. Blood cultures x 2 were obtained. On 1/15 it was reported that 1/2 blood cultures had grown out Staph epidermidis. This is ordinarily a  contaminant. The patient had no fevers, no source of infection, and the WBC count declined over the next couple of days as it would if it were due to inflammation related to the patient's rib fracture and hemopneumothorax. The patient was discharged back to his ALF.   On the morning of 10/23/2024 I was informed that the second blood culture had also grown out Staph epidermidis. I discussed the patient with Dr. Fleeta Rothman. He looked into it and in fact the second culture had only reported GPC. He recommended that the patient be brought back as inpatient, repeat blood cultures drawn, and started on daptomycin .   The patient has arrived. Repeat blood cultures were ordered and daptomycin  ordered. Formal consult with infectious disease has also been ordered.  Renal insufficiency Left urolithiasis: Creatinine 1.53 on admission.  Improved to 1.13 on the morning of 10/21/2024.  Baseline appears to be 1.04. Imaging showed left-sided urolithiasis with large central 2.9 cm calculus.  No hydronephrosis noted. - IV fluids will be stopped.  - Hold  lisinopril  as patient is normotensive. - Hold metformin  for 48 hours following the administration of IV contrast dye. - Monitor UOP, bladder scan and In-N-Out cath as needed   Lactic acidosis: Resolved. Continue to hold metformin .   Leukocytosis: Likely reactive in setting of traumatic injuries.  As above, no obvious infectious process.  Repeat CBC in AM. WBC 20.0 on presentation has declined to 18.4 on the morning of 10/21/2024. Continuijng to decline. Due to trauma.1/2 blood cultures drawn on 10/20/2024 has grown out Staph epidermidis. This was thought to be a contaminate, however On the morning of 10/23/2024 I was informed that the second blood culture had also grown out Staph epidermidis. I discussed the patient with Dr. Fleeta Rothman. He looked into it and in fact the second culture had only reported GPC. He recommended that the patient be brought back as inpatient, repeat  blood cultures drawn, and started on daptomycin .    Type 2 diabetes: Holding home meds.  Placed on SSI.   Hypertension: Continue amlodipine .  Holding lisinopril . Patient is normotensive.   Hyperlipidemia: Continue atorvastatin .   Hypothyroidism: Continue Synthroid .   BPH: Continue tamsulosin .   Schizophrenia: Continue fluphenazine .   Secondary parkinsonism: Continue benztropine .   Debility Patient has had frequent falls. He has been cleared for discharge to the assisted living facility. He will have home health PT/OT.   DVT prophylaxis: enoxaparin  (LOVENOX ) injection 40 mg Start: 10/20/24 1945 Code Status:   Code Status: Do not attempt resuscitation (DNR) PRE-ARREST INTERVENTIONS DESIRED discussed with patient on admission Family Communication: None present on admission Disposition Plan: From [facility], dispo pending clinical progress    Advance Care Planning:   Code Status: Do not attempt resuscitation (DNR) PRE-ARREST INTERVENTIONS DESIRED   Consults: Infectious disease  Family Communication: The patient's readmission was discussed with the patient's brother Nancyann prior to readmission.  Severity of Illness: The appropriate patient status for this patient is INPATIENT. Inpatient status is judged to be reasonable and necessary in order to provide the required intensity of service to ensure the patient's safety. The patient's presenting symptoms, physical exam findings, and initial radiographic and laboratory data in the context of their chronic comorbidities is felt to place them at high risk for further clinical deterioration. Furthermore, it is not anticipated that the patient will be medically stable for discharge from the hospital within 2 midnights of admission.   * I certify that at the point of admission it is my clinical judgment that the patient will require inpatient hospital care spanning beyond 2 midnights from the point of admission due to high intensity of  service, high risk for further deterioration and high frequency of surveillance required.*  Author: Dangelo Guzzetta, DO 10/23/2024 8:06 PM  For on call review [email or web address].     [1] No Known Allergies  "

## 2024-10-23 NOTE — Plan of Care (Signed)
   Problem: Education: Goal: Knowledge of General Education information will improve Description: Including pain rating scale, medication(s)/side effects and non-pharmacologic comfort measures Outcome: Progressing   Problem: Health Behavior/Discharge Planning: Goal: Ability to manage health-related needs will improve Outcome: Progressing   Problem: Activity: Goal: Risk for activity intolerance will decrease Outcome: Progressing

## 2024-10-24 DIAGNOSIS — R7881 Bacteremia: Secondary | ICD-10-CM | POA: Diagnosis not present

## 2024-10-24 LAB — CBC
HCT: 32.9 % — ABNORMAL LOW (ref 39.0–52.0)
Hemoglobin: 11.1 g/dL — ABNORMAL LOW (ref 13.0–17.0)
MCH: 29.7 pg (ref 26.0–34.0)
MCHC: 33.7 g/dL (ref 30.0–36.0)
MCV: 88 fL (ref 80.0–100.0)
Platelets: 345 K/uL (ref 150–400)
RBC: 3.74 MIL/uL — ABNORMAL LOW (ref 4.22–5.81)
RDW: 14.3 % (ref 11.5–15.5)
WBC: 11.8 K/uL — ABNORMAL HIGH (ref 4.0–10.5)
nRBC: 0 % (ref 0.0–0.2)

## 2024-10-24 LAB — BASIC METABOLIC PANEL WITH GFR
Anion gap: 13 (ref 5–15)
BUN: 22 mg/dL (ref 8–23)
CO2: 21 mmol/L — ABNORMAL LOW (ref 22–32)
Calcium: 9.1 mg/dL (ref 8.9–10.3)
Chloride: 106 mmol/L (ref 98–111)
Creatinine, Ser: 0.94 mg/dL (ref 0.61–1.24)
GFR, Estimated: >60 mL/min
Glucose, Bld: 100 mg/dL — ABNORMAL HIGH (ref 70–99)
Potassium: 3.4 mmol/L — ABNORMAL LOW (ref 3.5–5.1)
Sodium: 140 mmol/L (ref 135–145)

## 2024-10-24 LAB — CK: Total CK: 73 U/L (ref 49–397)

## 2024-10-24 NOTE — Plan of Care (Signed)
   Problem: Activity: Goal: Risk for activity intolerance will decrease Outcome: Progressing   Problem: Coping: Goal: Level of anxiety will decrease Outcome: Progressing   Problem: Pain Managment: Goal: General experience of comfort will improve and/or be controlled Outcome: Progressing

## 2024-10-24 NOTE — Plan of Care (Signed)

## 2024-10-24 NOTE — Progress Notes (Signed)
 " Progress Note   Patient: Jaime Lopez FMW:[id number] DOB: 23-Feb-1951 DOA: 10/23/2024     1 DOS: the patient was seen and examined on 10/24/2024   Brief hospital course: The patient is a 74 yr old man. He was discharged from this facility on 10/22/2024 after a 3 day inpatient stay due to fall resulting in a posterior 6th rib fracture resulting in hemopneumothorax. During that admission the patient was evaluated by trauma surgery who confirmed resolution of the pneumothorax on 10/22/2024. PT/OT also had evaluated the patient on 10/23/2023 and determined that the patient was appropriate for discharge to the ALF with home health for PT/OT.   On that admission the patient was noted to have a WBC of 20. Blood cultures x 2 were obtained. On 1/15 it was reported that 1/2 blood cultures had grown out Staph epidermidis. This is ordinarily a contaminant. The patient had no fevers, no source of infection, and the WBC count declined over the next couple of days as it would if it were due to inflammation related to the patient's rib fracture and hemopneumothorax. The patient was discharged back to his ALF.    On the morning of 10/23/2024 I was informed that the second blood culture had also grown out Staph epidermidis. I discussed the patient with Dr. Fleeta Rothman. He looked into it and in fact the second culture had only reported GPC. He recommended that the patient be brought back as inpatient, repeat blood cultures drawn, and started on daptomycin .   The patient returned to [facility]. Blood cultures x 2 were obtained. The patient was started on IV Daptomycin .  Assessment and Plan: Gram Positive Cocci Bacteremia On the 10/20/2024 admission the patient was noted to have a WBC of 20. Blood cultures x 2 were obtained. On 1/15 it was reported that 1/2 blood cultures had grown out Staph epidermidis. This is ordinarily a contaminant. The patient had no fevers, no source of infection, and the WBC count declined over the next  couple of days as it would if it were due to inflammation related to the patient's rib fracture and hemopneumothorax. The patient was discharged back to his ALF.    On the morning of 10/23/2024 I was informed that the second blood culture had also grown out Staph epidermidis. I discussed the patient with Dr. Fleeta Rothman. He looked into it and in fact the second culture had only reported GPC. He recommended that the patient be brought back as inpatient, repeat blood cultures drawn, and started on daptomycin .    The patient has arrived. Repeat blood cultures were ordered and daptomycin  ordered. Formal consult with infectious disease has also been ordered.   Renal insufficiency Left urolithiasis: Creatinine 1.53 on admission.  Improved to 1.13 on the morning of 10/21/2024.  Baseline appears to be 1.04. Imaging showed left-sided urolithiasis with large central 2.9 cm calculus.  No hydronephrosis noted. - Creatinine is improved to 0.94 on 10/24/2024.  - Hold lisinopril  as patient is normotensive. - Metformin  was held for 48 hours following the administration of IV contrast dye. It was restarted.   Lactic acidosis: Resolved. Continue to hold metformin .   Leukocytosis: WBC has declined from 15.4 on re-admission to 11.8 on 10/24/2024.   Type 2 diabetes: Holding home meds.  Placed on SSI.Metformin  restarted.   Hypertension: Continue amlodipine .  Holding lisinopril . Patient is normotensive.   Hyperlipidemia: Continue atorvastatin .   Hypothyroidism: Continue Synthroid .   BPH: Continue tamsulosin .   Schizophrenia: Continue fluphenazine .   Secondary parkinsonism:  Continue benztropine .   Debility Patient has had frequent falls. He has been cleared for discharge to the assisted living facility. He will have home health PT/OT.   DVT prophylaxis: enoxaparin  (LOVENOX ) injection 40 mg Start: 10/20/24 1945 Code Status:   Code Status: Do not attempt resuscitation (DNR) PRE-ARREST INTERVENTIONS DESIRED  discussed with patient on admission Family Communication: None present on admission Disposition Plan: From [facility], dispo pending clinical progress      Advance Care Planning:   Code Status: Do not attempt resuscitation (DNR) PRE-ARREST INTERVENTIONS DESIRED    Consults: Infectious disease   Family Communication: The patient's readmission was discussed with the patient's brother Nancyann prior to readmission.         Subjective: The patient is resting comfortably. No new complaints.   Physical Exam: Vitals:   10/23/24 2300 10/24/24 0500 10/24/24 0744 10/24/24 1543  BP: (!) 166/87 (!) 151/85 (!) 166/81 139/60  Pulse: 92 86 87 99  Resp: 18 18 16 16   Temp: 98.9 F (37.2 C) 98.6 F (37 C) 98.4 F (36.9 C) 98.6 F (37 C)  TempSrc: Oral Oral    SpO2: 98% 99% 97% 98%  Weight:      Height:       Exam:  Constitutional:  The patient is awake, alert, and oriented x 3. No acute distress. Respiratory:  No increased work of breathing. No wheezes, rales, or rhonchi No tactile fremitus Cardiovascular:  Regular rate and rhythm No murmurs, ectopy, or gallups. No lateral PMI. No thrills. Abdomen:  Abdomen is soft, non-tender, non-distended No hernias, masses, or organomegaly Normoactive bowel sounds.  Musculoskeletal:  No cyanosis, clubbing, or edema Skin:  No rashes, lesions, ulcers palpation of skin: no induration or nodules Neurologic:  CN 2-12 intact Sensation all 4 extremities intact Psychiatric:  Mental status Mood, affect appropriate Orientation to person, place, time  judgment and insight appear intact  Data Reviewed:  CBC, BMP  Family Communication: None available  Disposition: Status is: Inpatient Remains inpatient appropriate because: Patient requires IV Daptomycin  until second positive blood culture has susceptibilities and repeat cultures have resulted.  Planned Discharge Destination: Assisted Living    Time spent: 40 minutes  Author: Sophee Mckimmy, DO 10/24/2024 6:18 PM  For on call review [email or web address].  "

## 2024-10-25 LAB — BASIC METABOLIC PANEL WITH GFR
Anion gap: 10 (ref 5–15)
BUN: 21 mg/dL (ref 8–23)
CO2: 22 mmol/L (ref 22–32)
Calcium: 8.9 mg/dL (ref 8.9–10.3)
Chloride: 104 mmol/L (ref 98–111)
Creatinine, Ser: 1 mg/dL (ref 0.61–1.24)
GFR, Estimated: >60 mL/min
Glucose, Bld: 130 mg/dL — ABNORMAL HIGH (ref 70–99)
Potassium: 4.1 mmol/L (ref 3.5–5.1)
Sodium: 137 mmol/L (ref 135–145)

## 2024-10-25 LAB — CBC WITH DIFFERENTIAL/PLATELET
Abs Immature Granulocytes: 0.1 K/uL — ABNORMAL HIGH (ref 0.00–0.07)
Basophils Absolute: 0.1 K/uL (ref 0.0–0.1)
Basophils Relative: 1 %
Eosinophils Absolute: 0.2 K/uL (ref 0.0–0.5)
Eosinophils Relative: 2 %
HCT: 32.6 % — ABNORMAL LOW (ref 39.0–52.0)
Hemoglobin: 10.9 g/dL — ABNORMAL LOW (ref 13.0–17.0)
Immature Granulocytes: 1 %
Lymphocytes Relative: 16 %
Lymphs Abs: 1.8 K/uL (ref 0.7–4.0)
MCH: 29.9 pg (ref 26.0–34.0)
MCHC: 33.4 g/dL (ref 30.0–36.0)
MCV: 89.3 fL (ref 80.0–100.0)
Monocytes Absolute: 0.9 K/uL (ref 0.1–1.0)
Monocytes Relative: 8 %
Neutro Abs: 8 K/uL — ABNORMAL HIGH (ref 1.7–7.7)
Neutrophils Relative %: 72 %
Platelets: 350 K/uL (ref 150–400)
RBC: 3.65 MIL/uL — ABNORMAL LOW (ref 4.22–5.81)
RDW: 14.2 % (ref 11.5–15.5)
WBC: 11.1 K/uL — ABNORMAL HIGH (ref 4.0–10.5)
nRBC: 0 % (ref 0.0–0.2)

## 2024-10-25 LAB — CULTURE, BLOOD (ROUTINE X 2): Special Requests: ADEQUATE

## 2024-10-25 NOTE — Hospital Course (Signed)
 Patient is a 74 years old male with past medical history of diabetes, heart failure, hyperlipidemia, hypertension, schizophrenia with recent discharge from the hospital on 10/22/2024 after a 3 day inpatient stay due to fall resulting in a posterior 6th rib fracture resulting in hemopneumothorax that was treated conservatively was discharged to assisted living facility with home PT.  On that admission patient had leukocytosis but after discharge on 10/23/2024 patient had second bottle of blood cultures positive for Staph epidermidis..  Patient was then admitted to hospital and repeat blood cultures were obtained and was started on IV vancomycin .    Assessment and Plan:  Gram Positive Cocci Bacteremia Blood culture showing Staph epidermidis in both samples.  No fevers, no source of infection,.  Patient has been started on IV daptomycin .    Renal insufficiency Left urolithiasis: Creatinine 1.53 on admission.  Improved to 1.13 on the morning of 10/21/2024.  Baseline appears to be 1.04. Imaging showed left-sided urolithiasis with large central 2.9 cm calculus.  No hydronephrosis noted.  Creatinine has improved at this time.  Lisinopril  on hold.  Metformin  was on hold as initially l which was restarted.   Lactic acidosis: Resolved. Continue to hold metformin .   Leukocytosis: WBC has declined from 15.4 on re-admission to 11.8 on 10/24/2024.   Type 2 diabetes: Continue sliding scale insulin .   Hypertension: Continue amlodipine .  Hold lisinopril .   Hyperlipidemia: Continue atorvastatin .   Hypothyroidism: Continue Synthroid .   BPH: Continue tamsulosin .   Schizophrenia: Continue fluphenazine .   Secondary parkinsonism: Continue benztropine .   Debility/falls. Seen by PT, OT and has been considered for assisted living facility.  Will continue home health PT OT

## 2024-10-25 NOTE — TOC Initial Note (Signed)
 Transition of Care [facility]) - Initial/Assessment Note    Patient Details  Name: Jaime Lopez MRN: [id number] Date of Birth: 26-Nov-1950  Transition of [facility]) CM/SW Contact:    Rosalva Jon Bloch, RN Phone Number: 10/25/2024, 11:10 AM  Clinical Narrative:                 Pt with recent d/c from hospital, 10/22/2024. Presents back after pt informed of culture results:gram positive Cocci Bacteremia. Pt is from [facility]. Set up with [facility] with previous admit fpr home health PT/OT services. Pt will need resumption orders from MD for continued services @ d/c. Pt states has RW @ home.  ICM team following and will assist with needs....  Expected Discharge Plan: Home w Home Health Services Barriers to Discharge: Continued Medical Work up   Patient Goals and CMS Choice            Expected Discharge Plan and Services In-house Referral: Clinical Social Work Discharge Planning Services: CM Consult Post Acute Care Choice: Durable Medical Equipment (RW) Living arrangements for the past 2 months: Assisted Living Facility                                      Prior Living Arrangements/Services Living arrangements for the past 2 months: Assisted Living Facility Lives with:: Other (Comment) (ALF) Patient language and need for interpreter reviewed:: Yes Do you feel safe going back to the place where you live?: Yes      Need for Family Participation in Patient Care: No (Comment) Care giver support system in place?: Yes (comment) Current home services: DME (RW) Criminal Activity/Legal Involvement Pertinent to Current Situation/Hospitalization: No - Comment as needed  Activities of Daily Living   ADL Screening (condition at time of admission) Independently performs ADLs?: No Does the patient have a NEW difficulty with bathing/dressing/toileting/self-feeding that is expected to last >3 days?: No Does the patient have a NEW difficulty with getting in/out of bed,  walking, or climbing stairs that is expected to last >3 days?: No Does the patient have a NEW difficulty with communication that is expected to last >3 days?: No Is the patient deaf or have difficulty hearing?: No Does the patient have difficulty seeing, even when wearing glasses/contacts?: No Does the patient have difficulty concentrating, remembering, or making decisions?: No  Permission Sought/Granted      Share Information with NAME: Aloysuis Ribaudo  Brother   [phone]           Emotional Assessment Appearance:: Appears stated age Attitude/Demeanor/Rapport: Engaged Affect (typically observed): Appropriate Orientation: : Oriented to Self, Oriented to Place, Oriented to  Time, Oriented to Situation Alcohol / Substance Use: Not Applicable Psych Involvement: No (comment)  Admission diagnosis:  Positive blood culture [R78.81] Gram-positive bacteremia [R78.81] Patient Active Problem List   Diagnosis Date Noted   Gram-positive bacteremia 10/23/2024   Closed traumatic fracture of ribs of left side with pneumothorax 10/20/2024   Renal insufficiency 10/20/2024   Hypothyroidism 10/20/2024   BPH (benign prostatic hyperplasia) 10/20/2024   COVID-19 virus infection 06/08/2020   Generalized weakness 06/08/2020   Sepsis (HCC) 11/16/2016   Insulin  dependent type 2 diabetes mellitus (HCC) 11/16/2016   Hyperlipidemia associated with type 2 diabetes mellitus (HCC) 11/16/2016   Hypertension associated with diabetes (HCC) 11/16/2016   Obesity, Class II, BMI 35-39.9, with comorbidity 11/16/2016   Schizophrenia (HCC) 11/16/2016   Syncope 11/16/2016   Diarrhea  11/16/2016   Chronic bilateral low back pain without sciatica 02/21/2016   PCP:  Maritza Alm HERO, MD Pharmacy:  No Pharmacies Listed    Social Drivers of Health (SDOH) Social History: SDOH Screenings   Food Insecurity: No Food Insecurity (10/23/2024)  Housing: Low Risk (10/23/2024)  Transportation Needs: No Transportation Needs  (10/23/2024)  Utilities: Not At Risk (10/23/2024)  Social Connections: Unknown (10/23/2024)  Tobacco Use: Low Risk (10/23/2024)   SDOH Interventions:     Readmission Risk Interventions     No data to display

## 2024-10-25 NOTE — Plan of Care (Signed)
  Problem: Activity: Goal: Risk for activity intolerance will decrease Outcome: Progressing   Problem: Safety: Goal: Ability to remain free from injury will improve Outcome: Progressing   Problem: Education: Goal: Knowledge of General Education information will improve Description: Including pain rating scale, medication(s)/side effects and non-pharmacologic comfort measures Outcome: Progressing   

## 2024-10-25 NOTE — Progress Notes (Signed)
 " PROGRESS NOTE  Jaime Lopez FMW:[id number] DOB: 1951/06/27 DOA: 10/23/2024 PCP: Maritza Alm HERO, MD   LOS: 2 days   Brief narrative:  Patient is a 74 years old male with past medical history of diabetes, heart failure, hyperlipidemia, hypertension, schizophrenia with recent discharge from the hospital on 10/22/2024 after a 3 day inpatient stay due to fall resulting in a posterior 6th rib fracture resulting in hemopneumothorax that was treated conservatively was discharged to assisted living facility with home PT.  On that admission, patient had leukocytosis but after discharge on 10/23/2024 patient had second bottle of blood cultures positive for Staph epidermidis. Patient was then admitted to the hospital and repeat blood cultures were obtained and was started on IV vancomycin .      Assessment/Plan: Principal Problem:   Gram-positive bacteremia  Gram Positive Cocci Bacteremia Blood culture from 10/20/2024 showing Staph epidermidis in both samples.  No fevers, no source of infection,.  Patient has been started on IV daptomycin .  Follow ID recommendations.  Patient denies any implantable devices and external wounds or ulcers.Blood cultures from 10/23/2024 negative in 2 days   Renal insufficiency Left urolithiasis: Creatinine 1.53 on admission.  Improved to 1.13 on the morning of 10/21/2024.  Baseline appears to be 1.04. Imaging showed left-sided urolithiasis with large central 2.9 cm calculus.  No hydronephrosis noted.  Creatinine has improved at this time.  Lisinopril  on hold.  Metformin  has been restarted.   Lactic acidosis: Resolved   Leukocytosis: WBC has declined from 15.4 on re-admission to 11.1 on 10/25/2024.  Denies any fever.     Type 2 diabetes: Continue sliding scale insulin .   Hypertension: Continue amlodipine .  Hold lisinopril .   Hyperlipidemia: Continue atorvastatin .   Hypothyroidism: Continue Synthroid .   BPH: Continue tamsulosin .   Schizophrenia: Continue  fluphenazine .   Secondary parkinsonism: Continue benztropine .   Debility/falls. Seen by PT, OT and has been considered for assisted living facility.  Will continue home health PT OT  DVT prophylaxis: enoxaparin  (LOVENOX ) injection 40 mg Start: 10/23/24 1630   Disposition: Assisted living facility in 1 to 2 days.  Patient is from assisted living facility  Status is: Inpatient Remains inpatient appropriate because: IV antibiotic, pending clinical improvement, ID consultation    Code Status:     Code Status: Do not attempt resuscitation (DNR) PRE-ARREST INTERVENTIONS DESIRED  Family Communication: None at bedside  Consultants: Infectious disease  Procedures: None  Anti-infectives:  Daptomycin  IV  Anti-infectives (From admission, onward)    Start     Dose/Rate Route Frequency Ordered Stop   10/23/24 1700  DAPTOmycin  (CUBICIN ) IVPB 500 mg/8mL premix        6 mg/kg  90.3 kg 100 mL/hr over 30 Minutes Intravenous Daily 10/23/24 1548          Subjective: Today, patient was seen and examined at bedside.  Patient denies any fever, chills, rigors.  Denies any nausea vomiting shortness of breath or dyspnea.  Denies any urinary urgency or dysuria  Objective: Vitals:   10/25/24 0353 10/25/24 0811  BP: 136/86 138/68  Pulse: 90 89  Resp: 18 19  Temp: 98.4 F (36.9 C) 98.6 F (37 C)  SpO2: 99% 96%    Intake/Output Summary (Last 24 hours) at 10/25/2024 1046 Last data filed at 10/24/2024 2200 Gross per 24 hour  Intake --  Output 200 ml  Net -200 ml   Filed Weights   10/23/24 1640  Weight: 90.2 kg   Body mass index is 28.53 kg/m.   Physical Exam:  GENERAL: Patient is alert awake and oriented. Not in obvious distress. HENT: No scleral pallor or icterus. Pupils equally reactive to light. Oral mucosa is moist NECK: is supple, no gross swelling noted. CHEST: Clear to auscultation. No crackles or wheezes.  Diminished breath sounds bilaterally. CVS: S1 and S2 heard,  no murmur. Regular rate and rhythm.  ABDOMEN: Soft, non-tender, bowel sounds are present. EXTREMITIES: No edema. CNS: Cranial nerves are intact. No focal motor deficits. SKIN: warm and dry without rashes.  Data Review: I have personally reviewed the following laboratory data and studies,  CBC: Recent Labs  Lab 10/20/24 0937 10/20/24 1238 10/21/24 0125 10/22/24 0517 10/23/24 1611 10/24/24 0420 10/25/24 0513  WBC 20.0*  --  18.4* 15.9* 15.4* 11.8* 11.1*  NEUTROABS 17.7*  --   --  12.4* 12.4*  --  8.0*  HGB 12.5*   < > 11.1* 11.1* 12.0* 11.1* 10.9*  HCT 37.9*   < > 33.9* 32.2* 34.9* 32.9* 32.6*  MCV 89.0  --  91.1 89.0 88.8 88.0 89.3  PLT 393  --  323 296 368 345 350   < > = values in this interval not displayed.   Basic Metabolic Panel: Recent Labs  Lab 10/21/24 0125 10/22/24 0517 10/23/24 1611 10/24/24 0420 10/25/24 0513  NA 135 135 139 140 137  K 3.5 3.4* 3.7 3.4* 4.1  CL 104 103 104 106 104  CO2 18* 21* 20* 21* 22  GLUCOSE 110* 111* 141* 100* 130*  BUN 22 19 26* 22 21  CREATININE 1.13 1.08 1.18 0.94 1.00  CALCIUM  9.0 8.7* 9.5 9.1 8.9   Liver Function Tests: Recent Labs  Lab 10/23/24 1611  AST 21  ALT 18  ALKPHOS 55  BILITOT 0.4  PROT 7.1  ALBUMIN 3.9   No results for input(s): LIPASE, AMYLASE in the last 168 hours. No results for input(s): AMMONIA in the last 168 hours. Cardiac Enzymes: Recent Labs  Lab 10/24/24 0420  CKTOTAL 73   BNP (last 3 results) No results for input(s): BNP in the last 8760 hours.  ProBNP (last 3 results) No results for input(s): PROBNP in the last 8760 hours.  CBG: Recent Labs  Lab 10/21/24 1257 10/21/24 1829 10/21/24 2059 10/22/24 0804 10/22/24 1157  GLUCAP 128* 180* 134* 200* 115*   Recent Results (from the past 240 hours)  Resp panel by RT-PCR (RSV, Flu A&B, Covid) Anterior Nasal Swab     Status: None   Collection Time: 10/20/24  9:40 AM   Specimen: Anterior Nasal Swab  Result Value Ref Range  Status   SARS Coronavirus 2 by RT PCR NEGATIVE NEGATIVE Final    Comment: (NOTE) SARS-CoV-2 target nucleic acids are NOT DETECTED.  The SARS-CoV-2 RNA is generally detectable in upper respiratory specimens during the acute phase of infection. The lowest concentration of SARS-CoV-2 viral copies this assay can detect is 138 copies/mL. A negative result does not preclude SARS-Cov-2 infection and should not be used as the sole basis for treatment or other patient management decisions. A negative result may occur with  improper specimen collection/handling, submission of specimen other than nasopharyngeal swab, presence of viral mutation(s) within the areas targeted by this assay, and inadequate number of viral copies(<138 copies/mL). A negative result must be combined with clinical observations, patient history, and epidemiological information. The expected result is Negative.  Fact Sheet for Patients:  bloggercourse.com  Fact Sheet for Healthcare Providers:  seriousbroker.it  This test is no t yet approved or cleared by the [place]  [place] FDA and  has been authorized for detection and/or diagnosis of SARS-CoV-2 by FDA under an Emergency Use Authorization (EUA). This EUA will remain  in effect (meaning this test can be used) for the duration of the COVID-19 declaration under Section 564(b)(1) of the Act, 21 U.S.C.section 360bbb-3(b)(1), unless the authorization is terminated  or revoked sooner.       Influenza A by PCR NEGATIVE NEGATIVE Final   Influenza B by PCR NEGATIVE NEGATIVE Final    Comment: (NOTE) The Xpert Xpress SARS-CoV-2/FLU/RSV plus assay is intended as an aid in the diagnosis of influenza from Nasopharyngeal swab specimens and should not be used as a sole basis for treatment. Nasal washings and aspirates are unacceptable for Xpert Xpress SARS-CoV-2/FLU/RSV testing.  Fact Sheet for  Patients: bloggercourse.com  Fact Sheet for Healthcare Providers: seriousbroker.it  This test is not yet approved or cleared by the [place]  FDA and has been authorized for detection and/or diagnosis of SARS-CoV-2 by FDA under an Emergency Use Authorization (EUA). This EUA will remain in effect (meaning this test can be used) for the duration of the COVID-19 declaration under Section 564(b)(1) of the Act, 21 U.S.C. section 360bbb-3(b)(1), unless the authorization is terminated or revoked.     Resp Syncytial Virus by PCR NEGATIVE NEGATIVE Final    Comment: (NOTE) Fact Sheet for Patients: bloggercourse.com  Fact Sheet for Healthcare Providers: seriousbroker.it  This test is not yet approved or cleared by the [place]  FDA and has been authorized for detection and/or diagnosis of SARS-CoV-2 by FDA under an Emergency Use Authorization (EUA). This EUA will remain in effect (meaning this test can be used) for the duration of the COVID-19 declaration under Section 564(b)(1) of the Act, 21 U.S.C. section 360bbb-3(b)(1), unless the authorization is terminated or revoked.  Performed at [facility], [address] [place], [place] [address]   Blood culture (routine x 2)     Status: Abnormal   Collection Time: 10/20/24 12:15 PM   Specimen: BLOOD RIGHT WRIST  Result Value Ref Range Status   Specimen Description   Final    BLOOD RIGHT WRIST Performed at [facility], [address], [place], [place] [address]    Special Requests   Final    BOTTLES DRAWN AEROBIC AND ANAEROBIC Blood Culture adequate volume Performed at [facility], [address] [place], [place] [address]    Culture  Setup Time   Final    GRAM POSITIVE COCCI IN CLUSTERS IN BOTH AEROBIC AND ANAEROBIC BOTTLES CRITICAL RESULT CALLED TO, READ BACK BY AND VERIFIED WITH: PHARMD Caren A  on [id number] @1900  by SM Performed at [facility], [address], [place], [place] [address]    Culture STAPHYLOCOCCUS EPIDERMIDIS (A)  Final   Report Status 10/24/2024 FINAL  Final   Organism ID, Bacteria STAPHYLOCOCCUS EPIDERMIDIS  Final      Susceptibility   Staphylococcus epidermidis - MIC*    CIPROFLOXACIN <=0.5 SENSITIVE Sensitive     ERYTHROMYCIN <=0.25 SENSITIVE Sensitive     GENTAMICIN <=0.5 SENSITIVE Sensitive     OXACILLIN <=0.25 SENSITIVE Sensitive     TETRACYCLINE 2 SENSITIVE Sensitive     VANCOMYCIN  2 SENSITIVE Sensitive     TRIMETH/SULFA <=10 SENSITIVE Sensitive     CLINDAMYCIN 2 INTERMEDIATE Intermediate     RIFAMPIN <=0.5 SENSITIVE Sensitive     Inducible Clindamycin NEGATIVE Sensitive     * STAPHYLOCOCCUS EPIDERMIDIS  Blood Culture ID Panel (Reflexed)  Status: Abnormal   Collection Time: 10/20/24 12:15 PM  Result Value Ref Range Status   Enterococcus faecalis NOT DETECTED NOT DETECTED Final   Enterococcus Faecium NOT DETECTED NOT DETECTED Final   Listeria monocytogenes NOT DETECTED NOT DETECTED Final   Staphylococcus species DETECTED (A) NOT DETECTED Final    Comment: CRITICAL RESULT CALLED TO, READ BACK BY AND VERIFIED WITH: PHARMD Caren A on [id number] @1900  by SM    Staphylococcus aureus (BCID) NOT DETECTED NOT DETECTED Final   Staphylococcus epidermidis DETECTED (A) NOT DETECTED Final    Comment: CRITICAL RESULT CALLED TO, READ BACK BY AND VERIFIED WITH: PHARMD Caren A on [id number] @1900  by SM    Staphylococcus lugdunensis NOT DETECTED NOT DETECTED Final   Streptococcus species NOT DETECTED NOT DETECTED Final   Streptococcus agalactiae NOT DETECTED NOT DETECTED Final   Streptococcus pneumoniae NOT DETECTED NOT DETECTED Final   Streptococcus pyogenes NOT DETECTED NOT DETECTED Final   A.calcoaceticus-baumannii NOT DETECTED NOT DETECTED Final   Bacteroides fragilis NOT DETECTED NOT DETECTED Final   Enterobacterales NOT DETECTED NOT DETECTED Final    Enterobacter cloacae complex NOT DETECTED NOT DETECTED Final   Escherichia coli NOT DETECTED NOT DETECTED Final   Klebsiella aerogenes NOT DETECTED NOT DETECTED Final   Klebsiella oxytoca NOT DETECTED NOT DETECTED Final   Klebsiella pneumoniae NOT DETECTED NOT DETECTED Final   Proteus species NOT DETECTED NOT DETECTED Final   Salmonella species NOT DETECTED NOT DETECTED Final   Serratia marcescens NOT DETECTED NOT DETECTED Final   Haemophilus influenzae NOT DETECTED NOT DETECTED Final   Neisseria meningitidis NOT DETECTED NOT DETECTED Final   Pseudomonas aeruginosa NOT DETECTED NOT DETECTED Final   Stenotrophomonas maltophilia NOT DETECTED NOT DETECTED Final   Candida albicans NOT DETECTED NOT DETECTED Final   Candida auris NOT DETECTED NOT DETECTED Final   Candida glabrata NOT DETECTED NOT DETECTED Final   Candida krusei NOT DETECTED NOT DETECTED Final   Candida parapsilosis NOT DETECTED NOT DETECTED Final   Candida tropicalis NOT DETECTED NOT DETECTED Final   Cryptococcus neoformans/gattii NOT DETECTED NOT DETECTED Final   Methicillin resistance mecA/C NOT DETECTED NOT DETECTED Final    Comment: Performed at [facility], [address] [place], [place] [address]  Blood culture (routine x 2)     Status: Abnormal   Collection Time: 10/20/24 12:20 PM   Specimen: BLOOD RIGHT ARM  Result Value Ref Range Status   Specimen Description   Final    BLOOD RIGHT ARM Performed at [facility], [address] [place], [place] [address]    Special Requests   Final    BOTTLES DRAWN AEROBIC AND ANAEROBIC Blood Culture adequate volume Performed at [facility], [address] [place], [place] [address]    Culture  Setup Time   Final    GRAM POSITIVE COCCI AEROBIC BOTTLE ONLY CRITICAL RESULT CALLED TO, READ BACK BY AND VERIFIED WITH: KIERAN BILLS WITH [facility] [id number] AT 0926 BY EC PATIENT IS AT A DIFFERENT LOCATION. PHARMD JESSICA MILLEN WILL CONTACT  DISCHARGE PHYSICIAN. Performed at [facility], [address], [place], [place] [address]    Culture STAPHYLOCOCCUS EPIDERMIDIS (A)  Final   Report Status 10/25/2024 FINAL  Final   Organism ID, Bacteria STAPHYLOCOCCUS EPIDERMIDIS  Final      Susceptibility   Staphylococcus epidermidis - MIC*    CIPROFLOXACIN <=0.5 SENSITIVE Sensitive     ERYTHROMYCIN <=0.25 SENSITIVE Sensitive  GENTAMICIN <=0.5 SENSITIVE Sensitive     OXACILLIN RESISTANT Resistant     TETRACYCLINE <=1 SENSITIVE Sensitive     VANCOMYCIN  1 SENSITIVE Sensitive     TRIMETH/SULFA <=10 SENSITIVE Sensitive     CLINDAMYCIN <=0.25 SENSITIVE Sensitive     RIFAMPIN <=0.5 SENSITIVE Sensitive     Inducible Clindamycin NEGATIVE Sensitive     * STAPHYLOCOCCUS EPIDERMIDIS  Blood Culture ID Panel (Reflexed)     Status: Abnormal   Collection Time: 10/20/24 12:20 PM  Result Value Ref Range Status   Enterococcus faecalis NOT DETECTED NOT DETECTED Final   Enterococcus Faecium NOT DETECTED NOT DETECTED Final   Listeria monocytogenes NOT DETECTED NOT DETECTED Final   Staphylococcus species DETECTED (A) NOT DETECTED Final    Comment: CRITICAL RESULT CALLED TO, READ BACK BY AND VERIFIED WITH: PHARMD JESSICA MILLEN [phone] AT 0949 BY EC    Staphylococcus aureus (BCID) NOT DETECTED NOT DETECTED Final   Staphylococcus epidermidis DETECTED (A) NOT DETECTED Final    Comment: CRITICAL RESULT CALLED TO, READ BACK BY AND VERIFIED WITH: PHARMD JESSICA MILLEN [id number] AT 0949 BY EC    Staphylococcus lugdunensis NOT DETECTED NOT DETECTED Final   Streptococcus species NOT DETECTED NOT DETECTED Final   Streptococcus agalactiae NOT DETECTED NOT DETECTED Final   Streptococcus pneumoniae NOT DETECTED NOT DETECTED Final   Streptococcus pyogenes NOT DETECTED NOT DETECTED Final   A.calcoaceticus-baumannii NOT DETECTED NOT DETECTED Final   Bacteroides fragilis NOT DETECTED NOT DETECTED Final   Enterobacterales NOT DETECTED NOT DETECTED Final    Enterobacter cloacae complex NOT DETECTED NOT DETECTED Final   Escherichia coli NOT DETECTED NOT DETECTED Final   Klebsiella aerogenes NOT DETECTED NOT DETECTED Final   Klebsiella oxytoca NOT DETECTED NOT DETECTED Final   Klebsiella pneumoniae NOT DETECTED NOT DETECTED Final   Proteus species NOT DETECTED NOT DETECTED Final   Salmonella species NOT DETECTED NOT DETECTED Final   Serratia marcescens NOT DETECTED NOT DETECTED Final   Haemophilus influenzae NOT DETECTED NOT DETECTED Final   Neisseria meningitidis NOT DETECTED NOT DETECTED Final   Pseudomonas aeruginosa NOT DETECTED NOT DETECTED Final   Stenotrophomonas maltophilia NOT DETECTED NOT DETECTED Final   Candida albicans NOT DETECTED NOT DETECTED Final   Candida auris NOT DETECTED NOT DETECTED Final   Candida glabrata NOT DETECTED NOT DETECTED Final   Candida krusei NOT DETECTED NOT DETECTED Final   Candida parapsilosis NOT DETECTED NOT DETECTED Final   Candida tropicalis NOT DETECTED NOT DETECTED Final   Cryptococcus neoformans/gattii NOT DETECTED NOT DETECTED Final   Methicillin resistance mecA/C NOT DETECTED NOT DETECTED Final    Comment: Performed at [facility], [address] [place], [place] [address]  Culture, blood (Routine X 2) w Reflex to ID Panel     Status: None (Preliminary result)   Collection Time: 10/23/24  4:04 PM   Specimen: BLOOD  Result Value Ref Range Status   Specimen Description BLOOD SITE NOT SPECIFIED  Final   Special Requests   Final    BOTTLES DRAWN AEROBIC AND ANAEROBIC Blood Culture results may not be optimal due to an inadequate volume of blood received in culture bottles   Culture   Final    NO GROWTH 2 DAYS Performed at [facility], [address], [place], [place] [address]    Report Status PENDING  Incomplete  Culture, blood (Routine X 2) w Reflex to ID Panel     Status: None (Preliminary result)   Collection Time: 10/23/24  4:11 PM   Specimen: BLOOD  Result Value Ref  Range Status   Specimen Description BLOOD SITE NOT SPECIFIED  Final   Special Requests   Final    BOTTLES DRAWN AEROBIC AND ANAEROBIC Blood Culture results may not be optimal due to an inadequate volume of blood received in culture bottles   Culture   Final    NO GROWTH 2 DAYS Performed at [facility], [address] [place], [place] [address]    Report Status PENDING  Incomplete     Studies: No results found.    Disaya Walt, MD  [facility] 10/25/2024  If 7PM-7AM, please contact night-coverage             "

## 2024-10-26 LAB — CULTURE, BLOOD (ROUTINE X 2): Special Requests: ADEQUATE

## 2024-10-26 NOTE — TOC Progression Note (Addendum)
 Transition of Care [facility]) - Progression Note    Patient Details  Name: Jaime Lopez MRN: [id number] Date of Birth: 27-Nov-1950  Transition of [facility] CM/SW Contact  Bridget Cordella Simmonds, LCSW Phone Number: 10/26/2024, 10:44 AM  Clinical Narrative:   CSW spoke with Bianca/[facility] regarding pt return.  She requests FL2 and DC summary faxed to [phone].  MD informed.  1045: CSW spoke with pt regarding transportation.  Pt states he wants to return by ambulance transport, discussed that he will potentially receive a bill for this and pt states this OK, he does want ambulance transport.   Paperwork faxed.   1130: Renda confirms she has paperwork and is ready to receive pt.  CSW spoke with pt brother Nancyann regarding discharge.  He would like update from MD.  MD notified. Discussed transportation and pt stating he will pay for ambulance himself if necessary, Nancyann reports pt is going through a lot of money and he would prefer to transport, he is going to head to the hospital to discuss.      Expected Discharge Plan: Home w Home Health Services Barriers to Discharge: Continued Medical Work up               Expected Discharge Plan and Services In-house Referral: Clinical Social Work Discharge Planning Services: CM Consult Post Acute Care Choice: Durable Medical Equipment (RW) Living arrangements for the past 2 months: Assisted Living Facility Expected Discharge Date: 10/26/24                                     Social Drivers of Health (SDOH) Interventions SDOH Screenings   Food Insecurity: No Food Insecurity (10/23/2024)  Housing: Low Risk (10/23/2024)  Transportation Needs: No Transportation Needs (10/23/2024)  Utilities: Not At Risk (10/23/2024)  Social Connections: Unknown (10/23/2024)  Tobacco Use: Low Risk (10/23/2024)    Readmission Risk Interventions     No data to display

## 2024-10-26 NOTE — Progress Notes (Signed)
 Patient discharged, Important Message mailed to patient.

## 2024-10-26 NOTE — TOC Transition Note (Signed)
 Transition of Care [facility]) - Discharge Note   Patient Details  Name: Jaime Lopez MRN: [id number] Date of Birth: Nov 16, 1950  [facility] CM/SW Contact:  Bridget Cordella Simmonds, LCSW Phone Number: 10/26/2024, 11:48 AM   Clinical Narrative:   Pt discharging to [facility].  RN call report to [phone].  Brother Nancyann will transport pt, will need pt brought down to main north tower entrance.     Final next level of care: Home w Home Health Services Barriers to Discharge: Barriers Resolved   Patient Goals and CMS Choice            Discharge Placement              Patient chooses bed at:  ([facility]) Patient to be transferred to facility by: brother Nancyann Name of family member notified: brother Nancyann Patient and family notified of of transfer: 10/26/24  Discharge Plan and Services Additional resources added to the After Visit Summary for   In-house Referral: Clinical Social Work Discharge Planning Services: CM Consult Post Acute Care Choice: Durable Medical Equipment (RW)                               Social Drivers of Health (SDOH) Interventions SDOH Screenings   Food Insecurity: No Food Insecurity (10/23/2024)  Housing: Low Risk (10/23/2024)  Transportation Needs: No Transportation Needs (10/23/2024)  Utilities: Not At Risk (10/23/2024)  Social Connections: Unknown (10/23/2024)  Tobacco Use: Low Risk (10/23/2024)     Readmission Risk Interventions     No data to display

## 2024-10-26 NOTE — Progress Notes (Signed)
 Reviewed AVS to brother Nancyann, , MD follow up reviewed.   Removed IV, Site clean, dry and intact.  Patient states all belongings brought to the hospital at time of admission are accounted for and packed to take home.  [facility] [facility] contacted to transport patient to entrance A, where family member was waiting in vehicle to transport to [facility].

## 2024-10-26 NOTE — Discharge Summary (Signed)
 "  Physician Discharge Summary  Jaime Lopez FMW:[id number] DOB: 04-Sep-1951 DOA: 10/23/2024  PCP: Maritza Alm HERO, MD  Admit date: 10/23/2024 Discharge date: 10/26/2024  Admitted From: Home  Discharge disposition: Assisted living facility   Recommendations for Outpatient Follow-Up:   Follow up with your primary care provider at the assisted living facility in 1 week.  Discharge Diagnosis:   Principal Problem:   Gram-positive bacteremia   Discharge Condition: Improved.  Diet recommendation: Low sodium, heart healthy.  Wound care: None.  Code status: DNR   History of Present Illness:   Patient is a 74 years old male with past medical history of diabetes, heart failure, hyperlipidemia, hypertension, schizophrenia with recent discharge from the hospital on 10/22/2024 after a 3 day inpatient stay due to fall resulting in a posterior 6th rib fracture resulting in hemopneumothorax that was treated conservatively was discharged to assisted living facility with home PT.  On that admission, patient had leukocytosis but after discharge on 10/23/2024 patient had second bottle of blood cultures positive for Staph epidermidis. Patient was then admitted to the hospital and repeat blood cultures were obtained and was started on IV daptomycin   Hospital Course:   Following conditions were addressed during hospitalization as listed below,  Gram Positive Cocci Bacteremia Likely contaminant.  Blood culture from 10/20/2024 showing Staph epidermidis in both samples.  Discussed with pharmacy in detail and including infectious disease and at this time since patient has no signs of infection and culture reports showed sensitivity in the morning and resistance and the other very likely to be a contaminant.  After discussing with ID we will discontinue daptomycin .  Patient denies any implantable devices and external wounds or ulcers. Blood cultures from 10/23/2024 negative in 3 days.  At this time patient is  stable for disposition home.   Renal insufficiency Left urolithiasis: Creatinine 1.53 on admission.  Has improved to baseline at 1.04. Imaging showed left-sided urolithiasis with large central 2.9 cm calculus.  No hydronephrosis noted.  Creatinine has improved at this time.  Will be restarted on lisinopril  on discharge.    Lactic acidosis: Resolved   Leukocytosis: Mild.  Trended down.  No fever.    Type 2 diabetes: Continue sliding scale insulin .   Hypertension: Continue amlodipine .  Hold lisinopril .   Hyperlipidemia: Continue atorvastatin .   Hypothyroidism: Continue Synthroid .   BPH: Continue tamsulosin .   Schizophrenia: Continue fluphenazine .   Secondary parkinsonism: Continue benztropine .   Debility/falls. Seen by PT, OT and has been considered for assisted living facility.  Patient is from assisted living facility.   Disposition.  At this time, patient is stable for disposition to her assisted living facility.  Unable to reach the patient's brother on the phone.  Medical Consultants:   Infectious disease  Procedures:    None Subjective:   Today, patient was seen and examined at bedside.  Feels okay.  Denies any nausea vomiting shortness of breath fever chills or rigor.  Discharge Exam:   Vitals:   10/26/24 0405 10/26/24 0727  BP: (!) 158/87 (!) 150/91  Pulse: 72 78  Resp: 16 17  Temp: 98.3 F (36.8 C) 98.3 F (36.8 C)  SpO2: 100% 97%   Vitals:   10/25/24 1342 10/25/24 2024 10/26/24 0405 10/26/24 0727  BP: (!) 143/69 (!) 153/76 (!) 158/87 (!) 150/91  Pulse: 85 86 72 78  Resp: 18 17 16 17   Temp: 98.4 F (36.9 C) 98.5 F (36.9 C) 98.3 F (36.8 C) 98.3 F (36.8 C)  TempSrc:  Oral  SpO2: 99% 97% 100% 97%  Weight:      Height:       Body mass index is 28.53 kg/m.  General: Alert awake, not in obvious distress HENT: pupils equally reacting to light,  No scleral pallor or icterus noted. Oral mucosa is moist.  Chest:  Clear breath sounds.   Diminished breath sounds bilaterally. No crackles or wheezes.  CVS: S1 &S2 heard. No murmur.  Regular rate and rhythm. Abdomen: Soft, nontender, nondistended.  Bowel sounds are heard.   Extremities: No cyanosis, clubbing or edema.  Peripheral pulses are palpable. Psych: Alert, awake and oriented, normal mood CNS:  No cranial nerve deficits.  Power equal in all extremities.   Skin: Warm and dry.  No rashes noted.  The results of significant diagnostics from this hospitalization (including imaging, microbiology, ancillary and laboratory) are listed below for reference.     Diagnostic Studies:   No results found.   Labs:   Basic Metabolic Panel: Recent Labs  Lab 10/21/24 0125 10/22/24 0517 10/23/24 1611 10/24/24 0420 10/25/24 0513  NA 135 135 139 140 137  K 3.5 3.4* 3.7 3.4* 4.1  CL 104 103 104 106 104  CO2 18* 21* 20* 21* 22  GLUCOSE 110* 111* 141* 100* 130*  BUN 22 19 26* 22 21  CREATININE 1.13 1.08 1.18 0.94 1.00  CALCIUM  9.0 8.7* 9.5 9.1 8.9   GFR Estimated Creatinine Clearance: 75.5 mL/min (by C-G formula based on SCr of 1 mg/dL). Liver Function Tests: Recent Labs  Lab 10/23/24 1611  AST 21  ALT 18  ALKPHOS 55  BILITOT 0.4  PROT 7.1  ALBUMIN 3.9   No results for input(s): LIPASE, AMYLASE in the last 168 hours. No results for input(s): AMMONIA in the last 168 hours. Coagulation profile No results for input(s): INR, PROTIME in the last 168 hours.  CBC: Recent Labs  Lab 10/20/24 0937 10/20/24 1238 10/21/24 0125 10/22/24 0517 10/23/24 1611 10/24/24 0420 10/25/24 0513  WBC 20.0*  --  18.4* 15.9* 15.4* 11.8* 11.1*  NEUTROABS 17.7*  --   --  12.4* 12.4*  --  8.0*  HGB 12.5*   < > 11.1* 11.1* 12.0* 11.1* 10.9*  HCT 37.9*   < > 33.9* 32.2* 34.9* 32.9* 32.6*  MCV 89.0  --  91.1 89.0 88.8 88.0 89.3  PLT 393  --  323 296 368 345 350   < > = values in this interval not displayed.   Cardiac Enzymes: Recent Labs  Lab 10/24/24 0420  CKTOTAL 73    BNP: Invalid input(s): POCBNP CBG: Recent Labs  Lab 10/21/24 1257 10/21/24 1829 10/21/24 2059 10/22/24 0804 10/22/24 1157  GLUCAP 128* 180* 134* 200* 115*   D-Dimer No results for input(s): DDIMER in the last 72 hours. Hgb A1c No results for input(s): HGBA1C in the last 72 hours. Lipid Profile No results for input(s): CHOL, HDL, LDLCALC, TRIG, CHOLHDL, LDLDIRECT in the last 72 hours. Thyroid function studies No results for input(s): TSH, T4TOTAL, T3FREE, THYROIDAB in the last 72 hours.  Invalid input(s): FREET3 Anemia work up No results for input(s): VITAMINB12, FOLATE, FERRITIN, TIBC, IRON, RETICCTPCT in the last 72 hours. Microbiology Recent Results (from the past 240 hours)  Resp panel by RT-PCR (RSV, Flu A&B, Covid) Anterior Nasal Swab     Status: None   Collection Time: 10/20/24  9:40 AM   Specimen: Anterior Nasal Swab  Result Value Ref Range Status   SARS Coronavirus 2 by RT PCR NEGATIVE NEGATIVE Final  Comment: (NOTE) SARS-CoV-2 target nucleic acids are NOT DETECTED.  The SARS-CoV-2 RNA is generally detectable in upper respiratory specimens during the acute phase of infection. The lowest concentration of SARS-CoV-2 viral copies this assay can detect is 138 copies/mL. A negative result does not preclude SARS-Cov-2 infection and should not be used as the sole basis for treatment or other patient management decisions. A negative result may occur with  improper specimen collection/handling, submission of specimen other than nasopharyngeal swab, presence of viral mutation(s) within the areas targeted by this assay, and inadequate number of viral copies(<138 copies/mL). A negative result must be combined with clinical observations, patient history, and epidemiological information. The expected result is Negative.  Fact Sheet for Patients:  bloggercourse.com  Fact Sheet for Healthcare Providers:   seriousbroker.it  This test is no t yet approved or cleared by the [place]  FDA and  has been authorized for detection and/or diagnosis of SARS-CoV-2 by FDA under an Emergency Use Authorization (EUA). This EUA will remain  in effect (meaning this test can be used) for the duration of the COVID-19 declaration under Section 564(b)(1) of the Act, 21 U.S.C.section 360bbb-3(b)(1), unless the authorization is terminated  or revoked sooner.       Influenza A by PCR NEGATIVE NEGATIVE Final   Influenza B by PCR NEGATIVE NEGATIVE Final    Comment: (NOTE) The Xpert Xpress SARS-CoV-2/FLU/RSV plus assay is intended as an aid in the diagnosis of influenza from Nasopharyngeal swab specimens and should not be used as a sole basis for treatment. Nasal washings and aspirates are unacceptable for Xpert Xpress SARS-CoV-2/FLU/RSV testing.  Fact Sheet for Patients: bloggercourse.com  Fact Sheet for Healthcare Providers: seriousbroker.it  This test is not yet approved or cleared by the [place]  FDA and has been authorized for detection and/or diagnosis of SARS-CoV-2 by FDA under an Emergency Use Authorization (EUA). This EUA will remain in effect (meaning this test can be used) for the duration of the COVID-19 declaration under Section 564(b)(1) of the Act, 21 U.S.C. section 360bbb-3(b)(1), unless the authorization is terminated or revoked.     Resp Syncytial Virus by PCR NEGATIVE NEGATIVE Final    Comment: (NOTE) Fact Sheet for Patients: bloggercourse.com  Fact Sheet for Healthcare Providers: seriousbroker.it  This test is not yet approved or cleared by the [place]  FDA and has been authorized for detection and/or diagnosis of SARS-CoV-2 by FDA under an Emergency Use Authorization (EUA). This EUA will remain in effect (meaning this test can be used) for  the duration of the COVID-19 declaration under Section 564(b)(1) of the Act, 21 U.S.C. section 360bbb-3(b)(1), unless the authorization is terminated or revoked.  Performed at [facility], [address] [place], [place] [address]   Blood culture (routine x 2)     Status: Abnormal   Collection Time: 10/20/24 12:15 PM   Specimen: BLOOD RIGHT WRIST  Result Value Ref Range Status   Specimen Description   Final    BLOOD RIGHT WRIST Performed at [facility], [address] [place], [place] [address]    Special Requests   Final    BOTTLES DRAWN AEROBIC AND ANAEROBIC Blood Culture adequate volume Performed at [facility], [address] [place], [place] [address]    Culture  Setup Time   Final    GRAM POSITIVE COCCI IN CLUSTERS IN BOTH AEROBIC AND ANAEROBIC BOTTLES CRITICAL RESULT CALLED TO, READ BACK BY AND VERIFIED WITH: PHARMD Caren A on [id number] @  1900 by SM    Culture (A)  Final    STAPHYLOCOCCUS EPIDERMIDIS STAPHYLOCOCCUS WARNERI THE SIGNIFICANCE OF ISOLATING THIS ORGANISM FROM A SINGLE SET OF BLOOD CULTURES WHEN MULTIPLE SETS ARE DRAWN IS UNCERTAIN. PLEASE NOTIFY THE MICROBIOLOGY DEPARTMENT WITHIN ONE WEEK IF SPECIATION AND SENSITIVITIES ARE REQUIRED. Performed at [facility], [address], [place], [place] [address]    Report Status 10/26/2024 FINAL  Final   Organism ID, Bacteria STAPHYLOCOCCUS EPIDERMIDIS  Final      Susceptibility   Staphylococcus epidermidis - MIC*    CIPROFLOXACIN <=0.5 SENSITIVE Sensitive     ERYTHROMYCIN <=0.25 SENSITIVE Sensitive     GENTAMICIN <=0.5 SENSITIVE Sensitive     OXACILLIN <=0.25 SENSITIVE Sensitive     TETRACYCLINE 2 SENSITIVE Sensitive     VANCOMYCIN  2 SENSITIVE Sensitive     TRIMETH/SULFA <=10 SENSITIVE Sensitive     CLINDAMYCIN 2 INTERMEDIATE Intermediate     RIFAMPIN <=0.5 SENSITIVE Sensitive     Inducible Clindamycin NEGATIVE Sensitive     * STAPHYLOCOCCUS EPIDERMIDIS  Blood Culture ID  Panel (Reflexed)     Status: Abnormal   Collection Time: 10/20/24 12:15 PM  Result Value Ref Range Status   Enterococcus faecalis NOT DETECTED NOT DETECTED Final   Enterococcus Faecium NOT DETECTED NOT DETECTED Final   Listeria monocytogenes NOT DETECTED NOT DETECTED Final   Staphylococcus species DETECTED (A) NOT DETECTED Final    Comment: CRITICAL RESULT CALLED TO, READ BACK BY AND VERIFIED WITH: PHARMD Caren A on [id number] @1900  by SM    Staphylococcus aureus (BCID) NOT DETECTED NOT DETECTED Final   Staphylococcus epidermidis DETECTED (A) NOT DETECTED Final    Comment: CRITICAL RESULT CALLED TO, READ BACK BY AND VERIFIED WITH: PHARMD Caren A on [id number] @1900  by SM    Staphylococcus lugdunensis NOT DETECTED NOT DETECTED Final   Streptococcus species NOT DETECTED NOT DETECTED Final   Streptococcus agalactiae NOT DETECTED NOT DETECTED Final   Streptococcus pneumoniae NOT DETECTED NOT DETECTED Final   Streptococcus pyogenes NOT DETECTED NOT DETECTED Final   A.calcoaceticus-baumannii NOT DETECTED NOT DETECTED Final   Bacteroides fragilis NOT DETECTED NOT DETECTED Final   Enterobacterales NOT DETECTED NOT DETECTED Final   Enterobacter cloacae complex NOT DETECTED NOT DETECTED Final   Escherichia coli NOT DETECTED NOT DETECTED Final   Klebsiella aerogenes NOT DETECTED NOT DETECTED Final   Klebsiella oxytoca NOT DETECTED NOT DETECTED Final   Klebsiella pneumoniae NOT DETECTED NOT DETECTED Final   Proteus species NOT DETECTED NOT DETECTED Final   Salmonella species NOT DETECTED NOT DETECTED Final   Serratia marcescens NOT DETECTED NOT DETECTED Final   Haemophilus influenzae NOT DETECTED NOT DETECTED Final   Neisseria meningitidis NOT DETECTED NOT DETECTED Final   Pseudomonas aeruginosa NOT DETECTED NOT DETECTED Final   Stenotrophomonas maltophilia NOT DETECTED NOT DETECTED Final   Candida albicans NOT DETECTED NOT DETECTED Final   Candida auris NOT DETECTED NOT DETECTED Final   Candida  glabrata NOT DETECTED NOT DETECTED Final   Candida krusei NOT DETECTED NOT DETECTED Final   Candida parapsilosis NOT DETECTED NOT DETECTED Final   Candida tropicalis NOT DETECTED NOT DETECTED Final   Cryptococcus neoformans/gattii NOT DETECTED NOT DETECTED Final   Methicillin resistance mecA/C NOT DETECTED NOT DETECTED Final    Comment: Performed at [facility], [address], [place], [place] [address]  Blood culture (routine x 2)     Status: Abnormal   Collection Time: 10/20/24 12:20 PM   Specimen: BLOOD  RIGHT ARM  Result Value Ref Range Status   Specimen Description   Final    BLOOD RIGHT ARM Performed at [facility], [address], [place], [place] [address]    Special Requests   Final    BOTTLES DRAWN AEROBIC AND ANAEROBIC Blood Culture adequate volume Performed at [facility], [address] [place], [place] [address]    Culture  Setup Time   Final    GRAM POSITIVE COCCI AEROBIC BOTTLE ONLY CRITICAL RESULT CALLED TO, READ BACK BY AND VERIFIED WITH: KIERAN BILLS WITH [facility] [id number] AT 0926 BY EC PATIENT IS AT A DIFFERENT LOCATION. PHARMD JESSICA MILLEN WILL CONTACT DISCHARGE PHYSICIAN. Performed at [facility], [address] [place], [place] [address]    Culture STAPHYLOCOCCUS EPIDERMIDIS (A)  Final   Report Status 10/25/2024 FINAL  Final   Organism ID, Bacteria STAPHYLOCOCCUS EPIDERMIDIS  Final      Susceptibility   Staphylococcus epidermidis - MIC*    CIPROFLOXACIN <=0.5 SENSITIVE Sensitive     ERYTHROMYCIN <=0.25 SENSITIVE Sensitive     GENTAMICIN <=0.5 SENSITIVE Sensitive     OXACILLIN RESISTANT Resistant     TETRACYCLINE <=1 SENSITIVE Sensitive     VANCOMYCIN  1 SENSITIVE Sensitive     TRIMETH/SULFA <=10 SENSITIVE Sensitive     CLINDAMYCIN <=0.25 SENSITIVE Sensitive     RIFAMPIN <=0.5 SENSITIVE Sensitive     Inducible Clindamycin NEGATIVE Sensitive     * STAPHYLOCOCCUS EPIDERMIDIS  Blood Culture ID Panel  (Reflexed)     Status: Abnormal   Collection Time: 10/20/24 12:20 PM  Result Value Ref Range Status   Enterococcus faecalis NOT DETECTED NOT DETECTED Final   Enterococcus Faecium NOT DETECTED NOT DETECTED Final   Listeria monocytogenes NOT DETECTED NOT DETECTED Final   Staphylococcus species DETECTED (A) NOT DETECTED Final    Comment: CRITICAL RESULT CALLED TO, READ BACK BY AND VERIFIED WITH: PHARMD JESSICA MILLEN [id number] AT 0949 BY EC    Staphylococcus aureus (BCID) NOT DETECTED NOT DETECTED Final   Staphylococcus epidermidis DETECTED (A) NOT DETECTED Final    Comment: CRITICAL RESULT CALLED TO, READ BACK BY AND VERIFIED WITH: PHARMD JESSICA MILLEN [id number] AT 0949 BY EC    Staphylococcus lugdunensis NOT DETECTED NOT DETECTED Final   Streptococcus species NOT DETECTED NOT DETECTED Final   Streptococcus agalactiae NOT DETECTED NOT DETECTED Final   Streptococcus pneumoniae NOT DETECTED NOT DETECTED Final   Streptococcus pyogenes NOT DETECTED NOT DETECTED Final   A.calcoaceticus-baumannii NOT DETECTED NOT DETECTED Final   Bacteroides fragilis NOT DETECTED NOT DETECTED Final   Enterobacterales NOT DETECTED NOT DETECTED Final   Enterobacter cloacae complex NOT DETECTED NOT DETECTED Final   Escherichia coli NOT DETECTED NOT DETECTED Final   Klebsiella aerogenes NOT DETECTED NOT DETECTED Final   Klebsiella oxytoca NOT DETECTED NOT DETECTED Final   Klebsiella pneumoniae NOT DETECTED NOT DETECTED Final   Proteus species NOT DETECTED NOT DETECTED Final   Salmonella species NOT DETECTED NOT DETECTED Final   Serratia marcescens NOT DETECTED NOT DETECTED Final   Haemophilus influenzae NOT DETECTED NOT DETECTED Final   Neisseria meningitidis NOT DETECTED NOT DETECTED Final   Pseudomonas aeruginosa NOT DETECTED NOT DETECTED Final   Stenotrophomonas maltophilia NOT DETECTED NOT DETECTED Final   Candida albicans NOT DETECTED NOT DETECTED Final   Candida auris NOT DETECTED NOT DETECTED Final    Candida glabrata NOT DETECTED NOT DETECTED Final   Candida krusei NOT DETECTED NOT DETECTED Final  Candida parapsilosis NOT DETECTED NOT DETECTED Final   Candida tropicalis NOT DETECTED NOT DETECTED Final   Cryptococcus neoformans/gattii NOT DETECTED NOT DETECTED Final   Methicillin resistance mecA/C NOT DETECTED NOT DETECTED Final    Comment: Performed at [facility], [address] [place], [place] [address]  Culture, blood (Routine X 2) w Reflex to ID Panel     Status: None (Preliminary result)   Collection Time: 10/23/24  4:04 PM   Specimen: BLOOD  Result Value Ref Range Status   Specimen Description BLOOD SITE NOT SPECIFIED  Final   Special Requests   Final    BOTTLES DRAWN AEROBIC AND ANAEROBIC Blood Culture results may not be optimal due to an inadequate volume of blood received in culture bottles   Culture   Final    NO GROWTH 3 DAYS Performed at [facility], [address], [place], [place] [address]    Report Status PENDING  Incomplete  Culture, blood (Routine X 2) w Reflex to ID Panel     Status: None (Preliminary result)   Collection Time: 10/23/24  4:11 PM   Specimen: BLOOD  Result Value Ref Range Status   Specimen Description BLOOD SITE NOT SPECIFIED  Final   Special Requests   Final    BOTTLES DRAWN AEROBIC AND ANAEROBIC Blood Culture results may not be optimal due to an inadequate volume of blood received in culture bottles   Culture   Final    NO GROWTH 3 DAYS Performed at [facility], [address], [place], [place] [address]    Report Status PENDING  Incomplete     Discharge Instructions:   Discharge Instructions     Call MD for:  temperature >100.4   Complete by: As directed    Diet - low sodium heart healthy   Complete by: As directed    Discharge instructions   Complete by: As directed    Follow-up with your primary care provider in 1 to 2 weeks.  Check blood work at that time.  Seek medical attention for worsening symptoms.    Increase activity slowly   Complete by: As directed       Allergies as of 10/26/2024   No Known Allergies      Medication List     TAKE these medications    benztropine  0.5 MG tablet Commonly known as: COGENTIN  Take 1 tablet (0.5 mg total) by mouth every other day. What changed: when to take this The timing of this medication is very important.   amLODipine  5 MG tablet Commonly known as: NORVASC  Take 5 mg by mouth at bedtime.   atorvastatin  10 MG tablet Commonly known as: LIPITOR Take 10 mg by mouth every evening.   cholecalciferol  25 MCG (1000 UNIT) tablet Commonly known as: VITAMIN D3 Take 1,000 Units by mouth daily.   fluPHENAZine  2.5 MG tablet Commonly known as: PROLIXIN  Take 2.5 mg by mouth daily.   Januvia 100 MG tablet Generic drug: sitaGLIPtin Take 100 mg by mouth daily.   levothyroxine  75 MCG tablet Commonly known as: SYNTHROID  Take 75 mcg by mouth in the morning.   lidocaine  5 % Commonly known as: LIDODERM  Place 2 patches onto the skin daily. Remove & Discard patch within 12 hours or as directed by MD What changed:  how much to take additional instructions   lisinopril  40 MG tablet Commonly known as: ZESTRIL  Take 40 mg by mouth daily.   metFORMIN  1000 MG tablet Commonly known  as: GLUCOPHAGE  Take 1,000 mg by mouth 2 (two) times daily.   methocarbamol  500 MG tablet Commonly known as: ROBAXIN  Take 1 tablet (500 mg total) by mouth every 8 (eight) hours as needed for muscle spasms.   psyllium 0.52 g capsule Commonly known as: REGULOID Take 0.52 g by mouth at bedtime.   sennosides-docusate sodium  8.6-50 MG tablet Commonly known as: SENOKOT-S Take 1 tablet by mouth at bedtime.   tamsulosin  0.4 MG Caps capsule Commonly known as: FLOMAX  Take 0.4 mg by mouth every evening.   traZODone  50 MG tablet Commonly known as: DESYREL  Take 50 mg by mouth at bedtime as needed for sleep.          Time coordinating discharge: 39  minutes  Signed:  Samoria Fedorko  [facility] 10/26/2024, 9:51 AM          "

## 2024-10-26 NOTE — Plan of Care (Signed)

## 2024-10-26 NOTE — [place] FL2 (Signed)
 " [place]  MEDICAID FL2 LEVEL OF CARE FORM     IDENTIFICATION  Patient Name: Jaime Lopez Birthdate: December 19, 1950 Sex: male Admission Date (Current Location): 10/23/2024  [facility] and [place] Number:  Producer, Television/film/video and Address:         Provider Number: [id number]  Attending Physician Name and Address:  Sonjia Held, MD  Relative Name and Phone Number:       Current Level of Care: Hospital Recommended Level of Care: Assisted Living Facility Prior Approval Number:    Date Approved/Denied:   PASRR Number:    Discharge Plan: Other (Comment) [facility])    Current Diagnoses: Patient Active Problem List   Diagnosis Date Noted   Gram-positive bacteremia 10/23/2024   Closed traumatic fracture of ribs of left side with pneumothorax 10/20/2024   Renal insufficiency 10/20/2024   Hypothyroidism 10/20/2024   BPH (benign prostatic hyperplasia) 10/20/2024   COVID-19 virus infection 06/08/2020   Generalized weakness 06/08/2020   Sepsis (HCC) 11/16/2016   Insulin  dependent type 2 diabetes mellitus (HCC) 11/16/2016   Hyperlipidemia associated with type 2 diabetes mellitus (HCC) 11/16/2016   Hypertension associated with diabetes (HCC) 11/16/2016   Obesity, Class II, BMI 35-39.9, with comorbidity 11/16/2016   Schizophrenia (HCC) 11/16/2016   Syncope 11/16/2016   Diarrhea 11/16/2016   Chronic bilateral low back pain without sciatica 02/21/2016    Orientation RESPIRATION BLADDER Height & Weight     Self, Time, Situation  Normal Continent Weight: 90.2 kg Height:  5' 10 (177.8 cm)  BEHAVIORAL SYMPTOMS/MOOD NEUROLOGICAL BOWEL NUTRITION STATUS      Continent Diet (REGULAR DIET, refer to d/c summary)  AMBULATORY STATUS COMMUNICATION OF NEEDS Skin   Limited Assist Verbally Normal                       Personal Care Assistance Level of Assistance  Bathing, Feeding, Dressing, Total care Bathing Assistance: Limited assistance Feeding assistance:  Independent Dressing Assistance: Limited assistance Total Care Assistance: Limited assistance   Functional Limitations Info  Sight, Hearing, Speech Sight Info: Adequate Hearing Info: Adequate Speech Info: Adequate    SPECIAL CARE FACTORS FREQUENCY                       Contractures Contractures Info: Not present    Additional Factors Info  Code Status, Allergies Code Status Info: DNR Allergies Info: No Known Allergies           Current Medications (10/26/2024):  This is the current hospital active medication list Current Facility-Administered Medications  Medication Dose Route Frequency Provider Last Rate Last Admin   acetaminophen  (TYLENOL ) tablet 650 mg  650 mg Oral Q6H PRN Swayze, Ava, DO       Or   acetaminophen  (TYLENOL ) suppository 650 mg  650 mg Rectal Q6H PRN Swayze, Ava, DO       amLODipine  (NORVASC ) tablet 5 mg  5 mg Oral Daily Swayze, Ava, DO   5 mg at 10/26/24 0931   atorvastatin  (LIPITOR) tablet 10 mg  10 mg Oral Daily Swayze, Ava, DO   10 mg at 10/26/24 0931   benztropine  (COGENTIN ) tablet 0.5 mg  0.5 mg Oral QODAY Swayze, Ava, DO   0.5 mg at 10/26/24 0931   cholecalciferol  (VITAMIN D3) 25 MCG (1000 UNIT) tablet 1,000 Units  1,000 Units Oral Daily Swayze, Ava, DO   1,000 Units at 10/26/24 0931   DAPTOmycin  (CUBICIN ) IVPB 500 mg/26mL premix  6 mg/kg Intravenous Q1400 Swayze,  Ava, DO 100 mL/hr at 10/25/24 1629 500 mg at 10/25/24 1629   enoxaparin  (LOVENOX ) injection 40 mg  40 mg Subcutaneous Q24H Swayze, Ava, DO   40 mg at 10/25/24 1627   fluPHENAZine  (PROLIXIN ) tablet 2.5 mg  2.5 mg Oral Daily Swayze, Ava, DO   2.5 mg at 10/26/24 0930   levothyroxine  (SYNTHROID ) tablet 75 mcg  75 mcg Oral Q0600 Swayze, Ava, DO   75 mcg at 10/26/24 0505   lidocaine  (LIDODERM ) 5 % 1 patch  1 patch Transdermal Q24H Swayze, Ava, DO   1 patch at 10/24/24 1009   metFORMIN  (GLUCOPHAGE ) tablet 1,000 mg  1,000 mg Oral BID WC Swayze, Ava, DO   1,000 mg at 10/26/24 0931    methocarbamol  (ROBAXIN ) tablet 500 mg  500 mg Oral Q8H PRN Swayze, Ava, DO       ondansetron  (ZOFRAN ) tablet 4 mg  4 mg Oral Q6H PRN Swayze, Ava, DO       Or   ondansetron  (ZOFRAN ) injection 4 mg  4 mg Intravenous Q6H PRN Swayze, Ava, DO       psyllium (HYDROCIL/METAMUCIL) 1 packet  1 packet Oral Daily Swayze, Ava, DO   1 packet at 10/26/24 0931   senna-docusate (Senokot-S) tablet 1 tablet  1 tablet Oral QHS Swayze, Ava, DO   1 tablet at 10/25/24 2124   tamsulosin  (FLOMAX ) capsule 0.4 mg  0.4 mg Oral QPC supper Swayze, Ava, DO   0.4 mg at 10/25/24 1745   traZODone  (DESYREL ) tablet 50 mg  50 mg Oral QHS Swayze, Ava, DO   50 mg at 10/25/24 2124     Discharge Medications: Medication List       TAKE these medications     benztropine  0.5 MG tablet Commonly known as: COGENTIN  Take 1 tablet (0.5 mg total) by mouth every other day. What changed: when to take this The timing of this medication is very important.    amLODipine  5 MG tablet Commonly known as: NORVASC  Take 5 mg by mouth at bedtime.    atorvastatin  10 MG tablet Commonly known as: LIPITOR Take 10 mg by mouth every evening.    cholecalciferol  25 MCG (1000 UNIT) tablet Commonly known as: VITAMIN D3 Take 1,000 Units by mouth daily.    fluPHENAZine  2.5 MG tablet Commonly known as: PROLIXIN  Take 2.5 mg by mouth daily.    Januvia 100 MG tablet Generic drug: sitaGLIPtin Take 100 mg by mouth daily.    levothyroxine  75 MCG tablet Commonly known as: SYNTHROID  Take 75 mcg by mouth in the morning.    lidocaine  5 % Commonly known as: LIDODERM  Place 2 patches onto the skin daily. Remove & Discard patch within 12 hours or as directed by MD What changed:  how much to take additional instructions    lisinopril  40 MG tablet Commonly known as: ZESTRIL  Take 40 mg by mouth daily.    metFORMIN  1000 MG tablet Commonly known as: GLUCOPHAGE  Take 1,000 mg by mouth 2 (two) times daily.    methocarbamol  500 MG tablet Commonly  known as: ROBAXIN  Take 1 tablet (500 mg total) by mouth every 8 (eight) hours as needed for muscle spasms.    psyllium 0.52 g capsule Commonly known as: REGULOID Take 0.52 g by mouth at bedtime.    sennosides-docusate sodium  8.6-50 MG tablet Commonly known as: SENOKOT-S Take 1 tablet by mouth at bedtime.    tamsulosin  0.4 MG Caps capsule Commonly known as: FLOMAX  Take 0.4 mg by mouth every evening.  traZODone  50 MG tablet Commonly known as: DESYREL  Take 50 mg by mouth at bedtime as needed for sleep.               Please see discharge summary for a list of discharge medications.  Relevant Imaging Results:  Relevant Lab Results:   Additional Information [phone] [place]     "

## 2024-10-26 NOTE — Care Management Important Message (Signed)
 Important Message  Patient Details  Name: Jaime Lopez MRN: [id number] Date of Birth: 1950/12/03   Important Message Given:  No     [name] 10/26/2024, 4:37 PM

## 2024-10-28 LAB — CULTURE, BLOOD (ROUTINE X 2)
Culture: NO GROWTH
Culture: NO GROWTH
